# Patient Record
Sex: Female | Born: 1990 | Race: Black or African American | Hispanic: No | Marital: Single | Smoking: Current every day smoker
Health system: Southern US, Community
[De-identification: ages and names within clinical notes are randomized; demographics above are authoritative.]

## PROBLEM LIST (undated history)

## (undated) DIAGNOSIS — B379 Candidiasis, unspecified: Secondary | ICD-10-CM

## (undated) DIAGNOSIS — A599 Trichomoniasis, unspecified: Secondary | ICD-10-CM

## (undated) DIAGNOSIS — K859 Acute pancreatitis without necrosis or infection, unspecified: Secondary | ICD-10-CM

## (undated) DIAGNOSIS — O139 Gestational [pregnancy-induced] hypertension without significant proteinuria, unspecified trimester: Secondary | ICD-10-CM

## (undated) DIAGNOSIS — A6000 Herpesviral infection of urogenital system, unspecified: Secondary | ICD-10-CM

## (undated) DIAGNOSIS — E669 Obesity, unspecified: Secondary | ICD-10-CM

## (undated) DIAGNOSIS — O1410 Severe pre-eclampsia, unspecified trimester: Secondary | ICD-10-CM

## (undated) DIAGNOSIS — Z8619 Personal history of other infectious and parasitic diseases: Secondary | ICD-10-CM

## (undated) DIAGNOSIS — I10 Essential (primary) hypertension: Secondary | ICD-10-CM

## (undated) HISTORY — DX: Herpesviral infection of urogenital system, unspecified: A60.00

## (undated) HISTORY — DX: Personal history of other infectious and parasitic diseases: Z86.19

## (undated) HISTORY — DX: Candidiasis, unspecified: B37.9

## (undated) HISTORY — DX: Trichomoniasis, unspecified: A59.9

---
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
## 2008-01-28 ENCOUNTER — Encounter: Payer: Self-pay | Admitting: Family Medicine

## 2008-01-28 ENCOUNTER — Ambulatory Visit: Payer: Self-pay | Admitting: Family Medicine

## 2008-01-28 DIAGNOSIS — I10 Essential (primary) hypertension: Secondary | ICD-10-CM

## 2008-01-28 LAB — CONVERTED CEMR LAB: Hgb A1c MFr Bld: 5.8 %

## 2008-01-29 ENCOUNTER — Ambulatory Visit: Payer: Self-pay | Admitting: Family Medicine

## 2008-01-29 LAB — CONVERTED CEMR LAB
AST: 11 units/L (ref 0–37)
Albumin: 4.6 g/dL (ref 3.5–5.2)
Alkaline Phosphatase: 88 units/L (ref 39–117)
Calcium: 9.6 mg/dL (ref 8.4–10.5)
Chloride: 105 meq/L (ref 96–112)
Glucose, Bld: 90 mg/dL (ref 70–99)
Potassium: 4.2 meq/L (ref 3.5–5.3)
Sodium: 142 meq/L (ref 135–145)
Total Protein: 7.8 g/dL (ref 6.0–8.3)

## 2008-02-02 ENCOUNTER — Telehealth: Payer: Self-pay | Admitting: Family Medicine

## 2008-11-22 ENCOUNTER — Emergency Department (HOSPITAL_COMMUNITY): Admission: EM | Admit: 2008-11-22 | Discharge: 2008-11-22 | Payer: Self-pay | Admitting: Family Medicine

## 2009-02-19 ENCOUNTER — Encounter (INDEPENDENT_AMBULATORY_CARE_PROVIDER_SITE_OTHER): Payer: Self-pay | Admitting: *Deleted

## 2009-02-19 DIAGNOSIS — F17200 Nicotine dependence, unspecified, uncomplicated: Secondary | ICD-10-CM | POA: Insufficient documentation

## 2010-01-03 ENCOUNTER — Emergency Department (HOSPITAL_COMMUNITY): Admission: EM | Admit: 2010-01-03 | Discharge: 2010-01-03 | Payer: Self-pay | Admitting: Family Medicine

## 2010-01-18 ENCOUNTER — Ambulatory Visit: Payer: Self-pay | Admitting: Family Medicine

## 2010-02-01 ENCOUNTER — Telehealth: Payer: Self-pay | Admitting: Family Medicine

## 2010-03-29 ENCOUNTER — Telehealth: Payer: Self-pay | Admitting: Family Medicine

## 2010-04-12 ENCOUNTER — Encounter: Payer: Self-pay | Admitting: Family Medicine

## 2010-04-17 ENCOUNTER — Telehealth: Payer: Self-pay | Admitting: Family Medicine

## 2010-05-05 ENCOUNTER — Ambulatory Visit: Payer: Self-pay | Admitting: Family Medicine

## 2010-05-05 ENCOUNTER — Encounter: Payer: Self-pay | Admitting: Family Medicine

## 2010-05-05 LAB — CONVERTED CEMR LAB
AST: 11 units/L (ref 0–37)
Alkaline Phosphatase: 82 units/L (ref 39–117)
BUN: 9 mg/dL (ref 6–23)
Glucose, Bld: 97 mg/dL (ref 70–99)
Hemoglobin: 13.5 g/dL (ref 12.0–15.0)
MCHC: 32.8 g/dL (ref 30.0–36.0)
MCV: 95.8 fL (ref 78.0–100.0)
RBC: 4.29 M/uL (ref 3.87–5.11)
Sodium: 139 meq/L (ref 135–145)
Total Bilirubin: 0.4 mg/dL (ref 0.3–1.2)

## 2010-06-04 DIAGNOSIS — O1410 Severe pre-eclampsia, unspecified trimester: Secondary | ICD-10-CM

## 2010-06-04 HISTORY — DX: Severe pre-eclampsia, unspecified trimester: O14.10

## 2010-06-19 ENCOUNTER — Telehealth: Payer: Self-pay | Admitting: Family Medicine

## 2010-06-19 DIAGNOSIS — A6000 Herpesviral infection of urogenital system, unspecified: Secondary | ICD-10-CM | POA: Insufficient documentation

## 2010-06-28 ENCOUNTER — Ambulatory Visit: Admit: 2010-06-28 | Payer: Self-pay

## 2010-07-04 NOTE — Progress Notes (Signed)
Summary: refill Acyclovir   Phone Note Refill Request Call back at [phone]   Refills Requested: Medication #1:  ACYCLOVIR 400 MG TABS 1 tab by mouth three times a day  5 daysfor recurrence. Need refill on meds.  [facility]  Initial call taken by: Abundio Miu,  April 17, 2010 9:20 AM  Follow-up for Phone Call        Spoke with pt.  Discussed that I will send Rx in for this time but I would like to see her in the clinic.  We should discuss 1)suppresive therapy 2)why she is getting so many outbreaks, work up for immunocomp.  Pt agreed to make appt to see me, even if she does not have outbreak at time of appt.  Follow-up by: Cat Ta MD,  April 17, 2010 10:07 AM    Prescriptions: ACYCLOVIR 400 MG TABS (ACYCLOVIR) 1 tab by mouth three times a day  5 daysfor recurrence  #15 x 0   Entered and Authorized by:   Angeline Slim MD   Signed by:   Angeline Slim MD on 04/17/2010   Method used:   Faxed to ...       [facility] Department (retail)       [address] [place], [place]  [address]       Ph: [phone]       Fax: [phone]   RxID:   [id number]

## 2010-07-04 NOTE — Assessment & Plan Note (Signed)
Summary: f/u,df   Vital Signs:  Patient profile:   20 year old female Height:      69.25 inches Weight:      340 pounds BMI:     50.03 Temp:     98.0 degrees F oral BP sitting:   122 / 80  (left arm) Cuff size:   large  Vitals Entered By: Tessie Fass CMA (January 18, 2010 9:06 AM) CC: F/U Is Patient Diabetic? No Pain Assessment Patient in pain? no        Primary Care Provider:  Cat Ta MD  CC:  F/U.  History of Present Illness: 58 y/io F here for herpes  Was seen at Urgent Care 2 wks ago for a boil in vaginal area.  She was told that she had herpes and was given a Rx for valtrex.  She tested her for gonrrhea, chlamydia and they were negative. She has discussed with her partner and she is no longer having sex with this man.    Looking in Echart  Chlam -, GC -, RPR -, HIV -, Trich+, yeast+ She was given flagyl 1000mg  daily for two days but she did not take this. She was given Diflucan for yeast infection and she took this.  Denies anymore outbreaks. NO more symptoms of pain, irritation, discharge, fever, chills, n/v, abd pain.    Habits & Providers  Alcohol-Tobacco-Diet     Tobacco Status: current     Tobacco Counseling: to quit use of tobacco products     Cigarette Packs/Day: 0.5  Current Medications (verified): 1)  Flagyl 500 Mg Tabs (Metronidazole) .... Take Total of 4 Tablets By Mouth One Time. (You Already 2 Pills At Home) 2)  Valtrex 1 Gm Tabs (Valacyclovir Hcl) .Marland Kitchen.. 1 Tab By Mouth Three Times A Day X 7 Days At Next Outbreak  Allergies (verified): No Known Drug Allergies  Past History:  Family History: Last updated: 08/01/2006 Mother - DM, Sister - Asthma  Social History: Last updated: 08/28/2007 Graduated from [employer] in 2008.  Plans to go to [employer] school.  Lives with mother (Sarmi Pleasant), sister France Ravens Pleasant), and brother Gabriellia Rempel).1/2 pck per day /ETOH/drugs.  Sexually active since age 26  Risk Factors: Smoking Status:  current (01/18/2010) Packs/Day: 0.5 (01/18/2010)  Past Medical History: chlamydia 6/08 trichomoniasis 6/08 , 01/2010  Herpes 01/2010  Social History: Packs/Day:  0.5  Review of Systems       per hpi  Physical Exam  General:  Well-developed,well-nourished,in no acute distress; alert,appropriate and cooperative throughout examination. vitals reviewed.    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  I've sent rx to her pharmacy to have at hand for next outbreak. We discussed STDs and ways to protect herself.   Orders: [facility]- Est Level  3 ([address]644)  Problem # 2:  TRICHOMONIASIS (ICD-131.9) Assessment: New  Did not take all of flagyl.  Has flagyl 1000mg  left.  I've sent Rx for flagyl 1000mg  and instructed pt to take 2000mg  at one time.  Orders: [facility]- Est Level  3 (03474)  Problem # 3:  HIGH-RISK SEXUAL BEHAVIOR (ICD-V69.2) Assessment: Comment Only She will need exam for pap and contraceptive management.   Complete Medication List: 1)  Flagyl 500 Mg Tabs (Metronidazole) .... Take total of 4 tablets by mouth one time. (you already 2 pills at home) 2)  Valtrex 1 Gm Tabs (Valacyclovir hcl) .Marland Kitchen.. 1 tab by mouth three times a  day x 7 days at next outbreak  Patient Instructions: 1)  Please schedule an appointment with your primary doctor in 2-3 weeks for pap. 2)  Metronidazole (Flagyle): take 4 tablets today at one time 3)  Valtrex: take with next outbreak.  Call me if you have another outbreak.   Prescriptions: VALTREX 1 GM TABS (VALACYCLOVIR HCL) 1 tab by mouth three times a day x 7 days at next outbreak  #21 x 0   Entered and Authorized by:   Angeline Slim MD   Signed by:   Angeline Slim MD on 01/18/2010   Method used:   Faxed to ...       [facility] Department (retail)       [address] [place], [place]  [address]       Ph: [phone]       Fax: [phone]   RxID:    [id number] FLAGYL 500 MG TABS (METRONIDAZOLE) take total of 4 tablets by mouth one time. (you already 2 pills at home)  #2 x 0   Entered and Authorized by:   Angeline Slim MD   Signed by:   Angeline Slim MD on 01/18/2010   Method used:   Faxed to ...       [facility] Department (retail)       [address] [place], [place]  [address]       Ph: [phone]       Fax: [phone]   RxID:   [id number]

## 2010-07-04 NOTE — Progress Notes (Signed)
Summary: refill   Phone Note Refill Request Call back at Home Phone [phone] Message from:  Patient  Refills Requested: Medication #1:  ACYCLOVIR 400 MG TABS 1 tab by mouth three times a day  5 daysfor recurrence. [facility]  Initial call taken by: De Nurse,  March 29, 2010 9:21 AM    Prescriptions: ACYCLOVIR 400 MG TABS (ACYCLOVIR) 1 tab by mouth three times a day  5 daysfor recurrence  #15 x 0   Entered and Authorized by:   Angeline Slim MD   Signed by:   Angeline Slim MD on 03/29/2010   Method used:   Faxed to ...       [facility] Department (retail)       [address] [place], [place]  [address]       Ph: [phone]       Fax: [phone]   RxID:   [id number]

## 2010-07-04 NOTE — Assessment & Plan Note (Signed)
Summary: BP, obesity    Vital Signs:  Patient profile:   20 year old female Height:      69.25 inches Weight:      337.50 pounds BMI:     49.66 BSA:     2.59 Temp:     98.8 degrees F Pulse rate:   75 / minute BP sitting:   140 / 81  Vitals Entered By: Jone Baseman CMA (May 05, 2010 9:05 AM) CC: f/u elevated BP Is Patient Diabetic? No Pain Assessment Patient in pain? no        Primary Care Provider:  Cat Ta MD  CC:  f/u elevated BP.  History of Present Illness: 20 y/o F here for elevated BP, obesity, ?diabetes.  Elevated BP:  she has had elevated BP in past to 140s-150s, then she would have BP in the normal range 120s.  She is not taking any meds for HTN at this time.   No headache, chest pain, palpitations, no dyspnea, no claudication, no lightheadedness  DIABETES:  Pt is very concerned about diabetes.  She is morbidly obese and has a strong family history of DM.  Mother, mat grandma, mat aunts, sister all with diabetes.  No polyuria, polydipsia, no dysuria, abd pain, lightheadedness, no history of blood work showing elevated random glucose.    OBESITY BMI:  49.6 Weight difference since last OV: - 2 lbs Exercise:none        How often:       Diet: eats one meal a day, dinner.  She goes to school and would come home to do homework and sleep.  She has an AbLounger, but is not using it.   Starting gaining grade in 8-9th grade because she started Depo.  She has been in the 300lb range for so long, that she cannot recall how much weight she is gaining on average.    Tobacco use: 1/2 ppd.  Advised cessation.   Habits & Providers  Alcohol-Tobacco-Diet     Tobacco Status: current     Tobacco Counseling: to quit use of tobacco products     Cigarette Packs/Day: 0.5  Current Medications (verified): 1)  Acyclovir 400 Mg Tabs (Acyclovir) .Marland Kitchen.. 1 Tab By Mouth Three Times A Day  5 Daysfor Recurrence  Allergies (verified): No Known Drug Allergies  Past  History:  Family History: Last updated: 05/05/2010 Mother - DM, obesity Sister - Asthma, obesity Mat aunt: DM Mat grandmother: DM  Social History: Last updated: 05/05/2010 Graduated from [employer] in 2008.   Lives with mother (Sarmi Pleasant), sister France Ravens Pleasant), and brother Yarethzy Croak).1/2 pck per day /ETOH/drugs.  Sexually active since age 45  School: [employer], studying criminal justice.   Risk Factors: Smoking Status: current (05/05/2010) Packs/Day: 0.5 (05/05/2010)  Past Medical History: -chlamydia 6/08 -trichomoniasis 6/08 , 01/2010  -Herpes 01/2010 -Risky sexual behavior -Obesity -Elevated BP  Family History: Mother - DM, obesity Sister - Asthma, obesity Mat aunt: DM Mat grandmother: DM  Social History: Graduated from [employer] in 2008.   Lives with mother (Sarmi Pleasant), sister France Ravens Pleasant), and brother Crystalmarie Yasin).1/2 pck per day /ETOH/drugs.  Sexually active since age 40  School: [employer], studying criminal justice.   Review of Systems       per hpi   Physical Exam  General:  Well-developed,well-nourished,in no acute distress; alert,appropriate and cooperative throughout examination. vitals reviewed.  Lungs:  Normal respiratory effort, chest expands symmetrically. Lungs are clear to auscultation, no crackles or wheezes. Heart:  Normal rate and regular rhythm. S1 and S2 normal without gallop, murmur, click, rub or other extra sounds. Abdomen:  Bowel sounds positive,abdomen soft and non-tender without masses, organomegaly or hernias noted. obese  Pulses:  + 2 bilaterally  Extremities:  No clubbing, cyanosis, edema, or deformity noted with normal full range of motion of all joints.   Neurologic:  alert & oriented X3 and cranial nerves II-XII intact.   Psych:  Oriented X3, memory intact for recent and remote, normally interactive, good eye contact, not anxious appearing, not depressed appearing, and not agitated.     Impression &  Recommendations:  Problem # 1:  ELEVATED BLOOD PRESSURE WITHOUT DIAGNOSIS OF HYPERTENSION (ICD-796.2) Assessment Deteriorated BPs have ranged from 120s - 150s, but pt has been here very infrequently.  She is also young and morbidly obese.  I've asked her to return for nurse visits for BP checks.  Ideally pt would lose weight, which would help her BP, but given that BMI is 49.66 I don't know how much weight she will need to lose to lower BP into normal range.  Orders: Comp Met-[facility] ([id number]) CBC-[facility] ([address]) [facility]- Est  Level 4 (914[address])  Problem # 2:  OBESITY, NOS (ICD-278.00) Assessment: Unchanged Pt has weighed > 300 lbs since 2008.  Her mother and sister are also obese.  We spent a lot of time today discussing eating breakfast to jump start metabolism in the morning.  Discussed that eating 3 small meals per day is better than 1 large meal at night.  I've given pt handout on counting calories and also handout on a typical menu for 1800 calories per day.  We also discussed exercise.  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  Discussed playing physical/sports games on the Wii, like boxing or tennis.  I am trying to encourage her to get more active.  She was actually very excited about the boxing game, where she will expand a lot of energy and will move around a lot. I gave an example of a personal friend of mine who lost 50 lbs playing the boxing Wii game.  Pt was motivated by this.  She also said that she has the AbLounger at home and can work on this before going to bed.   Orders: Comp Met-[facility] [phone]) CBC-[facility] ([address]) TSH-[facility] [id number]) [facility]- Est  Level 4 (24401)  Problem # 3:  DIABETES MELLITUS, FAMILY HX (ICD-V18.0) Pt has strong family history of DM and obesity.  She is morbidly obese.   She has not eaten today.  We will check glucose.  If random glucose is elevated, will get A1C.  Her last A1C was 8/09,  5.8%  Orders: [facility]- Est  Level 4 ([address]214)  Complete Medication List: 1)  Acyclovir 400 Mg Tabs (Acyclovir) .Marland Kitchen.. 1 tab by mouth three times a day  5 daysfor recurrence  Other Orders: Flu Vaccine 73yrs + (02725) Admin 1st Vaccine (36644)  Patient Instructions: 1)  Please schedule a follow-up appointment in 4-5 for ?diabetes and weight. 2)  Make appointment to see nurse for blood pressure check.  3)  Do the Ablounger 10 minutes per day. 4)  Paly Wii sports 10 minutes per day.   5)  Try to eat breakfast.   Orders Added: 1)  Comp Met-[facility] [[id number]] 2)  CBC-[facility] [85027] 3)  TSH-[facility] [[id number]] 4)  [facility]- Est  Level 4 [38756] 5)  Flu Vaccine 27yrs + [90658] 6)  Admin 1st Vaccine [90471]   Immunizations Administered:  Influenza Vaccine # 1:    Vaccine Type: Fluvax 3+    Site: right deltoid    Mfr: GlaxoSmithKline    Dose: 0.5 ml    Route: IM    Given by: Jone Baseman CMA    Exp. Date: 12/02/2010    Lot #: [id number]    VIS given: 12/27/09 version given May 05, 2010.  Flu Vaccine Consent Questions:    Do you have a history of severe allergic reactions to this vaccine? no    Any prior history of allergic reactions to egg and/or gelatin? no    Do you have a sensitivity to the preservative Thimersol? no    Do you have a past history of Guillan-Barre Syndrome? no    Do you currently have an acute febrile illness? no    Have you ever had a severe reaction to latex? no    Vaccine information given and explained to patient? yes    Are you currently pregnant? no   Immunizations Administered:  Influenza Vaccine # 1:    Vaccine Type: Fluvax 3+    Site: right deltoid    Mfr: GlaxoSmithKline    Dose: 0.5 ml    Route: IM    Given by: Jone Baseman CMA    Exp. Date: 12/02/2010    Lot #: [id number]    VIS given: 12/27/09 version given May 05, 2010.

## 2010-07-04 NOTE — Progress Notes (Signed)
  Medications Added ACYCLOVIR 400 MG TABS (ACYCLOVIR) 1 tab by mouth three times a day  5 daysfor recurrence       Phone Note Call from Patient   Caller: Patient Summary of Call: pt need refill for valtrex called in to [facility] pharmacy.   Initial call taken by: Britta Mccreedy mcgregor  Follow-up for Phone Call         spoke with patient and she is taking the RX  that was sent in on 01/18/2010. she wants to have another on hand just in case she needs it again so she doesn't have to wait.    will forward  message to MD.  Follow-up by: Theresia Lo RN,  February 01, 2010 11:58 AM    New/Updated Medications: ACYCLOVIR 400 MG TABS (ACYCLOVIR) 1 tab by mouth three times a day  5 daysfor recurrence Prescriptions: ACYCLOVIR 400 MG TABS (ACYCLOVIR) 1 tab by mouth three times a day  5 daysfor recurrence  #15 x 0   Entered and Authorized by:   Angeline Slim MD   Signed by:   Angeline Slim MD on 02/01/2010   Method used:   Faxed to ...       [facility] Department (retail)       [address] [place], [place]  [address]       Ph: [phone]       Fax: [phone]   RxID:   [id number] VALTREX 1 GM TABS (VALACYCLOVIR HCL) 1 tab by mouth three times a day x 7 days at next outbreak  #21 x 0   Entered and Authorized by:   Angeline Slim MD   Signed by:   Angeline Slim MD on 02/01/2010   Method used:   Electronically to        [facility]. [address] [phone]* (retail)       [address]       [place], [place]  [address]       Ph: [phone] or [phone]       Fax: [phone]   RxID:   [id number]  If pt has another outbreak soon, then she needs to be seen.  Cat Ta MD  February 01, 2010 1:29 PM   above rx cancelled at [facility] because patient wants rx sent to [facility] dept pharmacy.  tried to call RX in but pharmacist states they do not have valtrex. they only have Acyclovir . will send to MD to please change and I will call [facility] pharmacy back. last time she had it filled they happened  to have samples that they gave her. Theresia Lo RN  February 01, 2010 2:22 PM   called pharmacy and they did receive Rx for Acyclovir. Theresia Lo RN  February 02, 2010 9:31 AM

## 2010-07-04 NOTE — Miscellaneous (Signed)
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) Removed problem of CHLAMYDIAL INFECTION (ICD-099.41) Removed problem of SCREENING FOR MALIGNANT NEOPLASM OF THE CERVIX (ICD-V76.2) Removed problem of PHYSICAL EXAMINATION, NORMAL (ICD-V70.0) Removed problem of History of  VAGINAL TRICHOMONIASIS (ICD-131.01) Observations: Added new observation of PAST MED HX: -chlamydia 6/08 -trichomoniasis 6/08 , 01/2010  -Herpes 01/2010 -Risky sexual behavior -Obesity (04/12/2010 22:04) Added new observation of PRIMARY MD: Donne Baley MD (04/12/2010 22:04)       Past History:  Past Medical History: -chlamydia 6/08 -trichomoniasis 6/08 , 01/2010  -Herpes 01/2010 -Risky sexual behavior -Obesity

## 2010-07-06 NOTE — Progress Notes (Signed)
Summary: refill   Phone Note Refill Request Call back at [phone] Message from:  Patient  Refills Requested: Medication #1:  ACYCLOVIR 400 MG TABS 1 tab by mouth three times a day  5 daysfor recurrence. Initial call taken by: De Nurse,  June 19, 2010 10:16 AM  Follow-up for Phone Call        Called pt back.  This is the third time she has asked for Acyclovir refill, but I have not treated pt for this.  I really need to discuss suppressive therapy if pt continues to get outbreaks.   She cannot come in to see me this week, so will see me next Wed at 9:30.  Will refill med.  Follow-up by: Colbi Schiltz MD,  June 19, 2010 10:50 AM  New Problems: GENITAL HERPES (ICD-054.10)   New Problems: GENITAL HERPES (ICD-054.10) Prescriptions: ACYCLOVIR 400 MG TABS (ACYCLOVIR) 1 tab by mouth three times a day  5 daysfor recurrence  #15 x 0   Entered and Authorized by:   Angeline Slim MD   Signed by:   Angeline Slim MD on 06/19/2010   Method used:   Faxed to ...       [facility] Department (retail)       [address] [place], [place]  [address]       Ph: [phone]       Fax: [phone]   RxID:   [id number] ACYCLOVIR 400 MG TABS (ACYCLOVIR) 1 tab by mouth three times a day  5 daysfor recurrence  #15 x 0   Entered and Authorized by:   Angeline Slim MD   Signed by:   Angeline Slim MD on 06/19/2010   Method used:   Print then Give to Patient   RxID:   [id number]

## 2010-07-20 ENCOUNTER — Ambulatory Visit (INDEPENDENT_AMBULATORY_CARE_PROVIDER_SITE_OTHER): Payer: Self-pay | Admitting: Family Medicine

## 2010-07-20 ENCOUNTER — Encounter: Payer: Self-pay | Admitting: Family Medicine

## 2010-07-20 VITALS — BP 160/68 | HR 71 | Temp 99.0°F | Wt 342.0 lb

## 2010-07-20 DIAGNOSIS — A6000 Herpesviral infection of urogenital system, unspecified: Secondary | ICD-10-CM

## 2010-07-20 DIAGNOSIS — R030 Elevated blood-pressure reading, without diagnosis of hypertension: Secondary | ICD-10-CM

## 2010-07-20 NOTE — Patient Instructions (Signed)
Please come back in 1 month for blood pressure check.   We are watching your blood pressure because the last two blood pressures when you were in the clinic were high. When you come back next month and your blood pressure is still high, we may need to start a blood pressure medicine.  Try to add exercise to your daily routine.  I know that with school and work your time is limited.  You can do things like parking far away and walking to job or class.  We've also discussed using the ab lounger while you are watching TV.

## 2010-07-20 NOTE — Assessment & Plan Note (Signed)
Pt with hx of genital herpes since 2010.  She is currently asymptomatic.  She had 2 outbreaks during 2011, treated with Acyclovir.  She states today that the last last she called our clinic for a refill of Acyclovir, the outbreak went away on its own.  She thinks that she gets razor burns after shaving and has been mistaking it for herpes outbreak.  We discussed that genital herpes would look and fee like (painful).  Pt has been online to look at pictures.  Plan: will not start suppressive therapy now.  Pt has Acyclovir for prn use.  If she has more frequent outbreaks will start suppressive therapy. She is agreeable to this plan.

## 2010-07-20 NOTE — Progress Notes (Signed)
  Subjective:    Patient ID: Andrea Morton, female    DOB: 12/30/1990, 20 y.o.   MRN: [id number]  HPI GENITAL HERPES Charles is here to discuss genital herpes outbreak. She has been having outbreaks since 2010.  In 2011 she had a total of 2 outbreaks.  Pt states that she has been shaving in genital area and about 2 days later she would have an outbreak.  She describes each outbreak as pruritic rash.  She states that the outbreak would go away on its own in a couple of days.  She talked to her mother about this and her mother thinks that she is having razor burns vs herpes outbreak.  She has stopped shaving and there has been no outbreaks since.    ELEVATED BP BP elevated today.  Was not able to recheck with manual cuff as her arm was too large.    She denies fever, chills, rash, chest pain, dyspnea on exertion, syncope, palpitations, LE edema, cough, abd pain, N/V, constipation, diarrhea.   Review of Systems    As per hpi  Objective:   Physical Exam GEN: NAD, alert, cooperative HEENT: [place]/AT, MMM, no neck masses RESP: CTA b/l no wheezing CVS: RRR, soft S1S2, no murmurs/gallops ABD: Soft, +BS, no distention, no pain EXT: no c/c/e Neuro: nonfocal       Assessment & Plan:

## 2010-07-20 NOTE — Assessment & Plan Note (Signed)
BP today is 160/68 (attempted manual recheck but cuff was too small) and 140/81 in Dec.  Pt is only 20 y/o but she is morbidly obese with BMI 46.  We have discussed exercise in the past, but pt has not implemented a routine.  She has family history of obesity and her sister has hypertension.  She has a strong fm hx of DM.  She will come back in one month for BP check and if BP still elevated, will need to start HCTZ 12.5mg  and also get EKG.  Pt amendable to this plan.

## 2010-08-18 ENCOUNTER — Ambulatory Visit: Payer: Self-pay | Admitting: Family Medicine

## 2010-08-18 LAB — GC/CHLAMYDIA PROBE AMP, GENITAL: Chlamydia, DNA Probe: NEGATIVE

## 2010-08-18 LAB — RPR: RPR Ser Ql: NONREACTIVE

## 2010-08-18 LAB — HERPES SIMPLEX VIRUS CULTURE

## 2010-08-18 LAB — WET PREP, GENITAL

## 2010-09-06 ENCOUNTER — Ambulatory Visit: Payer: Self-pay | Admitting: Family Medicine

## 2010-10-11 ENCOUNTER — Ambulatory Visit (INDEPENDENT_AMBULATORY_CARE_PROVIDER_SITE_OTHER): Payer: Self-pay | Admitting: Family Medicine

## 2010-10-11 ENCOUNTER — Encounter: Payer: Self-pay | Admitting: Family Medicine

## 2010-10-11 VITALS — BP 133/74 | HR 72 | Temp 98.1°F | Ht 69.25 in | Wt 327.0 lb

## 2010-10-11 DIAGNOSIS — R21 Rash and other nonspecific skin eruption: Secondary | ICD-10-CM | POA: Insufficient documentation

## 2010-10-11 MED ORDER — HYDROCORTISONE 1 % EX OINT: TOPICAL_OINTMENT | Freq: Two times a day (BID) | CUTANEOUS | Status: DC

## 2010-10-11 MED ORDER — HYDROXYZINE HCL 10 MG PO TABS: 10.0000 mg | ORAL_TABLET | Freq: Three times a day (TID) | ORAL | Status: AC | PRN

## 2010-10-11 NOTE — Progress Notes (Signed)
  Subjective:    Patient ID: Andrea Morton, female    DOB: Jul 20, 1990, 20 y.o.   MRN: [id number]  HPI  Rash: Patient complains of rash involving the bilateral arm, groin and torso. Rash started 1 and half weeks ago. Appearance of rash at onset: Other appearance: "little bumps" that are flesh colored.  Pt has "pop" some and saw pus. Rash has not changed over time Initial distribution: abdomen, bilateral arm and bilateral buttock.  Discomfort associated with rash: is pruritic.  Associated symptoms: none. Denies: abdominal pain, cough, fever, headache, nausea, sore throat and vomiting. Patient has not had previous evaluation of rash. Patient has had previous treatment (alcohol rubs, neosporin cream).  Response to treatment: none. Patient has not had contacts with similar rash. Patient has not identified precipitant. Patient has had new exposure to Caress body wash (used a month ago).  Rash started 2 wks ago.  She stopped using the body wash as soon as she saw the rash. Rash is worse at night.  No rash on belt line.  Mother lives in the same house and not complaining of rash.   Review of Systems Per hpi     Objective:   Physical Exam  Constitutional: She appears well-developed and well-nourished. No distress.  Skin: Skin is warm and dry. Rash noted. No ecchymosis and no purpura noted. Rash is macular, papular, nodular and pustular. Rash is not vesicular.          Left hand with 1-7mm lesions on wrist and interdigital areas.  No lesions on feet/ankles.  These lesions are flesh colored.  NO redness, no pustules, no ecchymosis.           Assessment & Plan:

## 2010-10-11 NOTE — Patient Instructions (Signed)
Scabies   Scabies are small bugs (mites) that burrow under the skin and cause red bumps and severe itching. These bugs can only be seen with a microscope.   Scabies are highly contagious. They can spread easily from person to person by direct contact. They are also spread through sharing clothing or linens that have the scabies mites living in them. It is not unusual for an entire family to become infected through shared towels, clothing, or bedding.   HOME CARE INSTRUCTIONS   Your caregiver may prescribe a cream or lotion to kill the mites. If this cream is prescribed; massage the cream into the entire area of the body from the neck to the bottom of both feet. Also massage the cream into the scalp and face if your child is less than 1 year old. Avoid the eyes and mouth.   Leave the cream on for 8 to12 hours. DO NOT wash your hands after application. Your child should bathe or shower after the 8 to 12 hour application period. Sometimes it is helpful to apply the cream to your child at right before bedtime.   One treatment is usually effective and will eliminate approximately 95% of infestations. For severe cases, your caregiver may decide to repeat the treatment in 1 week. Everyone in your household should be treated with one application of the cream.   New rashes or burrows should not appear after successful treatment within 24 to 48 hours; however the itching and rash may last for 2 to 4 weeks after successful treatment. If your symptoms persist longer than this, see your caregiver.   Your caregiver also may prescribe a medication to help with the itching or to help the rash go away more quickly.   Scabies can live on clothing or linens for up to 3 days. Your entire child’s recently used clothing, towels, stuffed toys, and bed linens should be washed in hot water and then dried in a dryer for at least 20 minutes on high heat. Items that cannot be washed should be enclosed in a plastic bag for at least 3 days.   To  help relieve itching, bathe your child in a COOL bath or apply cool washcloths to the affected areas.   Your child may return to school after treatment with the prescribed cream.   SEEK MEDICAL CARE IF:   The itching persists longer than 4 weeks after treatment.   The rash spreads or becomes infected (the area has red blisters or yellow-tan crust).   Document Released: 05/21/2005 Document Re-Released: 10/07/2008   ExitCare® Patient Information ©2011 [employer].

## 2010-10-11 NOTE — Assessment & Plan Note (Signed)
Rash may be from discrete insect bites vs scabies.  Since pt has lesions on wrist and interdigital space of hands (none on waist line or feet) it may be from scabies, but pt lives with her mother, who is not complaining of rash.  Will try hydroxyzine and hydrocortisone cream.  If no improvement or if mom also complains of rash, pt can call me back and  I will Rx Elimite for her.  Gave handout on scabies for pt to read.

## 2010-10-13 ENCOUNTER — Telehealth: Payer: Self-pay | Admitting: Family Medicine

## 2010-10-13 MED ORDER — PERMETHRIN 5 % EX CREA: TOPICAL_CREAM | Freq: Once | CUTANEOUS | Status: AC

## 2010-10-13 NOTE — Telephone Encounter (Signed)
Rx Elimite. Tried calling all 3 phone numbers for pt but none are working.  Rx sent to [facility] on [address]

## 2010-10-13 NOTE — Telephone Encounter (Signed)
Called and left message for patient to return call, which medication does she want switched? She can call pharmacy and have meds switched.Breck Maryland, Rodena Medin

## 2010-10-13 NOTE — Telephone Encounter (Signed)
Patient is still itching, even worse than yesterday.  She's pretty certain that it is scabies.  She would like something called in to the [facility] on [address].

## 2010-10-13 NOTE — Telephone Encounter (Signed)
Tried calling to let pt know that I've sent a Rx for Elimite for scabies.  Phone number is not in service.

## 2010-10-13 NOTE — Telephone Encounter (Signed)
Pt want rx to be sent to [facility] dept pharm cause [facility] too high.  Pt will have them fax request

## 2010-10-13 NOTE — Telephone Encounter (Signed)
Pt was given pills & cream for scabies, pt says she is itching worse & thinks the med she was given is for itching & wants something that is going to kill the scabies.

## 2010-10-16 NOTE — Telephone Encounter (Signed)
Pt calling back, it was taken care of but pt needs a refill on the elimite, says she used the whole tube this weekend. Pt wants to get it at the [facility] de[facility].

## 2010-10-27 ENCOUNTER — Telehealth: Payer: Self-pay | Admitting: Family Medicine

## 2010-10-27 ENCOUNTER — Ambulatory Visit (INDEPENDENT_AMBULATORY_CARE_PROVIDER_SITE_OTHER): Payer: Self-pay | Admitting: Family Medicine

## 2010-10-27 ENCOUNTER — Other Ambulatory Visit: Payer: Self-pay | Admitting: Family Medicine

## 2010-10-27 ENCOUNTER — Encounter: Payer: Self-pay | Admitting: Family Medicine

## 2010-10-27 ENCOUNTER — Other Ambulatory Visit (HOSPITAL_COMMUNITY)
Admission: RE | Admit: 2010-10-27 | Discharge: 2010-10-27 | Disposition: A | Discharge status: Home / Self Care | Payer: Self-pay | Source: Ambulatory Visit | Attending: Family Medicine | Admitting: Family Medicine

## 2010-10-27 VITALS — BP 126/76 | HR 80 | Temp 98.8°F | Ht 69.25 in | Wt 329.0 lb

## 2010-10-27 DIAGNOSIS — Z331 Pregnant state, incidental: Secondary | ICD-10-CM

## 2010-10-27 DIAGNOSIS — R109 Unspecified abdominal pain: Secondary | ICD-10-CM | POA: Insufficient documentation

## 2010-10-27 DIAGNOSIS — R87810 Cervical high risk human papillomavirus (HPV) DNA test positive: Secondary | ICD-10-CM | POA: Insufficient documentation

## 2010-10-27 DIAGNOSIS — N912 Amenorrhea, unspecified: Secondary | ICD-10-CM

## 2010-10-27 DIAGNOSIS — O26899 Other specified pregnancy related conditions, unspecified trimester: Secondary | ICD-10-CM

## 2010-10-27 DIAGNOSIS — Z124 Encounter for screening for malignant neoplasm of cervix: Secondary | ICD-10-CM | POA: Insufficient documentation

## 2010-10-27 DIAGNOSIS — R87610 Atypical squamous cells of undetermined significance on cytologic smear of cervix (ASC-US): Secondary | ICD-10-CM

## 2010-10-27 DIAGNOSIS — K3189 Other diseases of stomach and duodenum: Secondary | ICD-10-CM

## 2010-10-27 LAB — POCT WET PREP (WET MOUNT): Yeast Wet Prep HPF POC: NEGATIVE

## 2010-10-27 LAB — POCT URINALYSIS DIPSTICK
Protein, UA: 30
Spec Grav, UA: 1.025
Urobilinogen, UA: 2

## 2010-10-27 MED ORDER — PRENATAL RX 60-1 MG PO TABS: 1.0000 | ORAL_TABLET | Freq: Every day | ORAL | Status: DC

## 2010-10-27 MED ORDER — METRONIDAZOLE 500 MG PO TABS: 500.0000 mg | ORAL_TABLET | Freq: Two times a day (BID) | ORAL | Status: AC

## 2010-10-27 NOTE — Telephone Encounter (Signed)
Tried calling pt at home. No answer.  Voicemail does not indicate pt's name.  Tried calling mobile #, but that was disconnected.  Tried work number. Will send Rx to her pharmacy.  Will send letter to home.

## 2010-10-27 NOTE — Progress Notes (Signed)
Addended by: Frimy Uffelman P on: 10/27/2010 02:31 PM   Modules accepted: Orders

## 2010-10-27 NOTE — Progress Notes (Signed)
  Subjective:    Patient ID: Andrea Morton, female    DOB: [birth year], 20 y.o.   MRN: [id number]  HPI  Abd pain x 1 week Location: "all over".   Description: cramping and at worse is 7 out of 10. It does not radiate. It has improved.   Nausea x 1 wk.  Vomiting 2x yesterday, 1x 6 days ago.   No diarrhea, constipation, fever, chill.  +frequency. -dysuria.  -hematuria. +vaginal discharge.   She has not tried any medications for pain. No travel history.   Last sexual intercourse end of April to May.  Partner has been together for 1-2 yrs. Does not use condoms consistently.  LMP: 09/15/10   Review of Systems Per hpi     Objective:   Physical Exam Gen: nad, alert, appropriate. Pt sitting calmly on table. Abd: morbidly obese. +BS, mild tenderness to deep palpation, mild generalized tenderness to deep palpation. No guarding. No rebound.  GU: No external lesions. No vaginal bleeding. Cervical os closed.  +whitish brown vaginal discharge. No adnexal mass or tenderness. No CMT.   Back: no tenderness   EXT: no edema.         Assessment & Plan:

## 2010-10-27 NOTE — Assessment & Plan Note (Signed)
Pt with abd pain x 1 week with +nausea and vomiting yesterday and 6 days ago.  Exam showed mild abd tenderness to palpation. Pt not in distress.  Pt has +UPT with LMP 09/15/10. She is not sure if she will keep the pregnancy.  On pelvic exam she did not have CMT and cervical os was closed. We took cultures for GC/Chlm and wet prep. We are also checking UA and UCx. Will send pt to [facility] for TVUS.  Will start PNV.  Discussed applying for Medicaid for Pregnancy if pt chooses to keep baby.  Pt to schedule appt for prenatal labs if she would like to keep baby.  We also did PAP today since pt is 19 and she is unsure about keeping baby.  We will f/u labs and treat accordingly.  Pt advised to take tylenol for pain but to come to MAU if there is bleeding or worsening pain.

## 2010-10-27 NOTE — Patient Instructions (Signed)
Please keep appt with ultrasound. You can take Tylenol for pain. Geologist, engineering for [employer]. Make appt at [facility] for prenatal care/labs. I have sent a Rx for prenatal vitamin for you to take daily.  Please go to [facility] for bleeding or worsening pain.

## 2010-10-30 LAB — URINE CULTURE

## 2010-11-02 ENCOUNTER — Telehealth: Payer: Self-pay | Admitting: Family Medicine

## 2010-11-02 ENCOUNTER — Encounter: Payer: Self-pay | Admitting: *Deleted

## 2010-11-02 ENCOUNTER — Other Ambulatory Visit (HOSPITAL_COMMUNITY): Payer: Self-pay

## 2010-11-02 ENCOUNTER — Ambulatory Visit (HOSPITAL_COMMUNITY)
Admission: RE | Admit: 2010-11-02 | Discharge: 2010-11-02 | Disposition: A | Discharge status: Home / Self Care | Payer: Self-pay | Source: Ambulatory Visit | Attending: Family Medicine | Admitting: Family Medicine

## 2010-11-02 DIAGNOSIS — O98519 Other viral diseases complicating pregnancy, unspecified trimester: Secondary | ICD-10-CM | POA: Insufficient documentation

## 2010-11-02 DIAGNOSIS — O99330 Smoking (tobacco) complicating pregnancy, unspecified trimester: Secondary | ICD-10-CM | POA: Insufficient documentation

## 2010-11-02 DIAGNOSIS — A6000 Herpesviral infection of urogenital system, unspecified: Secondary | ICD-10-CM | POA: Insufficient documentation

## 2010-11-02 NOTE — Telephone Encounter (Signed)
Pt returning MDs call, wants to know what the rx is for?

## 2010-11-02 NOTE — Telephone Encounter (Signed)
Called pt and informed of neg STD screen and also read letter to pt from Dr.Ta .Arlyss Repress

## 2010-11-03 ENCOUNTER — Encounter: Payer: Self-pay | Admitting: Family Medicine

## 2010-11-07 ENCOUNTER — Encounter: Payer: Self-pay | Admitting: Family Medicine

## 2010-11-07 ENCOUNTER — Telehealth: Payer: Self-pay | Admitting: Family Medicine

## 2010-11-07 NOTE — Telephone Encounter (Signed)
Called pt and lmvm to pick up letter. Lorenda Hatchet, Renato Battles

## 2010-11-07 NOTE — Telephone Encounter (Signed)
Pt requesting proof of pregnancy letter to take to wic & to apply for medicaid

## 2010-11-09 ENCOUNTER — Other Ambulatory Visit: Payer: Self-pay

## 2010-11-10 ENCOUNTER — Encounter: Payer: Self-pay | Admitting: Family Medicine

## 2010-11-21 ENCOUNTER — Other Ambulatory Visit: Payer: Self-pay

## 2010-11-21 DIAGNOSIS — Z331 Pregnant state, incidental: Secondary | ICD-10-CM

## 2010-11-21 NOTE — Progress Notes (Signed)
Prenatal labs done today Andrea Morton 

## 2010-11-22 LAB — OBSTETRIC PANEL
Eosinophils Relative: 2 % (ref 0–5)
HCT: 37.3 % (ref 36.0–46.0)
Lymphocytes Relative: 27 % (ref 12–46)
Lymphs Abs: 2.6 10*3/uL (ref 0.7–4.0)
MCV: 94 fL (ref 78.0–100.0)
Neutro Abs: 6.5 10*3/uL (ref 1.7–7.7)
Platelets: 301 10*3/uL (ref 150–400)
RBC: 3.97 MIL/uL (ref 3.87–5.11)
Rubella: 12.3 IU/mL — ABNORMAL HIGH
WBC: 9.6 10*3/uL (ref 4.0–10.5)

## 2010-11-22 LAB — HIV ANTIBODY (ROUTINE TESTING W REFLEX): HIV: NONREACTIVE

## 2010-11-24 ENCOUNTER — Encounter: Payer: Self-pay | Admitting: Family Medicine

## 2010-11-27 ENCOUNTER — Telehealth: Payer: Self-pay | Admitting: Family Medicine

## 2010-11-27 ENCOUNTER — Other Ambulatory Visit: Payer: Self-pay | Admitting: Family Medicine

## 2010-11-27 ENCOUNTER — Encounter: Payer: Self-pay | Admitting: Family Medicine

## 2010-11-27 ENCOUNTER — Ambulatory Visit (INDEPENDENT_AMBULATORY_CARE_PROVIDER_SITE_OTHER): Payer: Medicaid Other | Admitting: Family Medicine

## 2010-11-27 DIAGNOSIS — R109 Unspecified abdominal pain: Secondary | ICD-10-CM

## 2010-11-27 DIAGNOSIS — B3731 Acute candidiasis of vulva and vagina: Secondary | ICD-10-CM

## 2010-11-27 DIAGNOSIS — N760 Acute vaginitis: Secondary | ICD-10-CM

## 2010-11-27 DIAGNOSIS — B373 Candidiasis of vulva and vagina: Secondary | ICD-10-CM

## 2010-11-27 LAB — POCT WET PREP (WET MOUNT)
Clue Cells Wet Prep HPF POC: NEGATIVE
WBC, Wet Prep HPF POC: >20

## 2010-11-27 MED ORDER — ACYCLOVIR 400 MG PO TABS: 400.0000 mg | ORAL_TABLET | Freq: Three times a day (TID) | ORAL | Status: DC

## 2010-11-27 MED ORDER — CLOTRIMAZOLE 100 MG VA TABS: 1.0000 | ORAL_TABLET | Freq: Every day | VAGINAL | Status: DC

## 2010-11-27 MED ORDER — ONDANSETRON 4 MG PO TBDP: 4.0000 mg | ORAL_TABLET | Freq: Three times a day (TID) | ORAL | Status: AC | PRN

## 2010-11-27 NOTE — Assessment & Plan Note (Addendum)
Abdominal pain with nausea and vomiting in a 77 week old pregnancy.  Will get BHCG today and repeat in 48 hours.  Will have low threshhold for getting early [place].  Will treat with Acyclovir as pt feels like she is having a breakout x 2 days. No ulcerations seen.  Will have pt make appointment for new OB visit on the way out.  Will send in Rx fof Zofran so the patient can keep down her medications and food.

## 2010-11-27 NOTE — Patient Instructions (Signed)
You are getting a blood test to see if the baby is growing today.  You need to come back in 48 hours to get the blood test repeated.  Please make a new OB appointment today and a lab appointment for 48 hours from now to get your blood tested.

## 2010-11-27 NOTE — Telephone Encounter (Signed)
lvm for pt.Andrea Morton  

## 2010-11-27 NOTE — Telephone Encounter (Signed)
I left a message stating that she needs to call back and speak to my nurse.  She has a bad yeast infection and needs to be treated. I have already send in a vaginal suppository to the pharmacy (the suppositories are safer in early pregnancy).  Please let the patient know if she calls back.

## 2010-11-27 NOTE — Assessment & Plan Note (Signed)
Pt has a lot of yeast seen on wet prep. Plan to treat with vaginal clotrimazole.

## 2010-11-27 NOTE — Progress Notes (Signed)
Pt is having some lower abdominal pain in the middle and left of her lower abdomen. She is having vomiting at all times of the day. She is still keeping down some fluids and having normal stools. She says that she has had the vomiting since just after she found out she was pregnant. She thought the Flagyl was causing it so she stopped taking her medications. She is not taking the prenatal vitamins either because she says that they make her vomit. She has no blood in her stool or urine. Her pain is not severe.  ROS: neg except as noted above and she feels like she is getting a herpes outbreak.  PE:  Gen: sitting comfrotably on the table.  Abd: some tenderness to palpation suprapubic and LLQ and also LUQ, no guarding, no rigidity GU: copious amount of cheesy vaginal discharge, small left labia minora bumps that do not have ulceration but patient reports as being tender.

## 2010-11-28 ENCOUNTER — Telehealth: Payer: Self-pay | Admitting: Family Medicine

## 2010-11-28 ENCOUNTER — Inpatient Hospital Stay (HOSPITAL_COMMUNITY)
Admission: EM | Admit: 2010-11-28 | Discharge: 2010-11-29 | Disposition: A | Discharge status: Home / Self Care | Payer: Medicaid Other | Source: Ambulatory Visit | Attending: Obstetrics and Gynecology | Admitting: Obstetrics and Gynecology

## 2010-11-28 DIAGNOSIS — O210 Mild hyperemesis gravidarum: Secondary | ICD-10-CM | POA: Insufficient documentation

## 2010-11-28 LAB — HCG, QUANTITATIVE, PREGNANCY: hCG, Beta Chain, Quant, S: 46714.9 m[IU]/mL

## 2010-11-28 NOTE — Telephone Encounter (Signed)
Spoke with patient and informed.

## 2010-11-28 NOTE — Telephone Encounter (Signed)
Andrea Morton returned your call but was unable to speak with nurse team.  Please call her back at new number left for info.

## 2010-11-28 NOTE — Telephone Encounter (Signed)
LVM for patient to call back. ?

## 2010-11-29 ENCOUNTER — Other Ambulatory Visit: Payer: Medicaid Other

## 2010-11-29 DIAGNOSIS — Z331 Pregnant state, incidental: Secondary | ICD-10-CM

## 2010-11-29 LAB — URINALYSIS, ROUTINE W REFLEX MICROSCOPIC
Bilirubin Urine: NEGATIVE
Glucose, UA: NEGATIVE mg/dL
Ketones, ur: 15 mg/dL — AB
Protein, ur: NEGATIVE mg/dL
pH: 5.5 (ref 5.0–8.0)

## 2010-11-29 LAB — URINE MICROSCOPIC-ADD ON

## 2010-11-29 NOTE — Progress Notes (Signed)
b-hcg done today Andrea Morton 

## 2010-11-30 LAB — HCG, QUANTITATIVE, PREGNANCY: hCG, Beta Chain, Quant, S: 50647.4 m[IU]/mL

## 2010-12-12 ENCOUNTER — Telehealth: Payer: Self-pay | Admitting: Emergency Medicine

## 2010-12-12 NOTE — Telephone Encounter (Signed)
Fwd to Dr.Booth to address .Andrea Morton

## 2010-12-12 NOTE — Telephone Encounter (Signed)
Has never gotten her lab results.  Would like someone to call her with those results please.

## 2010-12-14 ENCOUNTER — Telehealth: Payer: Self-pay | Admitting: Family Medicine

## 2010-12-14 NOTE — Telephone Encounter (Signed)
Please call number left in msg for lab results.  Pt do not want her mom called at the [phone] number.

## 2010-12-15 NOTE — Telephone Encounter (Signed)
Called and let patient know that HCG quants were OK.  She is not having any more abdominal pain or vomiting.  Pt scheduled for initial OB visit 7/17, with me.  Reminded her, and she says she will keep the appointment.

## 2010-12-19 ENCOUNTER — Ambulatory Visit (INDEPENDENT_AMBULATORY_CARE_PROVIDER_SITE_OTHER): Payer: Medicaid Other | Admitting: Family Medicine

## 2010-12-19 ENCOUNTER — Encounter: Payer: Self-pay | Admitting: Family Medicine

## 2010-12-19 ENCOUNTER — Inpatient Hospital Stay (HOSPITAL_COMMUNITY)
Admission: EM | Admit: 2010-12-19 | Discharge: 2010-12-19 | Disposition: A | Discharge status: Home / Self Care | Payer: Medicaid Other | Source: Ambulatory Visit | Attending: Obstetrics & Gynecology | Admitting: Obstetrics & Gynecology

## 2010-12-19 ENCOUNTER — Encounter (HOSPITAL_COMMUNITY): Payer: Self-pay

## 2010-12-19 DIAGNOSIS — O209 Hemorrhage in early pregnancy, unspecified: Secondary | ICD-10-CM

## 2010-12-19 DIAGNOSIS — Z3480 Encounter for supervision of other normal pregnancy, unspecified trimester: Secondary | ICD-10-CM

## 2010-12-19 DIAGNOSIS — R030 Elevated blood-pressure reading, without diagnosis of hypertension: Secondary | ICD-10-CM | POA: Insufficient documentation

## 2010-12-19 DIAGNOSIS — O9989 Other specified diseases and conditions complicating pregnancy, childbirth and the puerperium: Secondary | ICD-10-CM

## 2010-12-19 DIAGNOSIS — O99891 Other specified diseases and conditions complicating pregnancy: Secondary | ICD-10-CM | POA: Insufficient documentation

## 2010-12-19 LAB — WET PREP, GENITAL
Clue Cells Wet Prep HPF POC: NONE SEEN
Yeast Wet Prep HPF POC: NONE SEEN

## 2010-12-19 LAB — URINALYSIS, ROUTINE W REFLEX MICROSCOPIC
Ketones, ur: NEGATIVE mg/dL
Leukocytes, UA: NEGATIVE
Protein, ur: NEGATIVE mg/dL
Urobilinogen, UA: 0.2 mg/dL (ref 0.0–1.0)

## 2010-12-19 NOTE — Progress Notes (Signed)
Patient presents for appointment but says she wants to be referred to [facility].  Will defer [facility] and OB histroy.  She says her abdominal pain has resolved. No vaginal bleeding or discharge.  She quit smoking after she found out she was pregnant.  She is not taking a prenatatal vitamin because they make her nauseated.  She says when she does not take them she feels ok.    Exam:  Obese female in no distress Fundal height below umbilicus Fetal heart tones 145  A/P 20 yo G1 @ [redacted]w[redacted]d by sure LMP: Patient with cramping and abdominal pain early in her pregnancy- fetal heart tones heard on exam today and BHCG normal for stage of pregnancy drawn ASCUS pap in 10/2010- plan to do colpo after delivery Obesity- needs early Glucola Hx of Genital herpes- continue Acyclovir PRN, plan to take last 4 weeks of pregnancy Tobacco use- patient has quit smoking Advised prenatal vitamin or children's vitamins if nausea too much Will order referral to [facility].

## 2010-12-19 NOTE — Patient Instructions (Signed)
It was nice to meet you.  We will contact [facility] to make sure they have an order for your referral.  You are 13 weeks and 4 days along in your pregnancy.  I recommend taking a prenatal vitamin every day, but if they make you feel sick, you can take two chewable children's vitamins instead.  Please be sure to keep your appointment at [facility].

## 2010-12-19 NOTE — ED Provider Notes (Signed)
Chief Complaint  Patient presents with  . Vaginal Bleeding   Andrea Morton is a G1P0 at [redacted]w[redacted]d who had a NOB at [facility] this am without pelvic exam, then at 1400 today had 1st episode of pink vaginal spotting and mild cramping, both of which she denies now. She had a normal placenta/no SCH  on 7.4 wk [place]. Denies irritative vaginitis or dysuria, hematuria. Known ASCUS on last Pap and has been treated for trich early in pregnancy. Denies sexual activity since hshe had a neg GC/CT on 10/27/10.  No known hx CHTN.  Has appt at [facility] 12/25/10 (transferring care).  A pos. Filed Vitals:   12/19/10 2008  BP: 144/69  Pulse: 85  Temp: 98.4 F (36.9 C)  Resp: 16   Gen: Obese, NAD ABD: NT Pelvic: NEFG, BUS neg             Spec: scant white physiologic discharge present. Cx clean. No blood seen.             Cx: L/C/H A: Early preg bleeding hx P: Bleeding precautions reviewed. Will abstain from intercourse and follow up for prenatal care with [facility].   Maitland Muhlbauer

## 2010-12-19 NOTE — Progress Notes (Signed)
patient is here with c/o sudden onset of abdominal cramping and spotting (pinkish) she states that the spotting has stopped but is concerned. She denies recent intercourse. Next OB appt with [facility] on July 23rd.

## 2010-12-19 NOTE — Progress Notes (Signed)
Pt G1 at 13.6wks, having spotting since today at 1330.  Pt also having cramping.  Pt was seen today at [facility].

## 2011-01-16 ENCOUNTER — Ambulatory Visit (INDEPENDENT_AMBULATORY_CARE_PROVIDER_SITE_OTHER): Payer: Medicaid Other | Admitting: Family Medicine

## 2011-01-16 DIAGNOSIS — R21 Rash and other nonspecific skin eruption: Secondary | ICD-10-CM

## 2011-01-16 DIAGNOSIS — B86 Scabies: Secondary | ICD-10-CM

## 2011-01-16 MED ORDER — PERMETHRIN 5 % EX CREA: TOPICAL_CREAM | CUTANEOUS | Status: DC

## 2011-01-16 MED ORDER — HYDROCORTISONE 1 % EX OINT: TOPICAL_OINTMENT | Freq: Two times a day (BID) | CUTANEOUS | Status: DC

## 2011-01-16 NOTE — Progress Notes (Signed)
Addended by: Edd Arbour on: 01/16/2011 10:11 AM   Modules accepted: Orders

## 2011-01-16 NOTE — Progress Notes (Signed)
Addended by: Edd Arbour on: 01/16/2011 10:12 AM   Modules accepted: Orders

## 2011-01-16 NOTE — Progress Notes (Signed)
S: patient here with itchy rash. She has scabies contact with her mother. She has red papules with excoriations on her arms and her right popliteal fossa. No other rash. No tick exposure. No systemic symptoms. Patient requested doppler of her fetus as well, HR was 145. O: AVSS PE: Papular rash on arms and right popliteal fossa. No tracking.  A/P: 1. Scabies permethrin 5% cream - preg class B Cortisone 1% cream - for itching 2. Pregnancy - 145 FHR - will follow up for routine OB visit as scheduled.

## 2011-01-22 ENCOUNTER — Other Ambulatory Visit (HOSPITAL_COMMUNITY): Payer: Self-pay | Admitting: Obstetrics and Gynecology

## 2011-01-22 DIAGNOSIS — Z3689 Encounter for other specified antenatal screening: Secondary | ICD-10-CM

## 2011-01-29 ENCOUNTER — Ambulatory Visit (HOSPITAL_COMMUNITY)
Admission: RE | Admit: 2011-01-29 | Discharge: 2011-01-29 | Disposition: A | Discharge status: Home / Self Care | Payer: Medicaid Other | Source: Ambulatory Visit | Attending: Obstetrics and Gynecology | Admitting: Obstetrics and Gynecology

## 2011-01-29 DIAGNOSIS — A6000 Herpesviral infection of urogenital system, unspecified: Secondary | ICD-10-CM | POA: Insufficient documentation

## 2011-01-29 DIAGNOSIS — O358XX0 Maternal care for other (suspected) fetal abnormality and damage, not applicable or unspecified: Secondary | ICD-10-CM | POA: Insufficient documentation

## 2011-01-29 DIAGNOSIS — Z3689 Encounter for other specified antenatal screening: Secondary | ICD-10-CM

## 2011-01-29 DIAGNOSIS — O98519 Other viral diseases complicating pregnancy, unspecified trimester: Secondary | ICD-10-CM | POA: Insufficient documentation

## 2011-01-29 DIAGNOSIS — E669 Obesity, unspecified: Secondary | ICD-10-CM | POA: Insufficient documentation

## 2011-02-13 ENCOUNTER — Encounter (HOSPITAL_COMMUNITY): Payer: Self-pay | Admitting: *Deleted

## 2011-02-13 ENCOUNTER — Inpatient Hospital Stay (HOSPITAL_COMMUNITY)
Admission: AD | Admit: 2011-02-13 | Discharge: 2011-02-13 | Disposition: A | Discharge status: Home / Self Care | Payer: Medicaid Other | Source: Ambulatory Visit | Attending: Obstetrics and Gynecology | Admitting: Obstetrics and Gynecology

## 2011-02-13 DIAGNOSIS — M549 Dorsalgia, unspecified: Secondary | ICD-10-CM | POA: Insufficient documentation

## 2011-02-13 DIAGNOSIS — B373 Candidiasis of vulva and vagina: Secondary | ICD-10-CM

## 2011-02-13 DIAGNOSIS — O99891 Other specified diseases and conditions complicating pregnancy: Secondary | ICD-10-CM | POA: Insufficient documentation

## 2011-02-13 DIAGNOSIS — R141 Gas pain: Secondary | ICD-10-CM

## 2011-02-13 DIAGNOSIS — R109 Unspecified abdominal pain: Secondary | ICD-10-CM | POA: Insufficient documentation

## 2011-02-13 LAB — URINALYSIS, ROUTINE W REFLEX MICROSCOPIC
Bilirubin Urine: NEGATIVE
Glucose, UA: NEGATIVE mg/dL
Ketones, ur: NEGATIVE mg/dL
Leukocytes, UA: NEGATIVE
Nitrite: NEGATIVE
Protein, ur: NEGATIVE mg/dL
pH: 6 (ref 5.0–8.0)

## 2011-02-13 MED ORDER — FAMOTIDINE 20 MG PO TABS
40.0000 mg | ORAL_TABLET | Freq: Once | ORAL | Status: AC
  Administered 2011-02-13: 40 mg via ORAL
  Filled 2011-02-13: qty 1

## 2011-02-13 MED ORDER — SIMETHICONE 80 MG PO CHEW
160.0000 mg | CHEWABLE_TABLET | Freq: Once | ORAL | Status: AC
  Administered 2011-02-13: 160 mg via ORAL
  Filled 2011-02-13: qty 2

## 2011-02-13 MED ORDER — FAMOTIDINE 20 MG PO TABS
40.0000 mg | ORAL_TABLET | Freq: Once | ORAL | Status: DC
  Filled 2011-02-13: qty 2

## 2011-02-13 MED ORDER — IBUPROFEN 800 MG PO TABS
800.0000 mg | ORAL_TABLET | Freq: Once | ORAL | Status: AC
  Administered 2011-02-13: 800 mg via ORAL
  Filled 2011-02-13: qty 1

## 2011-02-13 NOTE — ED Provider Notes (Signed)
History   Andrea Morton is a 20 y.o. SB morbidly obese female, G1P0 at 21.4 wks per EDC of 06-22-11, who presents unannounced with CC of mid back pain and upper abdominal pain since around [place].  Pregnancy r/f 1.  Morbidly obese w/ elevated HgA1C  2.  HSV hx  3.  Scabies in early preg  4.  GBS pos in urine  5.  1st trimester Trich  6.  2nd trimester spotting  7.  Smoking cessation w/ pos UPT. Pt unemployed and not in school; very sedentary lifestyle & poor dietary choices.    Chief Complaint  Patient presents with  . Back Pain   HPI  OB History    Grav Para Term Preterm Abortions TAB SAB Ect Mult Living   1               Past Medical History  Diagnosis Date  . Herpes, genital   . Herpes infection     last outbreak jan. 2012  . Herpes     Past Surgical History  Procedure Date  . No past surgeries     Family History  Problem Relation Age of Onset  . Diabetes Mother   . Obesity Mother   . Asthma Sister   . Obesity Sister   . Diabetes Maternal Aunt   . Obesity Maternal Aunt   . Diabetes Maternal Grandmother     History  Substance Use Topics  . Smoking status: Former Smoker -- 2 years    Types: Cigarettes    Quit date: 12/13/2010  . Smokeless tobacco: Not on file  . Alcohol Use: No    Allergies: No Known Allergies  Prescriptions prior to admission  Medication Sig Dispense Refill  . acetaminophen (TYLENOL) 500 MG tablet Take 500 mg by mouth every 6 (six) hours as needed. For pain       . Prenatal Vit-Fe Fumarate-FA (PRENATAL MULTIVITAMIN) 60-1 MG tablet Take 1 tablet by mouth daily.  30 tablet  11  . prenatal vitamin w/FE, FA (PRENATAL 1 + 1) 27-1 MG TABS Take 1 tablet by mouth daily.        Marland Kitchen acyclovir (ZOVIRAX) 400 MG tablet Take 1 tablet (400 mg total) by mouth 3 (three) times daily. One tab by mouth 3 times a day x 5 days for recurrence  15 tablet  1  . hydrocortisone 1 % ointment Apply topically 2 (two) times daily.  30 g  0  . permethrin (ELIMITE) 5 % cream  Apply to affected areas. Leave on for 8 hours before rinsing off with soap and water. Reapply to skin in 1 week.  60 g  1    Review of Systems  Constitutional: Negative.   HENT: Negative for neck pain.   Respiratory: Negative.   Cardiovascular: Negative.   Gastrointestinal: Positive for abdominal pain. Negative for nausea, vomiting, diarrhea, constipation and blood in stool.       Upper abdominal pains since around MN--took 2  "Tylenol" around [place]; unsure if indigestion or gas.  Last nml BM yesterday.  Last ate around 2130; reports hungry often and eats about every 3-4 hrs.  Does not drink water; mainly drinks "juice."  No lower abdominal pains.  Genitourinary: Negative.   Musculoskeletal: Positive for back pain. Negative for joint pain and falls.       Back pain awoke her out of sleep around [place] as well; accompanied by upper abd. Pains.  Tried "muscle cream" on mid back, but no relief.  Tylenol as well didn't help.  Pain radiates from mid back to front and in reverse at times.    Skin: Negative.    Physical Exam   Blood pressure 136/80, pulse 94, temperature 98.3 F (36.8 C), resp. rate 20, height 5\' 10"  (1.778 m), weight 156.945 kg (346 lb), last menstrual period 09/15/2010.  Physical Exam  Constitutional: She is oriented to person, place, and time. She appears well-developed and well-nourished. No distress.  Cardiovascular: Normal rate and regular rhythm.   Respiratory: Effort normal and breath sounds normal.  GI: Soft. Bowel sounds are normal. She exhibits no distension and no mass. There is no tenderness. There is no rebound and no guarding.  Musculoskeletal: She exhibits no edema.       Slight c/o of tenderness when percussion of CVA;  No edema, masses, lac's, bruising.  Pt denies any change in recent activities, which could have caused pulled muscle, injury.  Neurological: She is alert and oriented to person, place, and time.       Resting on Rt side on stretcher in MAU; NAD; drowsy;   Skin: She is not diaphoretic.  Pelvic deferred  MAU Course  Procedures 1.  U/a--wnl (sg=1.025 2.  Pos FHT's 3.  Physical exam 4.  Motrin 800mg  po x1 in MAU 5.  Mylicon & Pepcid in MAU x1 as well  Assessment and Plan  1.  IUP at 21.4 2.  Upper abdominal pain, along with mid-back pain 3.  Suspect GI related vs uterine (gas pains/indigestion) 4.  Pain improved after Motrin (pt asked to go home at time of pepcid & mylicon) 5.  Morbidly obese 6.  Poor dietary choices/no water intake  1.  D/c home--instruction on Motrin, Zantac, gas-x prn; comfort measures for back discussed as well 2.  F/u 02/19/11 at [facility] as scheduled or prn worsening s/s 3.  Diet discussed at length again 4.  Enc'd daily exercise/stretching  Jesiah Yerby H 02/13/2011, 3:11 AM

## 2011-02-13 NOTE — Progress Notes (Signed)
Pt reports sharp pains across the middle of her back and in lower abd x 30 mins. Denies nausea,vomiting, diarrhea, fever.

## 2011-02-19 ENCOUNTER — Other Ambulatory Visit (HOSPITAL_COMMUNITY): Payer: Self-pay | Admitting: Obstetrics and Gynecology

## 2011-02-19 DIAGNOSIS — Z3689 Encounter for other specified antenatal screening: Secondary | ICD-10-CM

## 2011-03-05 ENCOUNTER — Ambulatory Visit (HOSPITAL_COMMUNITY)
Admission: RE | Admit: 2011-03-05 | Discharge: 2011-03-05 | Disposition: A | Discharge status: Home / Self Care | Payer: Medicaid Other | Source: Ambulatory Visit | Attending: Obstetrics and Gynecology | Admitting: Obstetrics and Gynecology

## 2011-03-05 DIAGNOSIS — O98519 Other viral diseases complicating pregnancy, unspecified trimester: Secondary | ICD-10-CM | POA: Insufficient documentation

## 2011-03-05 DIAGNOSIS — Z3689 Encounter for other specified antenatal screening: Secondary | ICD-10-CM

## 2011-03-05 DIAGNOSIS — A6000 Herpesviral infection of urogenital system, unspecified: Secondary | ICD-10-CM | POA: Insufficient documentation

## 2011-03-05 DIAGNOSIS — O358XX0 Maternal care for other (suspected) fetal abnormality and damage, not applicable or unspecified: Secondary | ICD-10-CM | POA: Insufficient documentation

## 2011-03-05 DIAGNOSIS — E669 Obesity, unspecified: Secondary | ICD-10-CM | POA: Insufficient documentation

## 2011-03-06 ENCOUNTER — Inpatient Hospital Stay (HOSPITAL_COMMUNITY)
Admission: EM | Admit: 2011-03-06 | Discharge: 2011-03-08 | DRG: 782 | Disposition: A | Discharge status: Home / Self Care | Payer: Medicaid Other | Source: Ambulatory Visit | Attending: Obstetrics and Gynecology | Admitting: Obstetrics and Gynecology

## 2011-03-06 ENCOUNTER — Other Ambulatory Visit: Payer: Self-pay | Admitting: Obstetrics and Gynecology

## 2011-03-06 ENCOUNTER — Encounter (HOSPITAL_COMMUNITY): Payer: Self-pay | Admitting: *Deleted

## 2011-03-06 DIAGNOSIS — R21 Rash and other nonspecific skin eruption: Secondary | ICD-10-CM

## 2011-03-06 DIAGNOSIS — O139 Gestational [pregnancy-induced] hypertension without significant proteinuria, unspecified trimester: Principal | ICD-10-CM | POA: Diagnosis present

## 2011-03-06 DIAGNOSIS — F17200 Nicotine dependence, unspecified, uncomplicated: Secondary | ICD-10-CM

## 2011-03-06 DIAGNOSIS — B373 Candidiasis of vulva and vagina: Secondary | ICD-10-CM

## 2011-03-06 DIAGNOSIS — A6000 Herpesviral infection of urogenital system, unspecified: Secondary | ICD-10-CM

## 2011-03-06 DIAGNOSIS — E669 Obesity, unspecified: Secondary | ICD-10-CM | POA: Diagnosis present

## 2011-03-06 DIAGNOSIS — R030 Elevated blood-pressure reading, without diagnosis of hypertension: Secondary | ICD-10-CM

## 2011-03-06 LAB — COMPREHENSIVE METABOLIC PANEL
Alkaline Phosphatase: 81 U/L (ref 39–117)
BUN: 6 mg/dL (ref 6–23)
CO2: 24 mEq/L (ref 19–32)
Chloride: 101 mEq/L (ref 96–112)
GFR calc Af Amer: >90 mL/min (ref >90)
GFR calc non Af Amer: >90 mL/min (ref >90)
Glucose, Bld: 107 mg/dL — ABNORMAL HIGH (ref 70–99)
Potassium: 3.5 mEq/L (ref 3.5–5.1)
Total Bilirubin: 0.2 mg/dL — ABNORMAL LOW (ref 0.3–1.2)

## 2011-03-06 LAB — CBC
Hemoglobin: 10.7 g/dL — ABNORMAL LOW (ref 12.0–15.0)
MCH: 31.7 pg (ref 26.0–34.0)
MCV: 94.4 fL (ref 78.0–100.0)
RBC: 3.38 MIL/uL — ABNORMAL LOW (ref 3.87–5.11)

## 2011-03-06 LAB — URINALYSIS, DIPSTICK ONLY
Bilirubin Urine: NEGATIVE
Hgb urine dipstick: NEGATIVE
Ketones, ur: NEGATIVE mg/dL
Protein, ur: NEGATIVE mg/dL
Specific Gravity, Urine: 1.02 (ref 1.005–1.030)
Urobilinogen, UA: 1 mg/dL (ref 0.0–1.0)

## 2011-03-06 MED ORDER — PRENATAL PLUS 27-1 MG PO TABS
1.0000 | ORAL_TABLET | Freq: Every day | ORAL | Status: DC
  Administered 2011-03-07 – 2011-03-08 (×2): 1 via ORAL
  Filled 2011-03-06 (×2): qty 1

## 2011-03-06 MED ORDER — DOCUSATE SODIUM 100 MG PO CAPS
100.0000 mg | ORAL_CAPSULE | Freq: Every day | ORAL | Status: DC
  Filled 2011-03-06: qty 1

## 2011-03-06 MED ORDER — CALCIUM CARBONATE ANTACID 500 MG PO CHEW: 2.0000 | CHEWABLE_TABLET | ORAL | Status: DC | PRN

## 2011-03-06 MED ORDER — ACETAMINOPHEN 325 MG PO TABS
650.0000 mg | ORAL_TABLET | ORAL | Status: DC | PRN
  Administered 2011-03-07: 650 mg via ORAL
  Filled 2011-03-06: qty 2

## 2011-03-06 MED ORDER — ZOLPIDEM TARTRATE 10 MG PO TABS
10.0000 mg | ORAL_TABLET | Freq: Every evening | ORAL | Status: DC | PRN
  Administered 2011-03-07: 10 mg via ORAL
  Filled 2011-03-06: qty 1

## 2011-03-06 NOTE — Progress Notes (Signed)
 NST completed

## 2011-03-06 NOTE — H&P (Signed)
Andrea Morton is a 20 y.o.morbidly obese single black female presenting at 24.4 weeks for Blood pressure observation, PIH w/u with 24 hr urine collection.  Had routine visit yesterday with normal growth u/s per pt (EFW=1+14 lbs).  Called office today w/ persistent HA, and had work-in appt, and BP=140/66, 128/90.  Denies other PIH s/s except the HA.  Her mother and sister are at bs.  Pt is unemployed and not in school.  Denies abdominal pain, cramping, tightening, VB, abnl d/c, LOF.   Maternal Medical History:  Fetal activity: Perceived fetal activity is normal and recently changed.   Last perceived fetal movement was within the past hour.   Reported decreased FM when taking motrin & tylenol for HA over last few days--last time taken was earlier this morning, and this PM, FM has been normal  Prenatal complications: 1.  Morbidly obese w/ elevated HgA1c 2.  HSV hx 3.  Scabies in early pregnancy 4.  GBS positive in urine 5.  1st trimester Trich 6.  2nd trimester spotting 7.  Smoking cessation w/ positive UPT 8.  Poor dietary choices w/ sedentary lifestyle     OB History    Grav Para Term Preterm Abortions TAB SAB Ect Mult Living   1 0 0 0 0 0 0 0 0 0      Past Medical History  Diagnosis Date  . Herpes, genital   . Herpes infection     last outbreak jan. 2012  . Herpes    Past Surgical History  Procedure Date  . No past surgeries    Family History: family history includes Asthma in her sister; Diabetes in her maternal aunt, maternal grandmother, and mother; and Obesity in her maternal aunt, mother, and sister. Social History:  reports that she quit smoking about 2 months ago. Her smoking use included Cigarettes. She quit after 2 years of use. She has quit using smokeless tobacco. She reports that she does not drink alcohol or use illicit drugs.  Review of Systems  Constitutional: Negative.   HENT:       Frontal HA for last 3 days; current pain 6/10; declines pain medicine at  present; HA doesn't radiate.    Eyes: Negative.   Respiratory: Negative.        Reports just getting over a "cold"  Cardiovascular: Negative.   Gastrointestinal: Negative.   Genitourinary: Negative.   Neurological: Positive for headaches.   VS:  143/72, 21, 106, afebrile   Height 5\' 11"  (1.803 m), weight 154.223 kg (340 lb), last menstrual period 09/15/2010. Maternal Exam:  Abdomen: Patient reports no abdominal tenderness. Introitus: not evaluated.   Cervix: not evaluated.   Fetal Exam Fetal Monitor Review: Mode: ultrasound.   Baseline rate: 145.  Variability: moderate (6-25 bpm).   Pattern: accelerations present and no decelerations.    Fetal State Assessment: Category I - tracings are normal.    TOCO:  NO UC OR UI  Physical Exam  Constitutional: She is oriented to person, place, and time. She appears well-developed and well-nourished. No distress.  HENT:       Several facial piercings  Cardiovascular: Normal rate and regular rhythm.   Respiratory: Effort normal and breath sounds normal.  GI: Soft. Bowel sounds are normal.  Musculoskeletal: She exhibits no edema.  Neurological: She is alert and oriented to person, place, and time. She has normal reflexes.  Skin: Skin is warm and dry. She is not diaphoretic.    Prenatal labs: ABO, Rh: A/POS/-- (06/19 1425)  Antibody: NEG (06/19 1425) Rubella: 12.3 (06/19 1425) RPR: NON REAC (06/19 1425)  HBsAg: NEGATIVE (06/19 1425)  HIV: NON REACTIVE (06/19 1425)  GBS:   positive  Assessment/Plan: 1.  IUP 24.4 2.  Persistent Headache last 3 days 3.  Elevated BP at office today 4.  Morbidly obese 5.  Cat I FHT  1.  Admit to [facility] with Dr. Su Hilt as attending 2.  PIH labs and start 24 hr urine 3.  BP monitoring 4.  MD to follow  Antonietta Breach 03/06/2011, 9:19 PM

## 2011-03-07 MED ORDER — ZOLPIDEM TARTRATE 10 MG PO TABS: 10.0000 mg | ORAL_TABLET | Freq: Every evening | ORAL | Status: DC | PRN

## 2011-03-07 NOTE — Progress Notes (Signed)
Hospital day # 1 pregnancy at [redacted]w[redacted]d  S: well, reports good fetal activity. Still c/o mild headache 3/10. Denies visual symptoms or epigastric pain.      Contractions:none      Vaginal bleeding:none now       Vaginal discharge: no significant change  O: BP 146/73  Pulse 85  Temp(Src) 98.4 F (36.9 C) (Oral)  Resp 20  Ht 5\' 11"  (1.803 m)  Wt 154.223 kg (340 lb)  BMI 47.42 kg/m2  LMP 09/15/2010      Fetal tracings:reviewed and reassuring for 24+ weeks      Uterus gravid and non-tender      Extremities: edema 1+ and no signs of DVT  A: [redacted]w[redacted]d with headache and variable BP     Stable     Here to r/o pre-eclampsia: 24 hour urine collection up at 21:35 tonight  P: continue current plan of care      Monitor BP to determine need to add antihypertensive  Tamalyn Wadsworth A  MD 03/07/2011 10:47 AM

## 2011-03-08 LAB — PROTEIN, URINE, 24 HOUR: Urine Total Volume-UPROT: 2100 mL

## 2011-03-08 LAB — CREATININE CLEARANCE, URINE, 24 HOUR
Collection Interval-CRCL: 24 hours
Creatinine Clearance: 290 mL/min — ABNORMAL HIGH (ref 75–115)
Creatinine, 24H Ur: 2424 mg/d — ABNORMAL HIGH (ref 700–1800)

## 2011-03-08 MED ORDER — LABETALOL HCL 100 MG PO TABS: 100.0000 mg | ORAL_TABLET | Freq: Two times a day (BID) | ORAL | Status: DC

## 2011-03-08 MED ORDER — DSS 100 MG PO CAPS: 100.0000 mg | ORAL_CAPSULE | Freq: Every day | ORAL | Status: AC

## 2011-03-08 NOTE — Progress Notes (Addendum)
Obstetric Discharge Summary Reason for Admission: observation/evaluation Management of Hypertension; Rule out preeclampsia Prenatal Procedures: none Intrapartum Procedures: Observation. Management of hypertension. Postpartum Procedures: NA Complications-Operative and Postpartum: none Hemoglobin  Date Value Range Status  03/06/2011 10.7* 12.0-15.0 (g/dL) Final     HCT  Date Value Range Status  03/06/2011 31.9* 36.0-46.0 (%) Final   The patient was admitted on 03/06/11. Her 24 hour urine protein was 126 mg/24 hours.  Her blood pressure varied. The FHT remained stable.  Her labs were normal. The patient was discharged undelivered on March 08, 2011.  Discharge Diagnoses: 24 5/[redacted] weeks gestation.                                         Hypertension        Obesity  Discharge Information: Date: 03/08/2011 Activity: unrestricted Diet: routine Medications: PNV, Colace and Labetalol 100 mg Bid Condition: stable and improved Instructions: refer to practice specific booklet and Htn during Pregnancy Discharge to: home Follow-up Information    Follow up with [facility] in 4 days.         Newborn Data: This patient has no babies on file. Home with NA.  Norinne Jeane V 03/08/2011, 2:23 PM

## 2011-03-08 NOTE — Plan of Care (Signed)
Problem: Consults Goal: Birthing Suites Patient Information Press F2 to bring up selections list  Outcome: Not Applicable Date Met:  03/08/11  Pt was an antenatal patient

## 2011-03-08 NOTE — Progress Notes (Addendum)
20 y.o. year old female,at [redacted]w[redacted]d gestation.  SUBJECTIVE:  No headaches, blurred vision, or RUQ tenderness.  OBJECTIVE:  BP 146/64  Pulse 103  Temp(Src) 98.6 F (37 C) (Oral)  Resp 20  Ht 5\' 11"  (1.803 m)  Wt 154.223 kg (340 lb)  BMI 47.42 kg/m2  LMP 09/15/2010  Fetal Heart Tones:  stable  Contractions:          none  Chest: Clear  Heart: RRR  Abd: gravid; Nontender  Ext: WNL  Relexes: WNL  24 Hour Urine Protein:  126 mg/24 hours  ASSESSMENT:  [redacted]w[redacted]d Weeks Pregnancy  Hypertension  Obesity  PLAN:  Will discharge the patient to home with Labetalol 100 BID.  RTO in four days. Call for problems.  Leonard Schwartz, M.D.

## 2011-03-13 ENCOUNTER — Encounter (HOSPITAL_COMMUNITY): Payer: Self-pay | Admitting: *Deleted

## 2011-03-13 ENCOUNTER — Inpatient Hospital Stay (HOSPITAL_COMMUNITY)
Admission: AD | Admit: 2011-03-13 | Discharge: 2011-03-13 | Disposition: A | Discharge status: Home / Self Care | Payer: Medicaid Other | Source: Ambulatory Visit | Attending: Obstetrics and Gynecology | Admitting: Obstetrics and Gynecology

## 2011-03-13 DIAGNOSIS — B373 Candidiasis of vulva and vagina: Secondary | ICD-10-CM

## 2011-03-13 DIAGNOSIS — R1031 Right lower quadrant pain: Secondary | ICD-10-CM | POA: Insufficient documentation

## 2011-03-13 DIAGNOSIS — O10019 Pre-existing essential hypertension complicating pregnancy, unspecified trimester: Secondary | ICD-10-CM | POA: Insufficient documentation

## 2011-03-13 LAB — URINALYSIS, ROUTINE W REFLEX MICROSCOPIC
Bilirubin Urine: NEGATIVE
Specific Gravity, Urine: >1.03 — ABNORMAL HIGH (ref 1.005–1.030)
pH: 6 (ref 5.0–8.0)

## 2011-03-13 LAB — COMPREHENSIVE METABOLIC PANEL
AST: 16 U/L (ref 0–37)
Albumin: 2.7 g/dL — ABNORMAL LOW (ref 3.5–5.2)
Alkaline Phosphatase: 90 U/L (ref 39–117)
BUN: 8 mg/dL (ref 6–23)
CO2: 23 mEq/L (ref 19–32)
Chloride: 103 mEq/L (ref 96–112)
GFR calc non Af Amer: >90 mL/min (ref >90)
Potassium: 3.6 mEq/L (ref 3.5–5.1)
Total Bilirubin: 0.1 mg/dL — ABNORMAL LOW (ref 0.3–1.2)

## 2011-03-13 LAB — URINE MICROSCOPIC-ADD ON

## 2011-03-13 LAB — CBC
HCT: 33.8 % — ABNORMAL LOW (ref 36.0–46.0)
RBC: 3.62 MIL/uL — ABNORMAL LOW (ref 3.87–5.11)
RDW: 13.5 % (ref 11.5–15.5)
WBC: 11.2 10*3/uL — ABNORMAL HIGH (ref 4.0–10.5)

## 2011-03-13 LAB — URIC ACID: Uric Acid, Serum: 4.3 mg/dL (ref 2.4–7.0)

## 2011-03-13 MED ORDER — IBUPROFEN 600 MG PO TABS
600.0000 mg | ORAL_TABLET | Freq: Once | ORAL | Status: AC
  Administered 2011-03-13: 600 mg via ORAL
  Filled 2011-03-13: qty 1

## 2011-03-13 MED ORDER — PROMETHAZINE HCL 50 MG PO TABS: 25.0000 mg | ORAL_TABLET | Freq: Four times a day (QID) | ORAL | Status: DC | PRN

## 2011-03-13 MED ORDER — PROMETHAZINE HCL 25 MG PO TABS
25.0000 mg | ORAL_TABLET | Freq: Once | ORAL | Status: AC
  Administered 2011-03-13: 25 mg via ORAL
  Filled 2011-03-13: qty 1

## 2011-03-13 NOTE — ED Provider Notes (Signed)
History     Chief Complaint  Patient presents with  . Abdominal Pain   HPI Comments: Pt is a 20yo G1P000 at [redacted]w[redacted]d, arrived announced c/o r sided pain, denies ctx or cramping, denies LOF or VB, reports +FM. Has tried tylenol without relief. Pt was admitted for obs on 10-3 to r/o pre-eclampsia, 24hr total protein 124. Pt also c/o N/V x1 epissode this pm. States pain started around 2300.   Abdominal Pain This is a new problem. The current episode started today. The onset quality is gradual. The problem occurs intermittently. The problem has been unchanged. The pain is located in the RLQ. The abdominal pain does not radiate. Associated symptoms include nausea and vomiting. Pertinent negatives include no dysuria or headaches. She has tried acetaminophen for the symptoms.   Pregnancy is s/f: 1. Morbidly obese, sedentary 2. HSV HX 3. Hx scabies during pregnancy 4. +GBS 5. 1st trimester trich 6. 2nd trimester spotting 7. Hx smoking   Past Medical History  Diagnosis Date  . Herpes, genital   . Herpes infection     last outbreak jan. 2012  . Herpes     Past Surgical History  Procedure Date  . No past surgeries     Family History  Problem Relation Age of Onset  . Diabetes Mother   . Obesity Mother   . Asthma Sister   . Obesity Sister   . Diabetes Maternal Aunt   . Obesity Maternal Aunt   . Diabetes Maternal Grandmother     History  Substance Use Topics  . Smoking status: Former Smoker -- 2 years    Types: Cigarettes    Quit date: 12/13/2010  . Smokeless tobacco: Former Neurosurgeon  . Alcohol Use: No    Allergies: No Known Allergies  Prescriptions prior to admission  Medication Sig Dispense Refill  . acetaminophen (TYLENOL) 500 MG tablet Take 500 mg by mouth every 6 (six) hours as needed. For pain       . labetalol (NORMODYNE) 100 MG tablet Take 1 tablet (100 mg total) by mouth 2 (two) times daily.  60 tablet  3  . prenatal vitamin w/FE, FA (PRENATAL 1 + 1) 27-1 MG TABS Take  1 tablet by mouth daily.        Marland Kitchen docusate sodium 100 MG CAPS Take 100 mg by mouth daily.  60 capsule  4    Review of Systems  Cardiovascular: Negative for chest pain.  Gastrointestinal: Positive for nausea, vomiting and abdominal pain.  Genitourinary: Negative for dysuria.  Neurological: Negative for headaches.  All other systems reviewed and are negative.   Physical Exam   Blood pressure 142/77, pulse 89, temperature 98.6 F (37 C), resp. rate 20, height 5\' 10"  (1.778 m), weight 162.841 kg (359 lb), last menstrual period 09/15/2010.  Filed Vitals:   03/13/11 0205 03/13/11 0221 03/13/11 0303  BP: 157/101 142/77 129/73  Pulse: 94 89 76  Temp: 98.6 F (37 C)    Resp: 20    Height: 5\' 10"  (1.778 m)    Weight: 162.841 kg (359 lb)           Physical Exam  Constitutional: She is oriented to person, place, and time. She appears well-developed and well-nourished. No distress.  Cardiovascular: Normal rate.   Respiratory: Effort normal.  GI: Soft. Bowel sounds are normal. She exhibits no distension. There is no tenderness. There is no rebound and no guarding.  Musculoskeletal: Normal range of motion.  Neurological: She is alert and oriented  to person, place, and time. She has normal reflexes.  Skin: Skin is warm and dry.  Psychiatric:       Pt has flat affect and appears angry and distracted   FHR 150 reactive reassuring for 26wks, audibile fetal movement toco quiet Pelvic deferred    Results for orders placed during the hospital encounter of 03/13/11 (from the past 24 hour(s))  URINALYSIS, ROUTINE W REFLEX MICROSCOPIC     Status: Abnormal   Collection Time   03/13/11  2:00 AM      Component Value Range   Color, Urine YELLOW  YELLOW    Appearance CLEAR  CLEAR    Specific Gravity, Urine >1.030 (*) 1.005 - 1.030    pH 6.0  5.0 - 8.0    Glucose, UA NEGATIVE  NEGATIVE (mg/dL)   Hgb urine dipstick TRACE (*) NEGATIVE    Bilirubin Urine NEGATIVE  NEGATIVE    Ketones, ur  NEGATIVE  NEGATIVE (mg/dL)   Protein, ur NEGATIVE  NEGATIVE (mg/dL)   Urobilinogen, UA 0.2  0.0 - 1.0 (mg/dL)   Nitrite NEGATIVE  NEGATIVE    Leukocytes, UA NEGATIVE  NEGATIVE   URINE MICROSCOPIC-ADD ON     Status: Abnormal   Collection Time   03/13/11  2:00 AM      Component Value Range   Squamous Epithelial / LPF FEW (*) RARE    WBC, UA 3-6  <3 (WBC/hpf)   RBC / HPF 0-2  <3 (RBC/hpf)   Bacteria, UA FEW (*) RARE    Urine-Other MUCOUS PRESENT    CBC     Status: Abnormal   Collection Time   03/13/11  2:25 AM      Component Value Range   WBC 11.2 (*) 4.0 - 10.5 (K/uL)   RBC 3.62 (*) 3.87 - 5.11 (MIL/uL)   Hemoglobin 11.4 (*) 12.0 - 15.0 (g/dL)   HCT 16.1 (*) 09.6 - 46.0 (%)   MCV 93.4  78.0 - 100.0 (fL)   MCH 31.5  26.0 - 34.0 (pg)   MCHC 33.7  30.0 - 36.0 (g/dL)   RDW 04.5  40.9 - 81.1 (%)   Platelets 273  150 - 400 (K/uL)  COMPREHENSIVE METABOLIC PANEL     Status: Abnormal   Collection Time   03/13/11  2:25 AM      Component Value Range   Sodium 135  135 - 145 (mEq/L)   Potassium 3.6  3.5 - 5.1 (mEq/L)   Chloride 103  96 - 112 (mEq/L)   CO2 23  19 - 32 (mEq/L)   Glucose, Bld 106 (*) 70 - 99 (mg/dL)   BUN 8  6 - 23 (mg/dL)   Creatinine, Ser 9.14  0.50 - 1.10 (mg/dL)   Calcium 9.7  8.4 - 78.2 (mg/dL)   Total Protein 6.6  6.0 - 8.3 (g/dL)   Albumin 2.7 (*) 3.5 - 5.2 (g/dL)   AST 16  0 - 37 (U/L)   ALT 12  0 - 35 (U/L)   Alkaline Phosphatase 90  39 - 117 (U/L)   Total Bilirubin 0.1 (*) 0.3 - 1.2 (mg/dL)   GFR calc non Af Amer >90  >90 (mL/min)   GFR calc Af Amer >90  >90 (mL/min)  URIC ACID     Status: Normal   Collection Time   03/13/11  2:25 AM      Component Value Range   Uric Acid, Serum 4.3  2.4 - 7.0 (mg/dL)  LACTATE DEHYDROGENASE  Status: Normal   Collection Time   03/13/11  2:25 AM      Component Value Range   LD 135  94 - 250 (U/L)    MAU Course  Procedures    Assessment and Plan  G1P0 at 25.4 wks CHTN on labetalol No S/S pre-eclampsia today FHR  reassuring  Will try motrin and phenergan Pt has f/u appt tomorrow   D/W Dr. Normand Sloop Will check cervix  Cervix closed, pt feels less nauseous, some relief of pain  Tailynn Armetta M 03/13/2011, 3:06 AM   Will D/C home with rx for phenergan,

## 2011-03-13 NOTE — Progress Notes (Signed)
Rt side of stomach hurting, took ibuprofen without relief, vomited x 2 . Denies bleeding or ROM

## 2011-04-09 ENCOUNTER — Encounter (HOSPITAL_COMMUNITY): Payer: Self-pay

## 2011-04-09 ENCOUNTER — Inpatient Hospital Stay (HOSPITAL_COMMUNITY)
Admission: AD | Admit: 2011-04-09 | Discharge: 2011-04-09 | Disposition: A | Discharge status: Home / Self Care | Payer: Medicaid Other | Source: Ambulatory Visit | Attending: Obstetrics and Gynecology | Admitting: Obstetrics and Gynecology

## 2011-04-09 ENCOUNTER — Other Ambulatory Visit (HOSPITAL_COMMUNITY): Payer: Self-pay | Admitting: Obstetrics and Gynecology

## 2011-04-09 ENCOUNTER — Other Ambulatory Visit: Payer: Self-pay | Admitting: Obstetrics and Gynecology

## 2011-04-09 DIAGNOSIS — I10 Essential (primary) hypertension: Secondary | ICD-10-CM

## 2011-04-09 DIAGNOSIS — B373 Candidiasis of vulva and vagina: Secondary | ICD-10-CM

## 2011-04-09 DIAGNOSIS — O10019 Pre-existing essential hypertension complicating pregnancy, unspecified trimester: Secondary | ICD-10-CM | POA: Insufficient documentation

## 2011-04-09 DIAGNOSIS — O99210 Obesity complicating pregnancy, unspecified trimester: Secondary | ICD-10-CM | POA: Insufficient documentation

## 2011-04-09 DIAGNOSIS — E669 Obesity, unspecified: Secondary | ICD-10-CM | POA: Insufficient documentation

## 2011-04-09 DIAGNOSIS — Z3689 Encounter for other specified antenatal screening: Secondary | ICD-10-CM

## 2011-04-09 DIAGNOSIS — O2690 Pregnancy related conditions, unspecified, unspecified trimester: Secondary | ICD-10-CM

## 2011-04-09 HISTORY — DX: Gestational (pregnancy-induced) hypertension without significant proteinuria, unspecified trimester: O13.9

## 2011-04-09 HISTORY — DX: Essential (primary) hypertension: I10

## 2011-04-09 LAB — COMPREHENSIVE METABOLIC PANEL
AST: 14 U/L (ref 0–37)
Albumin: 2.9 g/dL — ABNORMAL LOW (ref 3.5–5.2)
Alkaline Phosphatase: 98 U/L (ref 39–117)
Chloride: 103 mEq/L (ref 96–112)
Creatinine, Ser: 0.61 mg/dL (ref 0.50–1.10)
Potassium: 3.8 mEq/L (ref 3.5–5.1)
Sodium: 135 mEq/L (ref 135–145)
Total Bilirubin: 0.2 mg/dL — ABNORMAL LOW (ref 0.3–1.2)

## 2011-04-09 LAB — CBC
Platelets: 303 10*3/uL (ref 150–400)
RDW: 13.8 % (ref 11.5–15.5)
WBC: 10.2 10*3/uL (ref 4.0–10.5)

## 2011-04-09 LAB — URIC ACID: Uric Acid, Serum: 4.2 mg/dL (ref 2.4–7.0)

## 2011-04-09 MED ORDER — LABETALOL HCL 100 MG PO TABS: 200.0000 mg | ORAL_TABLET | Freq: Two times a day (BID) | ORAL | Status: DC

## 2011-04-09 NOTE — ED Provider Notes (Signed)
History   Andrea Morton is a 20y.o. Morbidly obese single black female at 29.3 weeks who present from office for [facility] workup.  Seen by FNP at office today and BP elevated despite being on Labetalol 100mg  po bid.  Pt also c/o frontal HA, 5/10, but hasn't tried any relief measures; does report when she does get headaches, her vision do become more blurry and difficulty focusing.  Denies any other PIH s/s.  Reports GFM.  Denies VB, LOF, cramping or ctxs, GI or resp c/o's. Pt completed a 24 hr urine 1st of October during a 23 hr observation and total protein =126.  Followed by MD service at [facility].  Pregnancy r/f:  1.  Morbidly obese w/ elevated HgA1c  2.  HSV hx--on acyclovir prophylaxis 3.  Scabies in early pregnancy  4.  GBS positive in urine  5.  1st trimester Trich  6.  2nd trimester spotting  7.  Smoking cessation w/ positive UPT  8.  Poor dietary choices w/ sedentary lifestyle  9.  CHTN suspected vs. GHTN U/a at office negative for protein, so not repeated in MAU tonight.   Chief Complaint  Patient presents with  . Hypertension   HPI  OB History    Grav Para Term Preterm Abortions TAB SAB Ect Mult Living   1 0 0 0 0 0 0 0 0 0       Past Medical History  Diagnosis Date  . Herpes, genital   . Hypertension   . Pregnancy induced hypertension     Past Surgical History  Procedure Date  . No past surgeries     Family History  Problem Relation Age of Onset  . Diabetes Mother   . Obesity Mother   . Asthma Sister   . Obesity Sister   . Diabetes Maternal Aunt   . Obesity Maternal Aunt   . Diabetes Maternal Grandmother     History  Substance Use Topics  . Smoking status: Former Smoker -- 2 years    Types: Cigarettes    Quit date: 12/13/2010  . Smokeless tobacco: Never Used  . Alcohol Use: No    Allergies: No Known Allergies  Prescriptions prior to admission  Medication Sig Dispense Refill  . prenatal vitamin w/FE, FA (PRENATAL 1 + 1) 27-1 MG TABS Take 1 tablet by mouth  daily.        . valACYclovir (VALTREX) 500 MG tablet Take 500 mg by mouth 2 (two) times daily.        Marland Kitchen DISCONTD: acetaminophen (TYLENOL) 500 MG tablet Take 500 mg by mouth every 6 (six) hours as needed. For pain       . DISCONTD: labetalol (NORMODYNE) 100 MG tablet Take 1 tablet (100 mg total) by mouth 2 (two) times daily.  60 tablet  3    ROS--see HPI Physical Exam   EFM: 150, moderate variability, no decels, reactive TOCO:  Intermittent UI  Filed Vitals:   04/09/11 1940 04/09/11 1955 04/09/11 2010 04/09/11 2037  BP: 137/70 144/88 138/66   Pulse: 96 95 92   Temp:      TempSrc:      Resp:    20  Height:      Weight:       Blood pressure 138/66, pulse 92, temperature 98.6 F (37 C), temperature source Oral, resp. rate 16, height 5\' 9"  (1.753 m), weight 163.295 kg (360 lb), last menstrual period 09/15/2010. LABS: .Marland Kitchen Results for orders placed during the hospital encounter of 04/09/11 (from  the past 72 hour(s))  CBC     Status: Abnormal   Collection Time   04/09/11  7:20 PM      Component Value Range Comment   WBC 10.2  4.0 - 10.5 (K/uL)    RBC 3.84 (*) 3.87 - 5.11 (MIL/uL)    Hemoglobin 11.9 (*) 12.0 - 15.0 (g/dL)    HCT 16.1 (*) 09.6 - 46.0 (%)    MCV 92.4  78.0 - 100.0 (fL)    MCH 31.0  26.0 - 34.0 (pg)    MCHC 33.5  30.0 - 36.0 (g/dL)    RDW 04.5  40.9 - 81.1 (%)    Platelets 303  150 - 400 (K/uL)   COMPREHENSIVE METABOLIC PANEL     Status: Abnormal   Collection Time   04/09/11  7:20 PM      Component Value Range Comment   Sodium 135  135 - 145 (mEq/L)    Potassium 3.8  3.5 - 5.1 (mEq/L)    Chloride 103  96 - 112 (mEq/L)    CO2 20  19 - 32 (mEq/L)    Glucose, Bld 112 (*) 70 - 99 (mg/dL)    BUN 7  6 - 23 (mg/dL)    Creatinine, Ser 9.14  0.50 - 1.10 (mg/dL)    Calcium 78.2  8.4 - 10.5 (mg/dL)    Total Protein 7.2  6.0 - 8.3 (g/dL)    Albumin 2.9 (*) 3.5 - 5.2 (g/dL)    AST 14  0 - 37 (U/L)    ALT 15  0 - 35 (U/L)    Alkaline Phosphatase 98  39 - 117 (U/L)     Total Bilirubin 0.2 (*) 0.3 - 1.2 (mg/dL)    GFR calc non Af Amer >90  >90 (mL/min)    GFR calc Af Amer >90  >90 (mL/min)   LACTATE DEHYDROGENASE     Status: Normal   Collection Time   04/09/11  7:20 PM      Component Value Range Comment   LD 128  94 - 250 (U/L)   URIC ACID     Status: Normal   Collection Time   04/09/11  7:20 PM      Component Value Range Comment   Uric Acid, Serum 4.2  2.4 - 7.0 (mg/dL)    .Physical Exam Gen:  NAD, A&Ox3, obese Abd:  Soft, NT, gravid Pelvic: deferred Ext:  Trace generalized BLE edema; no clonus, DTR's 1+  Pt's weight today=360lbs  MAU Course  Procedures 1.  PIH labs 2.  NST 3.  Serial BP's  Assessment and Plan  1.  IUP at 29.3 weeks 2.  CHTN--no elevated diastolics in MAU 3.  Nml PIH labs 4.  Reactive NST 5.  Morbidly obese  1.  Per c/w Dr. Estanislado Pandy, will d/c home to increase her Labetalol to 200mg  po bid, starting this new dose tonight; enc'd pt to try to take medicine q 12 hrs regularly 2.  Voicemail left at office to call pt to schedule f/u appt at [facility] in 48-72 hrs per Dr. Estanislado Pandy 3.  PIH and PTL precautions 4.  Pt will complete a 24 hr urine the day prior to her appt, and turn it in at [facility] at the time of her appt later this week. 5.  Tylenol #3 called into [facility] off [address], 3 RF for HA/pain 6.  F/u prn.  Breniyah Romm H 04/09/2011, 8:34 PM

## 2011-04-09 NOTE — Progress Notes (Signed)
Pt states has very dull h/a, denies dizziness or blurred vision, is harder at times to focus on things. Denies bleeding or lof. +FM.

## 2011-04-16 ENCOUNTER — Ambulatory Visit (HOSPITAL_COMMUNITY)
Admission: RE | Admit: 2011-04-16 | Discharge: 2011-04-16 | Disposition: A | Discharge status: Home / Self Care | Payer: Medicaid Other | Source: Ambulatory Visit | Attending: Obstetrics and Gynecology | Admitting: Obstetrics and Gynecology

## 2011-04-16 DIAGNOSIS — A6000 Herpesviral infection of urogenital system, unspecified: Secondary | ICD-10-CM | POA: Insufficient documentation

## 2011-04-16 DIAGNOSIS — E669 Obesity, unspecified: Secondary | ICD-10-CM

## 2011-04-16 DIAGNOSIS — O2690 Pregnancy related conditions, unspecified, unspecified trimester: Secondary | ICD-10-CM

## 2011-04-16 DIAGNOSIS — O139 Gestational [pregnancy-induced] hypertension without significant proteinuria, unspecified trimester: Secondary | ICD-10-CM | POA: Insufficient documentation

## 2011-04-16 DIAGNOSIS — O99330 Smoking (tobacco) complicating pregnancy, unspecified trimester: Secondary | ICD-10-CM | POA: Insufficient documentation

## 2011-04-16 DIAGNOSIS — R030 Elevated blood-pressure reading, without diagnosis of hypertension: Secondary | ICD-10-CM

## 2011-04-16 DIAGNOSIS — O98519 Other viral diseases complicating pregnancy, unspecified trimester: Secondary | ICD-10-CM | POA: Insufficient documentation

## 2011-04-23 ENCOUNTER — Inpatient Hospital Stay (HOSPITAL_COMMUNITY)
Admission: AD | Admit: 2011-04-23 | Discharge: 2011-04-23 | Disposition: A | Discharge status: Home / Self Care | Payer: Medicaid Other | Source: Ambulatory Visit | Attending: Obstetrics and Gynecology | Admitting: Obstetrics and Gynecology

## 2011-04-23 ENCOUNTER — Inpatient Hospital Stay (HOSPITAL_COMMUNITY)
Admission: AD | Admit: 2011-04-23 | Discharge: 2011-04-25 | DRG: 781 | Disposition: A | Discharge status: Home / Self Care | Payer: Medicaid Other | Source: Ambulatory Visit | Attending: Obstetrics and Gynecology | Admitting: Obstetrics and Gynecology

## 2011-04-23 ENCOUNTER — Inpatient Hospital Stay (HOSPITAL_COMMUNITY): Payer: Medicaid Other

## 2011-04-23 ENCOUNTER — Encounter (HOSPITAL_COMMUNITY): Payer: Self-pay | Admitting: *Deleted

## 2011-04-23 DIAGNOSIS — O368190 Decreased fetal movements, unspecified trimester, not applicable or unspecified: Secondary | ICD-10-CM | POA: Insufficient documentation

## 2011-04-23 DIAGNOSIS — E669 Obesity, unspecified: Secondary | ICD-10-CM | POA: Diagnosis present

## 2011-04-23 DIAGNOSIS — O10019 Pre-existing essential hypertension complicating pregnancy, unspecified trimester: Secondary | ICD-10-CM | POA: Insufficient documentation

## 2011-04-23 DIAGNOSIS — B373 Candidiasis of vulva and vagina: Secondary | ICD-10-CM

## 2011-04-23 HISTORY — DX: Obesity, unspecified: E66.9

## 2011-04-23 LAB — URINALYSIS, ROUTINE W REFLEX MICROSCOPIC
Bilirubin Urine: NEGATIVE
Bilirubin Urine: NEGATIVE
Hgb urine dipstick: NEGATIVE
Ketones, ur: NEGATIVE mg/dL
Nitrite: NEGATIVE
Nitrite: NEGATIVE
Protein, ur: NEGATIVE mg/dL
Specific Gravity, Urine: 1.02 (ref 1.005–1.030)
Urobilinogen, UA: 0.2 mg/dL (ref 0.0–1.0)
pH: 6.5 (ref 5.0–8.0)

## 2011-04-23 LAB — URINE MICROSCOPIC-ADD ON

## 2011-04-23 MED ORDER — LABETALOL HCL 200 MG PO TABS
200.0000 mg | ORAL_TABLET | Freq: Two times a day (BID) | ORAL | Status: DC
  Administered 2011-04-23 – 2011-04-25 (×4): 200 mg via ORAL
  Filled 2011-04-23 (×6): qty 1

## 2011-04-23 MED ORDER — ZOLPIDEM TARTRATE 10 MG PO TABS
10.0000 mg | ORAL_TABLET | Freq: Every evening | ORAL | Status: DC | PRN
  Administered 2011-04-23 – 2011-04-24 (×2): 10 mg via ORAL
  Filled 2011-04-23 (×2): qty 1

## 2011-04-23 MED ORDER — PRENATAL PLUS 27-1 MG PO TABS: 1.0000 | ORAL_TABLET | Freq: Every day | ORAL | Status: DC

## 2011-04-23 MED ORDER — ACETAMINOPHEN 325 MG PO TABS: 650.0000 mg | ORAL_TABLET | ORAL | Status: DC | PRN

## 2011-04-23 MED ORDER — DOCUSATE SODIUM 100 MG PO CAPS
100.0000 mg | ORAL_CAPSULE | Freq: Every day | ORAL | Status: DC
  Filled 2011-04-23 (×3): qty 1

## 2011-04-23 MED ORDER — LACTATED RINGERS IV SOLN
INTRAVENOUS | Status: DC
  Administered 2011-04-23 – 2011-04-24 (×2): via INTRAVENOUS

## 2011-04-23 MED ORDER — CALCIUM CARBONATE ANTACID 500 MG PO CHEW: 2.0000 | CHEWABLE_TABLET | ORAL | Status: DC | PRN

## 2011-04-23 MED ORDER — ACETAMINOPHEN 325 MG PO TABS
650.0000 mg | ORAL_TABLET | Freq: Four times a day (QID) | ORAL | Status: DC | PRN
  Administered 2011-04-24: 650 mg via ORAL
  Filled 2011-04-23: qty 2

## 2011-04-23 MED ORDER — VALACYCLOVIR HCL 500 MG PO TABS
500.0000 mg | ORAL_TABLET | Freq: Two times a day (BID) | ORAL | Status: DC
  Administered 2011-04-24 – 2011-04-25 (×3): 500 mg via ORAL
  Filled 2011-04-23 (×6): qty 1

## 2011-04-23 MED ORDER — PRENATAL PLUS 27-1 MG PO TABS
1.0000 | ORAL_TABLET | Freq: Every day | ORAL | Status: DC
  Administered 2011-04-24 – 2011-04-25 (×2): 1 via ORAL
  Filled 2011-04-23 (×3): qty 1

## 2011-04-23 NOTE — Progress Notes (Signed)
Patient state she has not felt any fetal movement since 0500. Had one episode of vomiting and a little pain at 0530, none now.

## 2011-04-23 NOTE — ED Provider Notes (Signed)
History   Rosemaria Moskowitz is a 20y.o. Morbidly obese SBF primagravida at 31.3 weeks who presents this AM w/ CC of decreased FM.  Has noted FM since arrival to MAU.  Reports episode of emesis after eating a pancake this AM; none since.  Only other c/o is yellow d/c when asked specifically r/e any VB, LOF, or abnl d/c, and reports episodes of "tightening" of abdomen yesterday, more than usual.  Had growth u/s at [facility]FM 1 week ago and Growth 80%, AFI 54%, & cx length=3.9cm.  Pt denies Intercourse in last 24 hrs.  No resp c/o's or UTI or PIH s/s.  No fever or chills.  Followed by MD service at [facility].  Hx r/f:  1.  Morbidly obese w/ elevated HgA1c at NOB  2.  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  Scabies earlier this pregnancy  4.  GBS pos in urine  5.  1st trimester Trich  6.  2nd trimester spotting  7.  Smoking cessation w/ pos UPT  8.  Poor dietary choices w/ sedentary lifestyle  9.  CHTN vs GHTN and on Labetalol 200mg  po bid  Chief Complaint  Patient presents with  . Decreased Fetal Movement   HPI  OB History    Grav Para Term Preterm Abortions TAB SAB Ect Mult Living   1 0 0 0 0 0 0 0 0 0       Past Medical History  Diagnosis Date  . Herpes, genital   . Hypertension   . Pregnancy induced hypertension   . Obesity     Past Surgical History  Procedure Date  . No past surgeries     Family History  Problem Relation Age of Onset  . Diabetes Mother   . Obesity Mother   . Asthma Sister   . Obesity Sister   . Diabetes Maternal Aunt   . Obesity Maternal Aunt   . Diabetes Maternal Grandmother     History  Substance Use Topics  . Smoking status: Former Smoker -- 2 years    Types: Cigarettes    Quit date: 12/13/2010  . Smokeless tobacco: Never Used  . Alcohol Use: No    Allergies: No Known Allergies  Prescriptions prior to admission  Medication Sig Dispense Refill  . labetalol (NORMODYNE) 100 MG tablet Take 200 mg by mouth 2 (two) times daily.        . prenatal vitamin w/FE, FA  (PRENATAL 1 + 1) 27-1 MG TABS Take 1 tablet by mouth daily.        Marland Kitchen DISCONTD: labetalol (NORMODYNE) 100 MG tablet Take 2 tablets (200 mg total) by mouth 2 (two) times daily.  60 tablet  3  . valACYclovir (VALTREX) 500 MG tablet Take 500 mg by mouth 2 (two) times daily.          ROS--see HPI Physical Exam  EFM:  150, moderate variability, 1 late, initially NR, but after returning from u/s, reactive TOCO;  Intermittent UI Blood pressure 155/87, pulse 83, temperature 98.8 F (37.1 C), temperature source Oral, resp. rate 16, height 5\' 11"  (1.803 m), weight 172.185 kg (379 lb 9.6 oz), last menstrual period 09/15/2010, SpO2 99.00%.  Physical Exam  Constitutional: She is oriented to person, place, and time. She appears well-developed and well-nourished. No distress.  Cardiovascular: Normal rate.   Respiratory: Effort normal.  GI: Soft. Bowel sounds are normal.  Genitourinary:       Cx:  Long/closed; vestibule inflamed w/ some erythema; no lesions noted; no  blood.  Mod amt white d/c in vault  Neurological: She is alert and oriented to person, place, and time.  Skin: Skin is warm and dry.  Psychiatric: She has a normal mood and affect.  LABS:   .Marland Kitchen Results for orders placed during the hospital encounter of 04/23/11 (from the past 24 hour(s))  URINALYSIS, ROUTINE W REFLEX MICROSCOPIC     Status: Normal   Collection Time   04/23/11  9:40 AM      Component Value Range   Color, Urine YELLOW  YELLOW    Appearance CLEAR  CLEAR    Specific Gravity, Urine 1.020  1.005 - 1.030    pH 6.5  5.0 - 8.0    Glucose, UA NEGATIVE  NEGATIVE (mg/dL)   Hgb urine dipstick NEGATIVE  NEGATIVE    Bilirubin Urine NEGATIVE  NEGATIVE    Ketones, ur NEGATIVE  NEGATIVE (mg/dL)   Protein, ur NEGATIVE  NEGATIVE (mg/dL)   Urobilinogen, UA 0.2  0.0 - 1.0 (mg/dL)   Nitrite NEGATIVE  NEGATIVE    Leukocytes, UA NEGATIVE  NEGATIVE   FETAL FIBRONECTIN     Status: Normal   Collection Time   04/23/11 10:25 AM       Component Value Range   Fetal Fibronectin NEGATIVE  NEGATIVE   WET PREP, GENITAL     Status: Abnormal   Collection Time   04/23/11 10:25 AM      Component Value Range   Yeast, Wet Prep FEW (*) NONE SEEN    Trich, Wet Prep NONE SEEN  NONE SEEN    Clue Cells, Wet Prep NONE SEEN  NONE SEEN    WBC, Wet Prep HPF POC MODERATE (*) NONE SEEN   U/s:  BPP 6/8 (-2 for FBM); vtx, AFI subjectively WNL; placenta posterior above os.  MAU Course  Procedures 1.  Extended external fetal monitoring 2.  U/a 3.  FFN, GC/CT, wet prep 4.  U/s limited for BPP  Assessment and Plan  1.  IUP at 31.3 weeks 2.  8/10 overall fetal testing; reactive NST after extended monitoring 3.  Decreased FM this AM, but movements felt while here 4.  Intermittent UI, but Neg FFN and cx Long/closed 5.  CHTN--stable on Labetalol 200mg  po bid 6.  Few yeast on wet prep--will give Rx for probiotic tomorrow at [facility]  1.  Per c/w dr. Su Hilt, will d/c home and add NST to [facility] already scheduled w/ tomorrow's appt 2.  FKC and PTL precautions rev'd 3.  Continue meds as Rx'd and f/u prn  Vonn Sliger H 04/23/2011, 12:31 PM

## 2011-04-23 NOTE — H&P (Signed)
Andrea Morton is a 20 y.o. female presenting for c/o decreased FM. Pt was seen earlier today in MAU for same complaint. Testing was 8/10, -2 for breathing movements. Pt denies any ctx, VB or LOF.  Pregnancy significant for:  1. Morbidly obese w/ elevated HgA1c at NOB 2. 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 Scabies earlier this pregnancy 4. GBS pos in urine 5. 1st trimester Trich 6. 2nd trimester spotting 7. Smoking cessation w/ pos UPT 8. Poor dietary choices w/ sedentary lifestyle 9. CHTN vs GHTN and on Labetalol 200mg  po bid  Maternal Medical History:  Reason for admission: Extended fetal monitoring, secondary to decreased FM, and antenatal testing 8/10.    Contractions: Denies any ctx now  Fetal activity: Perceived fetal activity is decreased.   Last perceived fetal movement was within the past 12 hours.    Prenatal complications: Hypertension.     OB History    Grav Para Term Preterm Abortions TAB SAB Ect Mult Living   1 0 0 0 0 0 0 0 0 0      Past Medical History  Diagnosis Date  . Herpes, genital   . Hypertension   . Pregnancy induced hypertension   . Obesity    Past Surgical History  Procedure Date  . No past surgeries    Family History: family history includes Asthma in her sister; Diabetes in her maternal aunt, maternal grandmother, and mother; and Obesity in her maternal aunt, mother, and sister. Social History:  reports that she quit smoking about 4 months ago. Her smoking use included Cigarettes. She quit after 2 years of use. She has never used smokeless tobacco. She reports that she does not drink alcohol or use illicit drugs.  Pt is single AA, with HS education, and is unemployed  Review of Systems  All other systems reviewed and are negative.      Blood pressure 146/94, pulse 86, temperature 98.2 F (36.8 C), temperature source Oral, resp. rate 20, height 5\' 11"  (1.803 m), weight 168.738 kg (372 lb), last menstrual period 09/15/2010. Maternal Exam:    Uterine Assessment: Contraction strength is mild.  Contraction frequency is rare.   Abdomen: Patient reports no abdominal tenderness. Introitus: not evaluated.   Cervix: not evaluated.   Fetal Exam Fetal Monitor Review: Mode: ultrasound.   Baseline rate: 150.  Variability: moderate (6-25 bpm).   Pattern: accelerations present and no decelerations.   10x10 accels, rare 15x15  Fetal State Assessment: Category I - tracings are normal.     Physical Exam  Nursing note and vitals reviewed. Constitutional: She is oriented to person, place, and time. She appears well-developed and well-nourished.  Cardiovascular: Normal rate, regular rhythm and normal heart sounds.   Respiratory: Effort normal and breath sounds normal.  GI: Soft.  Musculoskeletal: Normal range of motion.  Neurological: She is alert and oriented to person, place, and time.  Skin: Skin is warm and dry.  Psychiatric: She has a normal mood and affect. Her behavior is normal.    Prenatal labs: ABO, Rh: A/POS/-- (06/19 1425) Antibody: NEG (06/19 1425) Rubella: 12.3 (06/19 1425) RPR: NON REAC (06/19 1425)  HBsAg: NEGATIVE (06/19 1425)  HIV: NON REACTIVE (06/19 1425)  GBS:     Assessment/Plan: Decreased FM  [redacted]w[redacted]d Testing 8/10  Admit to antenatal for observation overnight, Dr Su Hilt as attending [facility] Plan to repeat BPP in the am    Tyrone Balash M 04/23/2011, 9:40 PM

## 2011-04-24 ENCOUNTER — Inpatient Hospital Stay (HOSPITAL_COMMUNITY): Payer: Medicaid Other

## 2011-04-24 LAB — DIFFERENTIAL
Eosinophils Absolute: 0.1 10*3/uL (ref 0.0–0.7)
Lymphs Abs: 1.8 10*3/uL (ref 0.7–4.0)
Monocytes Absolute: 0.5 10*3/uL (ref 0.1–1.0)
Monocytes Relative: 6 % (ref 3–12)
Neutro Abs: 6.6 10*3/uL (ref 1.7–7.7)
Neutrophils Relative %: 73 % (ref 43–77)

## 2011-04-24 LAB — COMPREHENSIVE METABOLIC PANEL
ALT: 20 U/L (ref 0–35)
AST: 26 U/L (ref 0–37)
Albumin: 2.4 g/dL — ABNORMAL LOW (ref 3.5–5.2)
CO2: 22 mEq/L (ref 19–32)
Chloride: 102 mEq/L (ref 96–112)
Creatinine, Ser: 0.65 mg/dL (ref 0.50–1.10)
Potassium: 4 mEq/L (ref 3.5–5.1)
Sodium: 133 mEq/L — ABNORMAL LOW (ref 135–145)
Total Bilirubin: 0.2 mg/dL — ABNORMAL LOW (ref 0.3–1.2)

## 2011-04-24 LAB — GC/CHLAMYDIA PROBE AMP, GENITAL
Chlamydia, DNA Probe: NEGATIVE
GC Probe Amp, Genital: NEGATIVE

## 2011-04-24 LAB — CBC
MCV: 91.5 fL (ref 78.0–100.0)
Platelets: 261 10*3/uL (ref 150–400)
RBC: 3.75 MIL/uL — ABNORMAL LOW (ref 3.87–5.11)
RDW: 14 % (ref 11.5–15.5)
WBC: 9.3 10*3/uL (ref 4.0–10.5)

## 2011-04-24 LAB — URIC ACID: Uric Acid, Serum: 5.1 mg/dL (ref 2.4–7.0)

## 2011-04-24 NOTE — Progress Notes (Signed)
Pt without complaints.  No leakage of fluid or VB.  Good FM Pt denies HA,RUQ pain or visual changes  BP 146/70  Pulse 84  Temp(Src) 98.2 F (36.8 C) (Oral)  Resp 18  Ht 5\' 11"  (1.803 m)  Wt 168.738 kg (372 lb)  BMI 51.88 kg/m2  LMP 09/15/2010  FHTS Baseline: 130-135 bpm reactive with accels.  No decels  Toco none  Pt in NAD CV RRR Lungs CTAB abd  Gravid soft and NT GU no vb EXt no calf tenderness Results for orders placed during the hospital encounter of 04/23/11 (from the past 72 hour(s))  URINALYSIS, ROUTINE W REFLEX MICROSCOPIC     Status: Abnormal   Collection Time   04/23/11  9:10 PM      Component Value Range Comment   Color, Urine YELLOW  YELLOW     Appearance HAZY (*) CLEAR     Specific Gravity, Urine >1.030 (*) 1.005 - 1.030     pH 6.0  5.0 - 8.0     Glucose, UA NEGATIVE  NEGATIVE (mg/dL)    Hgb urine dipstick NEGATIVE  NEGATIVE     Bilirubin Urine NEGATIVE  NEGATIVE     Ketones, ur NEGATIVE  NEGATIVE (mg/dL)    Protein, ur NEGATIVE  NEGATIVE (mg/dL)    Urobilinogen, UA 0.2  0.0 - 1.0 (mg/dL)    Nitrite NEGATIVE  NEGATIVE     Leukocytes, UA SMALL (*) NEGATIVE    URINE MICROSCOPIC-ADD ON     Status: Abnormal   Collection Time   04/23/11  9:10 PM      Component Value Range Comment   Squamous Epithelial / LPF FEW (*) RARE     WBC, UA 3-6  <3 (WBC/hpf)    Bacteria, UA FEW (*) RARE    CBC     Status: Abnormal   Collection Time   04/24/11  1:09 PM      Component Value Range Comment   WBC 9.3  4.0 - 10.5 (K/uL)    RBC 3.75 (*) 3.87 - 5.11 (MIL/uL)    Hemoglobin 11.5 (*) 12.0 - 15.0 (g/dL)    HCT 16.1 (*) 09.6 - 46.0 (%)    MCV 91.5  78.0 - 100.0 (fL)    MCH 30.7  26.0 - 34.0 (pg)    MCHC 33.5  30.0 - 36.0 (g/dL)    RDW 04.5  40.9 - 81.1 (%)    Platelets 261  150 - 400 (K/uL)     Assessment and Plan [redacted]w[redacted]d  CHTN ONE BP was 170/74 will check PIH labs and do 24 hour urine Continue labetalol and to monitor pt

## 2011-04-24 NOTE — Progress Notes (Signed)
Pt vomited large amount of brown emesis

## 2011-04-24 NOTE — Plan of Care (Signed)
Problem: Consults Goal: Birthing Suites Patient Information Press F2 to bring up selections list   Pt < [redacted] weeks EGA     

## 2011-04-24 NOTE — Progress Notes (Signed)
Andrea Morton is a 20 y.o. G1P0000 at [redacted]w[redacted]d admitted for decreased fetal movement.  Subjective: Pt feeling more fetal movement today than yesterday.  No further n/v.  Denies PIH s/s.  Requesting IV to be d/c'd.  No VB, LOF, or abnl d/c.  No regular ctxs.  Pt received LR at 120ml/hr over night secondary to decreased po intake yesterday.    Objective: BP 146/70  Pulse 84  Temp(Src) 98.2 F (36.8 C) (Oral)  Resp 18  Ht 5\' 11"  (1.803 m)  Wt 168.738 kg (372 lb)  BMI 51.88 kg/m2  LMP 09/15/2010  .Marland Kitchen Filed Vitals:   04/23/11 1841 04/23/11 1934 04/24/11 0419 04/24/11 0813  BP: 121/55 146/94 135/56 146/70  Pulse: 86 86 83 84  Temp: 98.2 F (36.8 C) 98.2 F (36.8 C) 98.1 F (36.7 C) 98.2 F (36.8 C)  TempSrc: Oral  Oral Oral  Resp: 20 20 18 18   Height: 5\' 11"  (1.803 m)     Weight: 168.738 kg (372 lb)       FHT:  FHR: 150 bpm, variability: moderate,  accelerations:  Present,  decelerations:  Absent UC:   Occasional UI/ctx SVE:    deferred today. PE: Gen:  NAD, A&Ox3; flat, but baseline Abd:  Soft, nt gravid Ext:  WNL; no clonus, DTR's 1+ BLE  U/S:  8/8 BPP; vtx, AFI=17.5 (64%)  Assessment / Plan: 1.  IUP 31.4 2.  elevated BP reading today  3.  Cat 1 FHT  4.  8/8 BPP today  5.  CHTN on Labetalol 200mg  bid currently  Preeclampsia:  no signs or symptoms of toxicity and PIH labs today and starting 24 hr urine collection Fetal Wellbeing:  Category I Pain Control:  n/a I/D:  n/a MD to follow.  Hadyn Azer H 04/24/2011, 1:01 PM

## 2011-04-25 LAB — CREATININE CLEARANCE, URINE, 24 HOUR
Creatinine, 24H Ur: 2066 mg/d — ABNORMAL HIGH (ref 700–1800)
Creatinine, Urine: 114.77 mg/dL
Creatinine: 0.65 mg/dL (ref 0.50–1.10)

## 2011-04-25 NOTE — Progress Notes (Signed)
Subjective: Pt wants to go home.  RN reports pt has taken her monitors off w/o talking to the nurses and laid them to the side.  Also took tape off saline lock and it dislodged.  Denies PIH s/s.  Reports she has not felt fetal movements this morning.  Emesis episode after supper last night.  No VB, LOF, or regular ctxs.  24 hr urine in collection process until 1330.    Objective: BP 116/60  Pulse 87  Temp(Src) 97.6 F (36.4 C) (Oral)  Resp 20  Ht 5\' 11"  (1.803 m)  Wt 172.276 kg (379 lb 12.8 oz)  BMI 52.97 kg/m2  LMP 09/15/2010      FHT:  FHR: 150 bpm, variability: minimal at times, but overall moderate,  accelerations:  Present,  decelerations:  Absent UC:   Rare ctx, occ'l UI SVE:    deferred PE:  Gen:  A&Ox3, NAD, flat but baseline          Abd:  Soft, NT, gravid (on palpation about 30 min ago, unable to palpate fetal movements for 2-3 min period)          Pelvic deferred          Ext:  WNL, DTR's 1+ BLE, no clonus  Assessment / Plan: 1.  IUP at 31.5  2.  Decreased FM last 2 days  3.  CHTN--on Labetalol 200mg  bid  4. Morbidly obese  5.  Cat I FHT w/ 8/8 BPP yesterday  6.  24 hr urine in process  7.  Nml PIH labs yesterday  1.  Continue current POC 2.  MD to follow   Halford Goetzke H 04/25/2011, 11:33 AM

## 2011-04-25 NOTE — Progress Notes (Signed)
S:  Pt is now reporting fetal movements noted since this AM.  She continues to deny any PIH s/s, ctxs, LOF, VB.  She continues to desire d/c home.    O:  .Marland Kitchen Filed Vitals:   04/25/11 1304 04/25/11 1328 04/25/11 1501 04/25/11 1619  BP: 147/75 160/67  135/74  Pulse: 88 83  86  Temp:    98.4 F (36.9 C)  TempSrc:    Oral  Resp: 20 20 20 20   Height:      Weight:       LABS:  .Marland Kitchen Results for orders placed during the hospital encounter of 04/23/11 (from the past 24 hour(s))  CREATININE CLEARANCE, URINE, 24 HOUR     Status: Abnormal   Collection Time   04/25/11  1:25 PM      Component Value Range   Urine Total Volume-CRCL 1800     Collection Interval-CRCL 24     Creatinine, Urine 114.77     Creatinine 0.65  0.50 - 1.10 (mg/dL)   Creatinine, 16X Ur 0960 (*) 700 - 1800 (mg/day)   Creatinine Clearance 221 (*) 75 - 115 (mL/min)   PE:  Unchanged from morning rounds EFM:  150, moderate variability, rare mild variable, reactive TOCO:  Rare ctx  A:  1.  IUP at 31.5  2.  Decreased FM, but Cat I FHT& 8/8 BPP yesterday  3. Morbidly obese  4. CHTN--on Labetalol 200mg  po bid  5.  24 hour urine completed today, partially resulted & awaiting 24 hr protein result  6.  Noncompliant while hospitalized with monitoring and diet recommendations  P:  Per Dr. Stefano Gaul, following most recent reactive NST this afternoon, (following pt again independently d/c'ng her fetal monitors while she ate her lunch), may d/c pt home. PIH precautions, low sodium & low-carbohydrate diet, adequate H2O. Fetal Kick counts again rev'd and to assess daily. Pt to come to MAU this Friday, 04/27/11 for NST & BP check or f/u prn any questions or concerns. She has an appointment at [facility] 04/30/11 for u/s and ROB at 1030.

## 2011-04-25 NOTE — Discharge Summary (Signed)
Physician Discharge Summary  Patient ID: Andrea Morton MRN: [id number] DOB/AGE: 1990-08-15 20 y.o.  Admit date: 04/23/2011 Discharge date: 04/25/2011  Admission Diagnoses: 1.  IUP at 31.3 weeks  2.  Decreased fetal movement 3. CHTN  4.  Morbidly obese    Discharge Diagnoses: 1.  IUP at 31.5 weeks 2. Decreased fetal movement, but Cat I FHT and BPP 8/8 04/24/11 3. CHTN-stable on Labetalol 200mg  po bid overall--few outlier elevations  4.  Nml PIH labs 04/24/11 5. Morbidly obese  6.  Noncompliant with recommended fetal monitoring and diet while hospitalized  7. Pending 24hr urine protein  Active Problems:  Decreased fetal movement   Discharged Condition: stable  Hospital Course: Pt was admitted night of 04/23/11 for fetal monitoring secondary to decreased fetal movement with onset that day.  Pt had been evaluated morning of 04/23/11 in MAU for this complaint, and subsequently d/c'd home after 6/8 BPP, but overall 8/10 fetal testing.  Called office a few hours after leaving MAU, continuing to report decreased fetal movement, and concern, and was instructed to come for overnight observation.  Of note, pt had insufficient intake on 04/23/11 secondary to N/v x1 that morning, but during stay, only 1 other incident, after supper 04/24/11.  BPP repeated morning of 04/24/11, and 8/8, and fetal monitoring Cat I when pt would leave external monitors on, but had elevated BP that morning =146/94, and thus fully admitted for further eval.  Pt started a 24-hr urine at 1330 yesterday and sent off today--24 hour protein is still pending at pt's time of discharge today, and plan to call pt with the result and to follow-up.  PIH labs were drawn yesterday and WNL.  As hospital stay extended, pt recurrently and independently removed her external fetal monitors.  Despite repeated education on diet and H20 intake, also continues to make poor intake choices.  After lunch, she agreed to NST, and was reactive and WNL, and  Dr. Stefano Gaul deemed pt stable for d/c home.  PIH precautions and FKC extensively reviewed.  She is to continue previous home meds as prescribed, without modifications at present.    Consults: none  Significant Diagnostic Studies: labs: PIH labs & 24 hr urine and radiology: Ultrasound: BPP  Treatments: IV hydration night of 04/23/11  Discharge Exam: Blood pressure 135/74, pulse 86, temperature 98.4 F (36.9 C), temperature source Oral, resp. rate 20, height 5\' 11"  (1.803 m), weight 172.276 kg (379 lb 12.8 oz), last menstrual period 09/15/2010. see progress notes from today  Disposition: Home or Self Care  Discharge Orders    Future Appointments: Provider: Department: Dept Phone: Center:   05/14/2011 8:30 AM [facility] [place] [facility] [phone] [facility]     Current Discharge Medication List    CONTINUE these medications which have NOT CHANGED   Details  acetaminophen (TYLENOL) 325 MG tablet Take 650 mg by mouth every 6 (six) hours as needed. For pain     labetalol (NORMODYNE) 100 MG tablet Take 200 mg by mouth 2 (two) times daily.      prenatal vitamin w/FE, FA (PRENATAL 1 + 1) 27-1 MG TABS Take 1 tablet by mouth daily.      valACYclovir (VALTREX) 500 MG tablet Take 500 mg by mouth 2 (two) times daily.         Follow-up Information    Follow up with [facility] on 04/27/2011. (for Non-stress test and BP check)    Contact information:   [address] [place]  [address] [phone]      Follow up with [facility] on 04/30/2011. (appointment and u/s for BPP at 1030, or call  as needed if symptoms worsen)          Signed: Antonietta Breach 04/25/2011, 4:37 PM

## 2011-04-26 ENCOUNTER — Other Ambulatory Visit: Payer: Self-pay

## 2011-04-26 LAB — CULTURE, BETA STREP (GROUP B ONLY)

## 2011-04-27 ENCOUNTER — Inpatient Hospital Stay (HOSPITAL_COMMUNITY)
Admission: AD | Admit: 2011-04-27 | Discharge: 2011-04-27 | Disposition: A | Discharge status: Home / Self Care | Payer: Medicaid Other | Source: Ambulatory Visit | Attending: Obstetrics and Gynecology | Admitting: Obstetrics and Gynecology

## 2011-04-27 DIAGNOSIS — O368190 Decreased fetal movements, unspecified trimester, not applicable or unspecified: Secondary | ICD-10-CM | POA: Insufficient documentation

## 2011-04-27 DIAGNOSIS — E669 Obesity, unspecified: Secondary | ICD-10-CM | POA: Insufficient documentation

## 2011-04-27 LAB — PROTEIN, URINE, 24 HOUR: Urine Total Volume-UPROT: 1800 mL

## 2011-04-27 MED ORDER — LABETALOL HCL 100 MG PO TABS
100.0000 mg | ORAL_TABLET | Freq: Once | ORAL | Status: AC
  Administered 2011-04-27: 200 mg via ORAL
  Filled 2011-04-27: qty 1

## 2011-04-27 MED ORDER — LABETALOL HCL 100 MG PO TABS
100.0000 mg | ORAL_TABLET | Freq: Once | ORAL | Status: DC
  Filled 2011-04-27: qty 1

## 2011-04-27 NOTE — ED Provider Notes (Signed)
20 yo G1P0 at 32 weeks presented today for NST following hospitalization 11/19 through 11/21 for decreased FM and PIH work-up.  Pregnancy significant for:  1. Morbidly obese w/ elevated HgA1c at NOB 2. 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 Scabies earlier this pregnancy 4. GBS pos in urine 5. 1st trimester Trich 6. 2nd trimester spotting 7. Smoking cessation w/ pos UPT 8. Poor dietary choices w/ sedentary lifestyle 9. CHTN vs GHTN and on Labetalol 200mg  po bid (hasn't taken tonight's dose)  Filed Vitals:   04/27/11 1851  BP: 172/82  Pulse: 88  Temp:   Resp: 22   Patient also angry over family issue.  Repeat BP 150/70 after Labetolol dose given before d/c.  NST reactive, baseline 150. (consistent with tracings during recent hospitalization) Gave Labetolol 200 mg po BID before discharge (patient needs to get Labetolol refilled tonight).  D/C home, with plan for follow-up at office on Monday.  Nigel Bridgeman, CNM, [place] 04/27/11 1710

## 2011-04-30 ENCOUNTER — Inpatient Hospital Stay (HOSPITAL_COMMUNITY)
Admission: AD | Admit: 2011-04-30 | Discharge: 2011-04-30 | Disposition: A | Discharge status: Home / Self Care | Payer: Medicaid Other | Source: Ambulatory Visit | Attending: Obstetrics and Gynecology | Admitting: Obstetrics and Gynecology

## 2011-04-30 ENCOUNTER — Encounter (HOSPITAL_COMMUNITY): Payer: Self-pay | Admitting: *Deleted

## 2011-04-30 DIAGNOSIS — O10919 Unspecified pre-existing hypertension complicating pregnancy, unspecified trimester: Secondary | ICD-10-CM

## 2011-04-30 DIAGNOSIS — O10019 Pre-existing essential hypertension complicating pregnancy, unspecified trimester: Secondary | ICD-10-CM | POA: Insufficient documentation

## 2011-04-30 LAB — CBC
Hemoglobin: 11.6 g/dL — ABNORMAL LOW (ref 12.0–15.0)
MCH: 30.8 pg (ref 26.0–34.0)
MCV: 91.5 fL (ref 78.0–100.0)
RBC: 3.77 MIL/uL — ABNORMAL LOW (ref 3.87–5.11)

## 2011-04-30 LAB — COMPREHENSIVE METABOLIC PANEL
BUN: 8 mg/dL (ref 6–23)
CO2: 19 mEq/L (ref 19–32)
Calcium: 10 mg/dL (ref 8.4–10.5)
Creatinine, Ser: 0.79 mg/dL (ref 0.50–1.10)
GFR calc Af Amer: >90 mL/min (ref >90)
GFR calc non Af Amer: >90 mL/min (ref >90)
Glucose, Bld: 127 mg/dL — ABNORMAL HIGH (ref 70–99)

## 2011-04-30 LAB — URINALYSIS, ROUTINE W REFLEX MICROSCOPIC
Glucose, UA: NEGATIVE mg/dL
Ketones, ur: 15 mg/dL — AB
Nitrite: NEGATIVE
Protein, ur: 30 mg/dL — AB

## 2011-04-30 LAB — LACTATE DEHYDROGENASE: LDH: 184 U/L (ref 94–250)

## 2011-04-30 LAB — URIC ACID: Uric Acid, Serum: 6.8 mg/dL (ref 2.4–7.0)

## 2011-04-30 MED ORDER — CYCLOBENZAPRINE HCL 10 MG PO TABS: 10.0000 mg | ORAL_TABLET | Freq: Once | ORAL | Status: DC

## 2011-04-30 MED ORDER — CYCLOBENZAPRINE HCL 10 MG PO TABS
10.0000 mg | ORAL_TABLET | Freq: Once | ORAL | Status: AC
  Administered 2011-04-30: 10 mg via ORAL
  Filled 2011-04-30: qty 1

## 2011-04-30 MED ORDER — CYCLOBENZAPRINE HCL 10 MG PO TABS: 10.0000 mg | ORAL_TABLET | Freq: Three times a day (TID) | ORAL | Status: DC | PRN

## 2011-04-30 NOTE — Progress Notes (Signed)
Pt sent from MD office for PIH eval, bp in office 160/100's, denies vag bleeding or vag d/c changes, +FM, has h/a rates 7/10, no meds taken thus far. Denies other types of pain, no dizziness or blurred vision.

## 2011-04-30 NOTE — ED Provider Notes (Signed)
History   20 yo G1P0 at 109 3/7 weeks presented from office for [facility] evaluation due to 2+ proteinuria at office visit today.  Had reactive NST at office with BPP today.  Seen last week for antenatal admission due to decreased FM, with normal PIH labs and 24 hour urine of 124 mg.  On Labetolol 200 mg po BID--"prescription ran out over the weekend, didn't get filled till last night".  Does report headache--has had in past, has Tylenol with codeine, but "makes baby not move", so has not taken.  Pregnancy remarkable for 1. Morbidly obese w/ elevated HgA1c at NOB  2. 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 Scabies earlier this pregnancy  4. GBS pos in urine  5. 1st trimester Trich  6. 2nd trimester spotting  7. Smoking cessation w/ pos UPT  8. Poor dietary choices w/ sedentary lifestyle  9. CHTN vs GHTN and on Labetalol 200mg  po bid     Chief Complaint  Patient presents with  . Hypertension     OB History    Grav Para Term Preterm Abortions TAB SAB Ect Mult Living   1 0 0 0 0 0 0 0 0 0       Past Medical History  Diagnosis Date  . Herpes, genital   . Hypertension   . Pregnancy induced hypertension   . Obesity     Past Surgical History  Procedure Date  . No past surgeries     Family History  Problem Relation Age of Onset  . Diabetes Mother   . Obesity Mother   . Asthma Sister   . Obesity Sister   . Diabetes Maternal Aunt   . Obesity Maternal Aunt   . Diabetes Maternal Grandmother     History  Substance Use Topics  . Smoking status: Former Smoker -- 2 years    Types: Cigarettes    Quit date: 12/13/2010  . Smokeless tobacco: Never Used  . Alcohol Use: No    Allergies: No Known Allergies  Prescriptions prior to admission  Medication Sig Dispense Refill  . acetaminophen (TYLENOL) 325 MG tablet Take 650 mg by mouth every 6 (six) hours as needed. For pain      . acetaminophen-codeine (TYLENOL #3) 300-30 MG per tablet Take 1 tablet by mouth daily as needed. Severe  pain        . labetalol (NORMODYNE) 200 MG tablet Take 200 mg by mouth 2 (two) times daily.        . prenatal vitamin w/FE, FA (PRENATAL 1 + 1) 27-1 MG TABS Take 1 tablet by mouth daily.       . valACYclovir (VALTREX) 500 MG tablet Take 500 mg by mouth 2 (two) times daily. As needed for outbreaks         Physical Exam   Blood pressure 137/81, pulse 88, temperature 98.3 F (36.8 C), temperature source Oral, resp. rate 16, height 5\' 11"  (1.803 m), weight 176.676 kg (389 lb 8 oz), last menstrual period 09/15/2010.  Filed Vitals:   04/30/11 1450 04/30/11 1500 04/30/11 1510 04/30/11 1550  BP: 139/75 150/87 137/81 156/88  Pulse: 106 87 88 85  Temp:      TempSrc:      Resp:    18  Height:      Weight:        Results for orders placed during the hospital encounter of 04/30/11 (from the past 24 hour(s))  URINALYSIS, ROUTINE W REFLEX MICROSCOPIC     Status: Abnormal  Collection Time   04/30/11  1:59 PM      Component Value Range   Color, Urine YELLOW  YELLOW    Appearance HAZY (*) CLEAR    Specific Gravity, Urine 1.025  1.005 - 1.030    pH 6.0  5.0 - 8.0    Glucose, UA NEGATIVE  NEGATIVE (mg/dL)   Hgb urine dipstick NEGATIVE  NEGATIVE    Bilirubin Urine NEGATIVE  NEGATIVE    Ketones, ur 15 (*) NEGATIVE (mg/dL)   Protein, ur 30 (*) NEGATIVE (mg/dL)   Urobilinogen, UA 0.2  0.0 - 1.0 (mg/dL)   Nitrite NEGATIVE  NEGATIVE    Leukocytes, UA LARGE (*) NEGATIVE   URINE MICROSCOPIC-ADD ON     Status: Abnormal   Collection Time   04/30/11  1:59 PM      Component Value Range   Squamous Epithelial / LPF MANY (*) RARE    WBC, UA 7-10  <3 (WBC/hpf)   Bacteria, UA MANY (*) RARE   CBC     Status: Abnormal   Collection Time   04/30/11  2:04 PM      Component Value Range   WBC 9.2  4.0 - 10.5 (K/uL)   RBC 3.77 (*) 3.87 - 5.11 (MIL/uL)   Hemoglobin 11.6 (*) 12.0 - 15.0 (g/dL)   HCT 16.1 (*) 09.6 - 46.0 (%)   MCV 91.5  78.0 - 100.0 (fL)   MCH 30.8  26.0 - 34.0 (pg)   MCHC 33.6  30.0 -  36.0 (g/dL)   RDW 04.5  40.9 - 81.1 (%)   Platelets 252  150 - 400 (K/uL)  COMPREHENSIVE METABOLIC PANEL     Status: Abnormal   Collection Time   04/30/11  2:04 PM      Component Value Range   Sodium 131 (*) 135 - 145 (mEq/L)   Potassium 3.5  3.5 - 5.1 (mEq/L)   Chloride 101  96 - 112 (mEq/L)   CO2 19  19 - 32 (mEq/L)   Glucose, Bld 127 (*) 70 - 99 (mg/dL)   BUN 8  6 - 23 (mg/dL)   Creatinine, Ser 9.14  0.50 - 1.10 (mg/dL)   Calcium 78.2  8.4 - 10.5 (mg/dL)   Total Protein 6.2  6.0 - 8.3 (g/dL)   Albumin 2.4 (*) 3.5 - 5.2 (g/dL)   AST 29  0 - 37 (U/L)   ALT 26  0 - 35 (U/L)   Alkaline Phosphatase 99  39 - 117 (U/L)   Total Bilirubin 0.2 (*) 0.3 - 1.2 (mg/dL)   GFR calc non Af Amer >90  >90 (mL/min)   GFR calc Af Amer >90  >90 (mL/min)  LACTATE DEHYDROGENASE     Status: Normal   Collection Time   04/30/11  2:04 PM      Component Value Range   LD 184  94 - 250 (U/L)  URIC ACID     Status: Normal   Collection Time   04/30/11  2:04 PM      Component Value Range   Uric Acid, Serum 6.8  2.4 - 7.0 (mg/dL)   Chest clear Heart RRR without murmur Abd gravid, NT Ext DTR 2+ without clonus, 1+ edema FHR reactive, no decels No UCs  ED Course  IUP at 32 3/7 weeks Chronic hypertension--on Labetolol 200 mg po BID Normal PIH labs Proteinuria on a voided, dirty specimen  Plan: Reviewed need for patient to maintain bedrest and medication regimen--if unable to do that, patient will  need admission for 24 hour urine.  Patient advises she will be able to do these recommendations, and desires d/c home to collect 24 hour urine. D/C home in consult with Dr. Estanislado Pandy Continue bedrest, Labetolol 200 mg po BID Start 24 hour urine at 2pm today, continue to 2pm tomorrow, then come to [facility] to return and get BP check. Patient agreeable with plan. PIH precautions reviewed.  Nigel Bridgeman, CNM, [place] 04/30/11 4pm

## 2011-05-01 ENCOUNTER — Encounter (HOSPITAL_COMMUNITY): Payer: Self-pay | Admitting: *Deleted

## 2011-05-01 ENCOUNTER — Inpatient Hospital Stay (HOSPITAL_COMMUNITY)
Admission: AD | Admit: 2011-05-01 | Discharge: 2011-05-01 | Disposition: A | Discharge status: Home / Self Care | Payer: Medicaid Other | Source: Ambulatory Visit | Attending: Obstetrics and Gynecology | Admitting: Obstetrics and Gynecology

## 2011-05-01 DIAGNOSIS — O10019 Pre-existing essential hypertension complicating pregnancy, unspecified trimester: Secondary | ICD-10-CM | POA: Insufficient documentation

## 2011-05-01 DIAGNOSIS — Z22330 Carrier of Group B streptococcus: Secondary | ICD-10-CM

## 2011-05-01 DIAGNOSIS — Z3490 Encounter for supervision of normal pregnancy, unspecified, unspecified trimester: Secondary | ICD-10-CM

## 2011-05-01 MED ORDER — LABETALOL HCL 200 MG PO TABS: 200.0000 mg | ORAL_TABLET | Freq: Three times a day (TID) | ORAL | Status: DC

## 2011-05-01 MED ORDER — LABETALOL HCL 100 MG PO TABS
200.0000 mg | ORAL_TABLET | Freq: Once | ORAL | Status: AC
  Administered 2011-05-01: 200 mg via ORAL
  Filled 2011-05-01: qty 2

## 2011-05-01 NOTE — ED Provider Notes (Signed)
History     Chief Complaint  Patient presents with  . Hypertension   HPI Comments: Pt is a G1P0 at [redacted]w[redacted]d that was sent from office today after turning in 24hr urine and a BP check, at office was 180's/100's, had a reactive NST. Was seen in MAU on 11/26 for PIH eval with normal lab results and stable BP. 24 hour urine from 11/21 was 180's. She denies any c/o today other than headache that she has had before. She denies ctx, VB or lof and has +FM.  She states she had taken labetalol this AM but has not had a second dose.  Pregnancy remarkable for 1. Morbidly obese w/ elevated HgA1c at NOB  2. 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 Scabies earlier this pregnancy  4. GBS pos in urine   5. 1st trimester Trich   6. 2nd trimester spotting   7. Smoking cessation w/ pos UPT   8. Poor dietary choices w/ sedentary lifestyle   9. CHTN vs GHTN and on Labetalol 200mg  po bid      Hypertension Associated symptoms include headaches.      Past Medical History  Diagnosis Date  . Herpes, genital   . Hypertension   . Pregnancy induced hypertension   . Obesity     Past Surgical History  Procedure Date  . No past surgeries     Family History  Problem Relation Age of Onset  . Diabetes Mother   . Obesity Mother   . Asthma Sister   . Obesity Sister   . Diabetes Maternal Aunt   . Obesity Maternal Aunt   . Diabetes Maternal Grandmother   . Anesthesia problems Neg Hx     History  Substance Use Topics  . Smoking status: Former Smoker -- 2 years    Types: Cigarettes    Quit date: 12/13/2010  . Smokeless tobacco: Never Used  . Alcohol Use: No    Allergies: No Known Allergies  Prescriptions prior to admission  Medication Sig Dispense Refill  . acetaminophen (TYLENOL) 325 MG tablet Take 650 mg by mouth every 6 (six) hours as needed. For pain      . acetaminophen-codeine (TYLENOL #3) 300-30 MG per tablet Take 1 tablet by mouth daily as needed. Severe pain        . labetalol  (NORMODYNE) 200 MG tablet Take 200 mg by mouth 2 (two) times daily.        . prenatal vitamin w/FE, FA (PRENATAL 1 + 1) 27-1 MG TABS Take 1 tablet by mouth daily.       . valACYclovir (VALTREX) 500 MG tablet Take 500 mg by mouth 2 (two) times daily. As needed for outbreaks        Review of Systems  Neurological: Positive for headaches.  All other systems reviewed and are negative.   Physical Exam   Blood pressure 179/84, pulse 78, temperature 99.7 F (37.6 C), temperature source Oral, resp. rate 20, last menstrual period 09/15/2010.  Physical Exam  Nursing note and vitals reviewed. Constitutional: She is oriented to person, place, and time. She appears well-developed and well-nourished.  Neck: Normal range of motion.  Cardiovascular: Normal rate.   Respiratory: Effort normal.  GI: Soft.  Musculoskeletal: Normal range of motion.  Neurological: She is alert and oriented to person, place, and time. She has normal reflexes.  Skin: Skin is warm and dry.  Psychiatric: She has a normal mood and affect. Her behavior is normal.    MAU  Course  Procedures    Assessment and Plan  IUP at [redacted]w[redacted]d CHTN vs. GHTN on lebetalol - with poor compliance Reactive NST at office today BPP at office yesterday  D/W Dr. Pennie Rushing, will do serial BP Give evening dose of lebetalol 200mg  PO Await 24hr urine results   Andrea Morton 05/01/2011, 8:34 PM   11/27 at 2130  24hr urine protein today =238 Filed Vitals:   05/01/11 2040 05/01/11 2055 05/01/11 2110 05/01/11 2111  BP: 166/90 159/83 162/101 161/98  Pulse: 77 74 72 68  Temp:      TempSrc:      Resp:         Discussed results with Dr. Pennie Rushing  Will increase labetalol to 200mg  TID, to take additional dose tonight. F/U in office on Thursday 11/29  Pt given PIH precautions

## 2011-05-01 NOTE — Progress Notes (Addendum)
Pt sent from office, here yesterday for eval.  Turned in 24hr urine.  Had reactive tracing.  Sent over for BP checks while waiting on lab results.  Denies visual changes, epigastric pain or headache

## 2011-05-03 ENCOUNTER — Encounter (HOSPITAL_COMMUNITY)
Admission: AD | Disposition: A | Discharge status: Home / Self Care | Payer: Self-pay | Source: Ambulatory Visit | Attending: Obstetrics and Gynecology

## 2011-05-03 ENCOUNTER — Encounter (HOSPITAL_COMMUNITY): Payer: Self-pay | Admitting: *Deleted

## 2011-05-03 ENCOUNTER — Encounter (HOSPITAL_COMMUNITY): Payer: Self-pay | Admitting: Anesthesiology

## 2011-05-03 ENCOUNTER — Inpatient Hospital Stay (HOSPITAL_COMMUNITY): Payer: Medicaid Other

## 2011-05-03 ENCOUNTER — Inpatient Hospital Stay (HOSPITAL_COMMUNITY): Payer: Medicaid Other | Admitting: Anesthesiology

## 2011-05-03 ENCOUNTER — Other Ambulatory Visit: Payer: Self-pay | Admitting: Obstetrics and Gynecology

## 2011-05-03 ENCOUNTER — Inpatient Hospital Stay (HOSPITAL_COMMUNITY)
Admission: AD | Admit: 2011-05-03 | Discharge: 2011-05-06 | DRG: 765 | Disposition: A | Discharge status: Home / Self Care | Payer: Medicaid Other | Source: Ambulatory Visit | Attending: Obstetrics and Gynecology | Admitting: Obstetrics and Gynecology

## 2011-05-03 DIAGNOSIS — E669 Obesity, unspecified: Secondary | ICD-10-CM | POA: Diagnosis present

## 2011-05-03 DIAGNOSIS — O1410 Severe pre-eclampsia, unspecified trimester: Secondary | ICD-10-CM | POA: Diagnosis present

## 2011-05-03 DIAGNOSIS — O1414 Severe pre-eclampsia complicating childbirth: Principal | ICD-10-CM | POA: Diagnosis present

## 2011-05-03 DIAGNOSIS — O99892 Other specified diseases and conditions complicating childbirth: Secondary | ICD-10-CM | POA: Diagnosis present

## 2011-05-03 DIAGNOSIS — Z22330 Carrier of Group B streptococcus: Secondary | ICD-10-CM

## 2011-05-03 LAB — COMPREHENSIVE METABOLIC PANEL
ALT: 31 U/L (ref 0–35)
ALT: 45 U/L — ABNORMAL HIGH (ref 0–35)
AST: 52 U/L — ABNORMAL HIGH (ref 0–37)
Albumin: 2.3 g/dL — ABNORMAL LOW (ref 3.5–5.2)
Albumin: 2.3 g/dL — ABNORMAL LOW (ref 3.5–5.2)
Alkaline Phosphatase: 121 U/L — ABNORMAL HIGH (ref 39–117)
Alkaline Phosphatase: 138 U/L — ABNORMAL HIGH (ref 39–117)
Alkaline Phosphatase: 144 U/L — ABNORMAL HIGH (ref 39–117)
BUN: 7 mg/dL (ref 6–23)
BUN: 7 mg/dL (ref 6–23)
CO2: 23 mEq/L (ref 19–32)
Calcium: 9.4 mg/dL (ref 8.4–10.5)
Chloride: 104 mEq/L (ref 96–112)
Creatinine, Ser: 0.8 mg/dL (ref 0.50–1.10)
GFR calc Af Amer: >90 mL/min (ref >90)
GFR calc Af Amer: >90 mL/min (ref >90)
GFR calc non Af Amer: >90 mL/min (ref >90)
GFR calc non Af Amer: >90 mL/min (ref >90)
Glucose, Bld: 89 mg/dL (ref 70–99)
Potassium: 4.5 mEq/L (ref 3.5–5.1)
Potassium: 3.7 mEq/L (ref 3.5–5.1)
Sodium: 135 mEq/L (ref 135–145)
Sodium: 136 mEq/L (ref 135–145)
Total Bilirubin: 0.2 mg/dL — ABNORMAL LOW (ref 0.3–1.2)
Total Protein: 5.6 g/dL — ABNORMAL LOW (ref 6.0–8.3)
Total Protein: 5.8 g/dL — ABNORMAL LOW (ref 6.0–8.3)

## 2011-05-03 LAB — CBC
HCT: 35.1 % — ABNORMAL LOW (ref 36.0–46.0)
HCT: 34.1 % — ABNORMAL LOW (ref 36.0–46.0)
Hemoglobin: 11.8 g/dL — ABNORMAL LOW (ref 12.0–15.0)
MCH: 30.6 pg (ref 26.0–34.0)
MCH: 30.7 pg (ref 26.0–34.0)
MCHC: 33.3 g/dL (ref 30.0–36.0)
MCHC: 33.4 g/dL (ref 30.0–36.0)
MCV: 91.9 fL (ref 78.0–100.0)
RBC: 3.84 MIL/uL — ABNORMAL LOW (ref 3.87–5.11)
RDW: 14 % (ref 11.5–15.5)
RDW: 14.1 % (ref 11.5–15.5)
WBC: 10 10*3/uL (ref 4.0–10.5)

## 2011-05-03 LAB — URINE CULTURE
Colony Count: 1000
Culture  Setup Time: 201211270116
Special Requests: NORMAL

## 2011-05-03 LAB — URINALYSIS, ROUTINE W REFLEX MICROSCOPIC
Glucose, UA: NEGATIVE mg/dL
Hgb urine dipstick: NEGATIVE
Ketones, ur: NEGATIVE mg/dL
Protein, ur: 30 mg/dL — AB
Urobilinogen, UA: 0.2 mg/dL (ref 0.0–1.0)

## 2011-05-03 LAB — URINE MICROSCOPIC-ADD ON

## 2011-05-03 LAB — LACTATE DEHYDROGENASE: LDH: 315 U/L — ABNORMAL HIGH (ref 94–250)

## 2011-05-03 SURGERY — Surgical Case
Anesthesia: Regional | Site: Uterus | Wound class: Clean Contaminated

## 2011-05-03 MED ORDER — MEDROXYPROGESTERONE ACETATE 150 MG/ML IM SUSP
150.0000 mg | INTRAMUSCULAR | Status: AC | PRN
  Administered 2011-05-06: 150 mg via INTRAMUSCULAR
  Filled 2011-05-03: qty 1

## 2011-05-03 MED ORDER — MAGNESIUM SULFATE BOLUS VIA INFUSION
4.0000 g | Freq: Once | INTRAVENOUS | Status: AC
  Administered 2011-05-03: 4 g via INTRAVENOUS
  Filled 2011-05-03: qty 500

## 2011-05-03 MED ORDER — ACETAMINOPHEN 325 MG PO TABS: 325.0000 mg | ORAL_TABLET | ORAL | Status: DC | PRN

## 2011-05-03 MED ORDER — OXYTOCIN 20 UNITS IN LACTATED RINGERS INFUSION - SIMPLE
INTRAVENOUS | Status: DC | PRN
  Administered 2011-05-03: 40 [IU] via INTRAVENOUS

## 2011-05-03 MED ORDER — DOCUSATE SODIUM 100 MG PO CAPS
100.0000 mg | ORAL_CAPSULE | Freq: Every day | ORAL | Status: DC
  Administered 2011-05-04 – 2011-05-05 (×2): 100 mg via ORAL
  Filled 2011-05-03 (×2): qty 1

## 2011-05-03 MED ORDER — LACTATED RINGERS IV SOLN
INTRAVENOUS | Status: DC
  Administered 2011-05-03: 08:00:00 via INTRAVENOUS
  Administered 2011-05-03: 100 mL/h via INTRAVENOUS
  Administered 2011-05-04: 19:00:00 via INTRAVENOUS

## 2011-05-03 MED ORDER — CEFAZOLIN SODIUM 1-5 GM-% IV SOLN
INTRAVENOUS | Status: DC | PRN
  Administered 2011-05-03: 2 g via INTRAVENOUS

## 2011-05-03 MED ORDER — OXYTOCIN 10 UNIT/ML IJ SOLN
INTRAMUSCULAR | Status: AC
  Filled 2011-05-03: qty 4

## 2011-05-03 MED ORDER — PHENYLEPHRINE HCL 10 MG/ML IJ SOLN
INTRAMUSCULAR | Status: DC | PRN
  Administered 2011-05-03: 120 ug via INTRAVENOUS
  Administered 2011-05-03: 40 ug via INTRAVENOUS

## 2011-05-03 MED ORDER — BUPIVACAINE-EPINEPHRINE 0.5% -1:200000 IJ SOLN
INTRAMUSCULAR | Status: DC | PRN
  Administered 2011-05-03: 10 mL

## 2011-05-03 MED ORDER — HYDRALAZINE HCL 20 MG/ML IJ SOLN
5.0000 mg | Freq: Once | INTRAMUSCULAR | Status: AC
  Administered 2011-05-03: 5 mg via INTRAVENOUS

## 2011-05-03 MED ORDER — MAGNESIUM SULFATE 40 G IN LACTATED RINGERS - SIMPLE: 2.0000 g/h | INTRAVENOUS | Status: DC

## 2011-05-03 MED ORDER — TETANUS-DIPHTH-ACELL PERTUSSIS 5-2.5-18.5 LF-MCG/0.5 IM SUSP
0.5000 mL | Freq: Once | INTRAMUSCULAR | Status: DC
  Filled 2011-05-03: qty 0.5

## 2011-05-03 MED ORDER — OXYTOCIN 10 UNIT/ML IJ SOLN
INTRAMUSCULAR | Status: AC
  Filled 2011-05-03: qty 1

## 2011-05-03 MED ORDER — LABETALOL HCL 300 MG PO TABS
300.0000 mg | ORAL_TABLET | Freq: Three times a day (TID) | ORAL | Status: DC
  Administered 2011-05-03 – 2011-05-06 (×10): 300 mg via ORAL
  Filled 2011-05-03 (×14): qty 1

## 2011-05-03 MED ORDER — DIPHENHYDRAMINE HCL 25 MG PO CAPS
25.0000 mg | ORAL_CAPSULE | Freq: Four times a day (QID) | ORAL | Status: DC | PRN
  Administered 2011-05-04: 25 mg via ORAL
  Filled 2011-05-03: qty 1

## 2011-05-03 MED ORDER — FENTANYL CITRATE 0.05 MG/ML IJ SOLN
INTRAMUSCULAR | Status: DC | PRN
  Administered 2011-05-03: 25 ug via INTRATHECAL

## 2011-05-03 MED ORDER — LABETALOL HCL 5 MG/ML IV SOLN
10.0000 mg | INTRAVENOUS | Status: DC | PRN
  Administered 2011-05-03: 40 mg via INTRAVENOUS
  Administered 2011-05-03: 10 mg via INTRAVENOUS
  Administered 2011-05-03: 20 mg via INTRAVENOUS
  Filled 2011-05-03 (×3): qty 8

## 2011-05-03 MED ORDER — NALOXONE HCL 0.4 MG/ML IJ SOLN: 1.0000 ug/kg/h | INTRAMUSCULAR | Status: DC | PRN

## 2011-05-03 MED ORDER — LANOLIN HYDROUS EX OINT: 1.0000 "application " | TOPICAL_OINTMENT | CUTANEOUS | Status: DC | PRN

## 2011-05-03 MED ORDER — MENTHOL 3 MG MT LOZG: 1.0000 | LOZENGE | OROMUCOSAL | Status: DC | PRN

## 2011-05-03 MED ORDER — NALBUPHINE HCL 10 MG/ML IJ SOLN: 5.0000 mg | INTRAMUSCULAR | Status: DC | PRN

## 2011-05-03 MED ORDER — ACETAMINOPHEN 10 MG/ML IV SOLN: 1000.0000 mg | Freq: Four times a day (QID) | INTRAVENOUS | Status: AC | PRN

## 2011-05-03 MED ORDER — MAGNESIUM SULFATE 40 G IN LACTATED RINGERS - SIMPLE
2.0000 g/h | INTRAVENOUS | Status: DC
  Administered 2011-05-03 – 2011-05-04 (×2): 2 g/h via INTRAVENOUS
  Filled 2011-05-03 (×2): qty 500

## 2011-05-03 MED ORDER — LACTATED RINGERS IV SOLN
INTRAVENOUS | Status: DC | PRN
  Administered 2011-05-03 (×2): via INTRAVENOUS

## 2011-05-03 MED ORDER — CEFAZOLIN SODIUM 1-5 GM-% IV SOLN
INTRAVENOUS | Status: AC
  Filled 2011-05-03: qty 100

## 2011-05-03 MED ORDER — MORPHINE SULFATE 0.5 MG/ML IJ SOLN
INTRAMUSCULAR | Status: AC
  Filled 2011-05-03: qty 10

## 2011-05-03 MED ORDER — KETOROLAC TROMETHAMINE 30 MG/ML IJ SOLN
30.0000 mg | Freq: Four times a day (QID) | INTRAMUSCULAR | Status: AC | PRN
  Administered 2011-05-03: 30 mg via INTRAVENOUS

## 2011-05-03 MED ORDER — PRENATAL PLUS 27-1 MG PO TABS
1.0000 | ORAL_TABLET | Freq: Every day | ORAL | Status: DC
  Administered 2011-05-04: 1 via ORAL
  Filled 2011-05-03 (×2): qty 1

## 2011-05-03 MED ORDER — BUPIVACAINE IN DEXTROSE 0.75-8.25 % IT SOLN
INTRATHECAL | Status: DC | PRN
  Administered 2011-05-03: 13 mg via INTRATHECAL

## 2011-05-03 MED ORDER — HYDRALAZINE HCL 20 MG/ML IJ SOLN
INTRAMUSCULAR | Status: AC
  Filled 2011-05-03: qty 1

## 2011-05-03 MED ORDER — SODIUM CHLORIDE 0.9 % IJ SOLN: 3.0000 mL | INTRAMUSCULAR | Status: DC | PRN

## 2011-05-03 MED ORDER — KETOROLAC TROMETHAMINE 30 MG/ML IJ SOLN
INTRAMUSCULAR | Status: AC
  Administered 2011-05-03: 30 mg via INTRAVENOUS
  Filled 2011-05-03: qty 1

## 2011-05-03 MED ORDER — MORPHINE SULFATE (PF) 0.5 MG/ML IJ SOLN
INTRAMUSCULAR | Status: DC | PRN
  Administered 2011-05-03: .1 mg via INTRATHECAL

## 2011-05-03 MED ORDER — SIMETHICONE 80 MG PO CHEW: 80.0000 mg | CHEWABLE_TABLET | ORAL | Status: DC | PRN

## 2011-05-03 MED ORDER — ZOLPIDEM TARTRATE 5 MG PO TABS: 5.0000 mg | ORAL_TABLET | Freq: Every evening | ORAL | Status: DC | PRN

## 2011-05-03 MED ORDER — PRENATAL PLUS 27-1 MG PO TABS
1.0000 | ORAL_TABLET | Freq: Every day | ORAL | Status: DC
  Administered 2011-05-05 – 2011-05-06 (×2): 1 via ORAL
  Filled 2011-05-03: qty 1

## 2011-05-03 MED ORDER — ZOLPIDEM TARTRATE 10 MG PO TABS: 10.0000 mg | ORAL_TABLET | Freq: Every evening | ORAL | Status: DC | PRN

## 2011-05-03 MED ORDER — MEPERIDINE HCL 25 MG/ML IJ SOLN: 6.2500 mg | INTRAMUSCULAR | Status: DC | PRN

## 2011-05-03 MED ORDER — ONDANSETRON HCL 4 MG/2ML IJ SOLN
INTRAMUSCULAR | Status: AC
  Filled 2011-05-03: qty 2

## 2011-05-03 MED ORDER — MEASLES, MUMPS & RUBELLA VAC ~~LOC~~ INJ: 0.5000 mL | INJECTION | Freq: Once | SUBCUTANEOUS | Status: DC

## 2011-05-03 MED ORDER — SCOPOLAMINE 1 MG/3DAYS TD PT72
MEDICATED_PATCH | TRANSDERMAL | Status: AC
  Filled 2011-05-03: qty 1

## 2011-05-03 MED ORDER — FENTANYL CITRATE 0.05 MG/ML IJ SOLN
INTRAMUSCULAR | Status: AC
  Filled 2011-05-03: qty 2

## 2011-05-03 MED ORDER — IBUPROFEN 600 MG PO TABS
600.0000 mg | ORAL_TABLET | Freq: Four times a day (QID) | ORAL | Status: DC | PRN
  Administered 2011-05-04: 600 mg via ORAL
  Filled 2011-05-03 (×4): qty 1

## 2011-05-03 MED ORDER — CITRIC ACID-SODIUM CITRATE 334-500 MG/5ML PO SOLN
ORAL | Status: AC
  Administered 2011-05-03: 30 mL
  Filled 2011-05-03: qty 15

## 2011-05-03 MED ORDER — ONDANSETRON HCL 4 MG/2ML IJ SOLN
INTRAMUSCULAR | Status: DC | PRN
  Administered 2011-05-03: 4 mg via INTRAVENOUS

## 2011-05-03 MED ORDER — ACETAMINOPHEN 325 MG PO TABS: 650.0000 mg | ORAL_TABLET | ORAL | Status: DC | PRN

## 2011-05-03 MED ORDER — WITCH HAZEL-GLYCERIN EX PADS: 1.0000 "application " | MEDICATED_PAD | CUTANEOUS | Status: DC | PRN

## 2011-05-03 MED ORDER — LACTATED RINGERS IV SOLN
INTRAVENOUS | Status: DC
  Administered 2011-05-04: 09:00:00 via INTRAVENOUS

## 2011-05-03 MED ORDER — SIMETHICONE 80 MG PO CHEW
80.0000 mg | CHEWABLE_TABLET | Freq: Three times a day (TID) | ORAL | Status: DC
  Administered 2011-05-03 – 2011-05-06 (×11): 80 mg via ORAL

## 2011-05-03 MED ORDER — IBUPROFEN 600 MG PO TABS
600.0000 mg | ORAL_TABLET | Freq: Four times a day (QID) | ORAL | Status: DC
  Administered 2011-05-03 – 2011-05-06 (×10): 600 mg via ORAL
  Filled 2011-05-03 (×7): qty 1

## 2011-05-03 MED ORDER — DIBUCAINE 1 % RE OINT: 1.0000 "application " | TOPICAL_OINTMENT | RECTAL | Status: DC | PRN

## 2011-05-03 MED ORDER — KETOROLAC TROMETHAMINE 30 MG/ML IJ SOLN: 30.0000 mg | Freq: Four times a day (QID) | INTRAMUSCULAR | Status: AC | PRN

## 2011-05-03 MED ORDER — ONDANSETRON HCL 4 MG/2ML IJ SOLN: 4.0000 mg | INTRAMUSCULAR | Status: DC | PRN

## 2011-05-03 MED ORDER — HYDRALAZINE HCL 20 MG/ML IJ SOLN
5.0000 mg | Freq: Once | INTRAMUSCULAR | Status: AC
  Administered 2011-05-03: 5 mg via INTRAVENOUS
  Filled 2011-05-03: qty 1

## 2011-05-03 MED ORDER — ONDANSETRON HCL 4 MG PO TABS: 4.0000 mg | ORAL_TABLET | ORAL | Status: DC | PRN

## 2011-05-03 MED ORDER — NALOXONE HCL 0.4 MG/ML IJ SOLN: 0.4000 mg | INTRAMUSCULAR | Status: DC | PRN

## 2011-05-03 MED ORDER — CALCIUM CARBONATE ANTACID 500 MG PO CHEW: 2.0000 | CHEWABLE_TABLET | ORAL | Status: DC | PRN

## 2011-05-03 MED ORDER — OXYCODONE-ACETAMINOPHEN 5-325 MG PO TABS
1.0000 | ORAL_TABLET | ORAL | Status: DC | PRN
  Administered 2011-05-04 – 2011-05-05 (×6): 1 via ORAL
  Administered 2011-05-05: 2 via ORAL
  Filled 2011-05-03 (×5): qty 1
  Filled 2011-05-03: qty 2
  Filled 2011-05-03: qty 1

## 2011-05-03 MED ORDER — SCOPOLAMINE 1 MG/3DAYS TD PT72
1.0000 | MEDICATED_PATCH | Freq: Once | TRANSDERMAL | Status: AC
  Administered 2011-05-03: 1.5 mg via TRANSDERMAL

## 2011-05-03 MED ORDER — PHENYLEPHRINE 40 MCG/ML (10ML) SYRINGE FOR IV PUSH (FOR BLOOD PRESSURE SUPPORT)
PREFILLED_SYRINGE | INTRAVENOUS | Status: AC
  Filled 2011-05-03: qty 5

## 2011-05-03 MED ORDER — SENNOSIDES-DOCUSATE SODIUM 8.6-50 MG PO TABS
2.0000 | ORAL_TABLET | Freq: Every day | ORAL | Status: DC
  Administered 2011-05-03 – 2011-05-05 (×3): 2 via ORAL

## 2011-05-03 MED ORDER — OXYTOCIN 20 UNITS IN LACTATED RINGERS INFUSION - SIMPLE
125.0000 mL/h | INTRAVENOUS | Status: AC
  Administered 2011-05-04: 125 mL/h via INTRAVENOUS
  Filled 2011-05-03 (×2): qty 1000

## 2011-05-03 MED ORDER — FENTANYL CITRATE 0.05 MG/ML IJ SOLN: 25.0000 ug | INTRAMUSCULAR | Status: DC | PRN

## 2011-05-03 MED ORDER — HYDRALAZINE HCL 20 MG/ML IJ SOLN: 5.0000 mg | Freq: Once | INTRAMUSCULAR | Status: DC

## 2011-05-03 SURGICAL SUPPLY — 37 items
CHLORAPREP W/TINT 26ML (MISCELLANEOUS) ×2 IMPLANT
CLOTH BEACON ORANGE TIMEOUT ST (SAFETY) ×2 IMPLANT
CONTAINER PREFILL 10% NBF 15ML (MISCELLANEOUS) IMPLANT
DRAIN JACKSON PRT FLT 7MM (DRAIN) IMPLANT
DRESSING TELFA 8X3 (GAUZE/BANDAGES/DRESSINGS) ×2 IMPLANT
ELECT REM PT RETURN 9FT ADLT (ELECTROSURGICAL) ×2
ELECTRODE REM PT RTRN 9FT ADLT (ELECTROSURGICAL) ×1 IMPLANT
EVACUATOR SILICONE 100CC (DRAIN) IMPLANT
EXTRACTOR VACUUM M CUP 4 TUBE (SUCTIONS) IMPLANT
GAUZE SPONGE 4X4 12PLY STRL LF (GAUZE/BANDAGES/DRESSINGS) ×2 IMPLANT
GLOVE BIOGEL PI IND STRL 8.5 (GLOVE) ×1 IMPLANT
GLOVE BIOGEL PI INDICATOR 8.5 (GLOVE) ×1
GLOVE ECLIPSE 8.0 STRL XLNG CF (GLOVE) ×4 IMPLANT
GOWN PREVENTION PLUS LG XLONG (DISPOSABLE) ×4 IMPLANT
GOWN PREVENTION PLUS XXLARGE (GOWN DISPOSABLE) ×2 IMPLANT
KIT ABG SYR 3ML LUER SLIP (SYRINGE) ×2 IMPLANT
NEEDLE HYPO 25X1 1.5 SAFETY (NEEDLE) ×2 IMPLANT
NEEDLE HYPO 25X5/8 SAFETYGLIDE (NEEDLE) ×2 IMPLANT
PACK C SECTION WH (CUSTOM PROCEDURE TRAY) ×2 IMPLANT
PAD ABD 7.5X8 STRL (GAUZE/BANDAGES/DRESSINGS) ×2 IMPLANT
RINGERS IRRIG 1000ML POUR BTL (IV SOLUTION) ×2 IMPLANT
SLEEVE SCD COMPRESS KNEE LRG (MISCELLANEOUS) ×2 IMPLANT
STAPLER VISISTAT 35W (STAPLE) IMPLANT
SUT MNCRL AB 3-0 PS2 27 (SUTURE) IMPLANT
SUT PLAIN 0 NONE (SUTURE) IMPLANT
SUT SILK 3 0 FS 1X18 (SUTURE) IMPLANT
SUT VIC AB 0 CT1 27 (SUTURE) ×2
SUT VIC AB 0 CT1 27XBRD ANBCTR (SUTURE) ×2 IMPLANT
SUT VIC AB 2-0 CTX 36 (SUTURE) ×4 IMPLANT
SUT VIC AB 3-0 CT1 27 (SUTURE)
SUT VIC AB 3-0 CT1 TAPERPNT 27 (SUTURE) IMPLANT
SUT VIC AB 3-0 SH 27 (SUTURE)
SUT VIC AB 3-0 SH 27X BRD (SUTURE) IMPLANT
SYR CONTROL 10ML LL (SYRINGE) ×2 IMPLANT
TOWEL OR 17X24 6PK STRL BLUE (TOWEL DISPOSABLE) ×4 IMPLANT
TRAY FOLEY CATH 14FR (SET/KITS/TRAYS/PACK) ×2 IMPLANT
WATER STERILE IRR 1000ML POUR (IV SOLUTION) ×2 IMPLANT

## 2011-05-03 NOTE — Progress Notes (Addendum)
20 y.o. year old female,at [redacted]w[redacted]d gestation.  SUBJECTIVE:  The patient denies headaches, blurred vision, and right upper quadrant tenderness. Her nausea has improved significantly. She complains of swelling in her hands.  OBJECTIVE:  BP 161/77  Pulse 91  Temp(Src) 98.5 F (36.9 C) (Oral)  Resp 20  Ht 5\' 10"  (1.778 m)  Wt 177.13 kg (390 lb 8 oz)  BMI 56.03 kg/m2  SpO2 99%  LMP 09/15/2010  Blood pressures have been as high as 200/110 and the patient has required 70 mg of IV labetalol and 20 mg of IV hydralazine to bring her blood pressure to this level.  Fetal Heart Tones:  Category 1  Contractions:          Mild  Chest is clear  Heart regular rate and rhythm  Abdomen is gravid, and nontender  Extremities have 3+ edema  Neurologic exam is normal (no clonus)  Cervix is closed and long  24-hour urine protein from 05/02/2011 showed 230 mg of protein per 24 hours.  CBC    Component Value Date/Time   WBC 9.4 05/03/2011 0816   RBC 3.73* 05/03/2011 0816   HGB 11.4* 05/03/2011 0816   HCT 34.1* 05/03/2011 0816   PLT 242 05/03/2011 0816   MCV 91.4 05/03/2011 0816   MCH 30.6 05/03/2011 0816   MCHC 33.4 05/03/2011 0816   RDW 14.1 05/03/2011 0816   LYMPHSABS 1.8 04/24/2011 1309   MONOABS 0.5 04/24/2011 1309   EOSABS 0.1 04/24/2011 1309   BASOSABS 0.0 04/24/2011 1309    CMP     Component Value Date/Time   NA 135 05/03/2011 0816   K 4.5 05/03/2011 0816   CL 104 05/03/2011 0816   CO2 22 05/03/2011 0816   GLUCOSE 84 05/03/2011 0816   BUN 7 05/03/2011 0816   CREATININE 0.81 05/03/2011 0816   CREATININE 0.65 04/25/2011 1325   CALCIUM 9.6 05/03/2011 0816   PROT 5.8* 05/03/2011 0816   ALBUMIN 2.3* 05/03/2011 0816   AST 92* 05/03/2011 0816   ALT 49* 05/03/2011 0816   ALKPHOS 138* 05/03/2011 0816   BILITOT 0.2* 05/03/2011 0816   GFRNONAA >90 05/03/2011 0816   GFRAA >90 05/03/2011 0816     ASSESSMENT:  102w6d Weeks Pregnancy  Severe preeclampsia versus  pregnancy-induced hypertension  PLAN:  We will check a comprehensive metabolic panel and CBC. We will compare with the values from earlier today. If the liver enzymes continue to increase, then we must consider cesarean delivery. I will request a maternal fetal medicine consult. The patient will receive magnesium if it appears that we need to proceed with delivery. I do not think that we will give betamethasone if delivery is required today. If it appears that delivery may be delayed, then I will give betamethasone. I discussed severe preeclampsia with the patient, her mother, and her sister. We reviewed the natural history of the disease. The risk of cesarean section were outlined including, but not limited to, anesthetic complications, bleeding, infections, and possible damage to the surrounding organs.  Leonard Schwartz, M.D.

## 2011-05-03 NOTE — Consult Note (Signed)
Called to attend 32.[redacted] week gestation delivery by urgent C/S secondary to maternal severe preeclampsia. Notified earlier in day of impending delivery decision by Dr. Stefano Gaul. Informed that mother has received labetalol, magnesium sulfate and apresoline prior to delivery.   Mother did not receive BMZ.  At delivery infant in vertex presentation after AROM (bloody) with spontaneous cries prior to completion of exposure.  Sustained cries post delivery with active MAE.  Given tactile stim and bulb suction to naso/oropharynx.  Low tone maintained through 5 minutes of age to a minus one point but had recovered by arrival in NICU.  Transitional period was notable for one brief apnea after sustained crying but otherwise maintained steady respirations without increased work of breathing and pink mucus membranes in room air. Apgar 7/8 at one and five minutes. Infant has voided and stooled in OR>    Shown to mother briefly and then placed in transport isolette and transported to NICU in transport isolette. Care to [facility]>    Shea Kapur. Alphonsa Gin MD [facility]

## 2011-05-03 NOTE — Transfer of Care (Signed)
Immediate Anesthesia Transfer of Care Note  Patient: Andrea Morton  Procedure(s) Performed:  CESAREAN SECTION  Patient Location: PACU  Anesthesia Type: Spinal  Level of Consciousness: awake, alert  and oriented  Airway & Oxygen Therapy: Patient Spontanous Breathing  Post-op Assessment: Report given to PACU RN and Post -op Vital signs reviewed and stable  Post vital signs: Reviewed and stable  Complications: No apparent anesthesia complications

## 2011-05-03 NOTE — Progress Notes (Signed)
20 y.o. year old female,at [redacted]w[redacted]d gestation.  SUBJECTIVE:  The patient now complains of severe headaches.  OBJECTIVE:  BP 177/92  Pulse 88  Temp(Src) 98.5 F (36.9 C) (Oral)  Resp 20  Ht 5\' 10"  (1.778 m)  Wt 177.13 kg (390 lb 8 oz)  BMI 56.03 kg/m2  SpO2 100%  LMP 09/15/2010  Fetal Heart Tones:  Category 1  Contractions:          Mild  CBC    Component Value Date/Time   WBC 10.0 05/03/2011 1159   RBC 3.84* 05/03/2011 1159   HGB 11.8* 05/03/2011 1159   HCT 35.2* 05/03/2011 1159   PLT 259 05/03/2011 1159   MCV 91.7 05/03/2011 1159   MCH 30.7 05/03/2011 1159   MCHC 33.5 05/03/2011 1159   RDW 14.0 05/03/2011 1159   LYMPHSABS 1.8 04/24/2011 1309   MONOABS 0.5 04/24/2011 1309   EOSABS 0.1 04/24/2011 1309   BASOSABS 0.0 04/24/2011 1309    CMP     Component Value Date/Time   NA 136 05/03/2011 1159   K 3.7 05/03/2011 1159   CL 104 05/03/2011 1159   CO2 23 05/03/2011 1159   GLUCOSE 89 05/03/2011 1159   BUN 7 05/03/2011 1159   CREATININE 0.81 05/03/2011 1159   CREATININE 0.65 04/25/2011 1325   CALCIUM 9.8 05/03/2011 1159   PROT 5.8* 05/03/2011 1159   ALBUMIN 2.4* 05/03/2011 1159   AST 58* 05/03/2011 1159   ALT 45* 05/03/2011 1159   ALKPHOS 144* 05/03/2011 1159   BILITOT 0.2* 05/03/2011 1159   GFRNONAA >90 05/03/2011 1159   GFRAA >90 05/03/2011 1159     ASSESSMENT:  [redacted]w[redacted]d Weeks Pregnancy  Severe Preeclampsia  PLAN:  Cesarean section recommended by MFM and I agree.  Leonard Schwartz, M.D.

## 2011-05-03 NOTE — H&P (Signed)
Andrea Morton is a 20 y.o. morbidly obese black female presenting at 32.6 weeks unannounced for emesis episode x 1 around 0030, preceding by constant abdominal pain w/ onset shortly after she ate dinner at 2000, and headache unrelieved by Tylenol.  By time of arrival, symptoms had resolved spontaneously.  Reports GFM.  No VB, LOF, or abnl d/c.  Accompanied by her mother.  No other PIH s/s.  No UTI s/s.  No fever or chills.  In MAU last night and d/c'd home after nml 24hr urine result, but increased Labetalol to 200mg  po tid.  Reports compliance w/ dosing, however, last weekend, pt did not take med as prescribed.  Pt was hospitalized last week for decreased fetal movement and BP observation, and nurses had difficult time with patient taking monitors off independently, and felt she pulled out her IV b/c she didn't want it in.  Pt has been hospitalized previous to last week for BP observation and 24 hr urine collection.  Pt declines meds in MAU.  Despite emesis earlier, pt's mother went out and bought her "steak & shake."   Maternal Medical History:  Reason for admission: Reason for admission: nausea.  BP management, 24hr-urine collection, elevated AST  Contractions: Frequency: rare.   Perceived severity is mild.    Fetal activity: Perceived fetal activity is normal.    Prenatal complications: 1.  Morbidly obese w/ elevated HgA1c 2.  HSV hx 3.  Scabies in early pregnancy 4.  GBS positive in urine 5.  1st trimester trich 6.  2nd trimester spotting 7.  Smoking cessation w/ positive UPT 8.  CHTN vs. GHTN (suspect chronic) 9.  Poor dietary choices w/ sedentary lifestyle 10.  Noncompliance w/ meds and recommendations    OB History    Grav Para Term Preterm Abortions TAB SAB Ect Mult Living   1 0 0 0 0 0 0 0 0 0      Past Medical History  Diagnosis Date  . Herpes, genital   . Hypertension   . Pregnancy induced hypertension   . Obesity    Past Surgical History  Procedure Date  . No  past surgeries    Family History: family history includes Asthma in her sister; Diabetes in her maternal aunt, maternal grandmother, and mother; and Obesity in her maternal aunt, mother, and sister.  There is no history of Anesthesia problems. Social History:  reports that she quit smoking about 4 months ago. Her smoking use included Cigarettes. She quit after 2 years of use. She has never used smokeless tobacco. She reports that she does not drink alcohol or use illicit drugs.  Review of Systems  Constitutional: Negative.   HENT:       Intermittent, but last night, tried tylenol and unrelieved; not present on arrival  Eyes: Negative.   Respiratory: Negative.   Cardiovascular: Negative.   Gastrointestinal: Positive for nausea, vomiting and abdominal pain.  Genitourinary: Negative.   Neurological: Positive for headaches.   .. Results for orders placed during the hospital encounter of 05/03/11 (from the past 48 hour(s))  URINALYSIS, ROUTINE W REFLEX MICROSCOPIC     Status: Abnormal   Collection Time   05/03/11  1:50 AM      Component Value Range Comment   Color, Urine YELLOW  YELLOW     APPearance CLEAR  CLEAR     Specific Gravity, Urine 1.020  1.005 - 1.030     pH 6.5  5.0 - 8.0     Glucose, UA NEGATIVE  NEGATIVE (mg/dL)    Hgb urine dipstick NEGATIVE  NEGATIVE     Bilirubin Urine NEGATIVE  NEGATIVE     Ketones, ur NEGATIVE  NEGATIVE (mg/dL)    Protein, ur 30 (*) NEGATIVE (mg/dL)    Urobilinogen, UA 0.2  0.0 - 1.0 (mg/dL)    Nitrite NEGATIVE  NEGATIVE     Leukocytes, UA SMALL (*) NEGATIVE    URINE MICROSCOPIC-ADD ON     Status: Abnormal   Collection Time   05/03/11  1:50 AM      Component Value Range Comment   Squamous Epithelial / LPF FEW (*) RARE     WBC, UA 0-2  <3 (WBC/hpf)    Bacteria, UA RARE  RARE    CBC     Status: Abnormal   Collection Time   05/03/11  2:05 AM      Component Value Range Comment   WBC 11.6 (*) 4.0 - 10.5 (K/uL)    RBC 3.82 (*) 3.87 - 5.11 (MIL/uL)     Hemoglobin 11.7 (*) 12.0 - 15.0 (g/dL)    HCT 95.6 (*) 21.3 - 46.0 (%)    MCV 91.9  78.0 - 100.0 (fL)    MCH 30.6  26.0 - 34.0 (pg)    MCHC 33.3  30.0 - 36.0 (g/dL)    RDW 08.6  57.8 - 46.9 (%)    Platelets 228  150 - 400 (K/uL)   COMPREHENSIVE METABOLIC PANEL     Status: Abnormal   Collection Time   05/03/11  2:05 AM      Component Value Range Comment   Sodium 137  135 - 145 (mEq/L)    Potassium 3.9  3.5 - 5.1 (mEq/L)    Chloride 104  96 - 112 (mEq/L)    CO2 25  19 - 32 (mEq/L)    Glucose, Bld 122 (*) 70 - 99 (mg/dL)    BUN 7  6 - 23 (mg/dL)    Creatinine, Ser 6.29  0.50 - 1.10 (mg/dL)    Calcium 9.4  8.4 - 10.5 (mg/dL)    Total Protein 5.6 (*) 6.0 - 8.3 (g/dL)    Albumin 2.3 (*) 3.5 - 5.2 (g/dL)    AST 52 (*) 0 - 37 (U/L)    ALT 31  0 - 35 (U/L)    Alkaline Phosphatase 121 (*) 39 - 117 (U/L)    Total Bilirubin 0.3  0.3 - 1.2 (mg/dL)    GFR calc non Af Amer >90  >90 (mL/min)    GFR calc Af Amer >90  >90 (mL/min)   LACTATE DEHYDROGENASE     Status: Normal   Collection Time   05/03/11  2:05 AM      Component Value Range Comment   LD 185  94 - 250 (U/L)   URIC ACID     Status: Normal   Collection Time   05/03/11  2:05 AM      Component Value Range Comment   Uric Acid, Serum 6.1  2.4 - 7.0 (mg/dL)      Blood pressure 528/41, pulse 88, temperature 98.2 F (36.8 C), temperature source Oral, resp. rate 24, height 5\' 10"  (1.778 m), weight 177.13 kg (390 lb 8 oz), last menstrual period 09/15/2010, SpO2 99.00%. .. Filed Vitals:   05/03/11 0232 05/03/11 0236 05/03/11 0251 05/03/11 0306  BP: 144/78 146/50 125/60 152/91  Pulse:    88  Temp:    98.2 F (36.8 C)  TempSrc:    Oral  Resp:  24  Height:      Weight:      SpO2:   99%    Maternal Exam:  Uterine Assessment: Intermittent UI w/ occ'l mild ctxs  Abdomen: Patient reports no abdominal tenderness. Introitus: not evaluated.   Cervix: not evaluated.   Fetal Exam Fetal Monitor Review: Mode: ultrasound.     Baseline rate: 140.  Variability: moderate (6-25 bpm).   Pattern: accelerations present and no decelerations.    Fetal State Assessment: Category I - tracings are normal.     Physical Exam  Constitutional: She is oriented to person, place, and time. She appears well-developed and well-nourished. No distress.  Cardiovascular: Normal rate and regular rhythm.   Respiratory: Effort normal and breath sounds normal.  GI: Soft. Bowel sounds are normal.  Musculoskeletal: She exhibits no edema.  Neurological: She is alert and oriented to person, place, and time. She has normal reflexes.  Skin: Skin is warm and dry.  Psychiatric: Her behavior is normal.       Flat, but baseline.    Prenatal labs: ABO, Rh: A/POS/-- (06/19 1425) Antibody: NEG (06/19 1425) Rubella: 12.3 (06/19 1425) RPR: NON REAC (06/19 1425)  HBsAg: NEGATIVE (06/19 1425)  HIV: NON REACTIVE (06/19 1425)  GBS:   positive in urine  Assessment/Plan: 1.  IUP at 32.6 weeks 2.  Elevated AST 3.  30mg  protein on voided specimen yesterday & today (24 hr urine yest=126 total protein however) 4.  Continued systolic elevations despite Labetalol increase yesterday 5.  Self-limiting emesis episode around 0030; no HA in MAU 6.  H/o noncompliance w/ meds/recommendations 7.  Cat I FHT  1.  Per c/w dr. Su Hilt, admit for observation, repeat PIH labs at 1000, begin 24 hr urine collection 2.  Labetalol to be increased to 300mg  po tid this AM 3. Continuous monitoring per AR  (Note:  Pt called me back to MAU 2nd time to state she did not want to stay and wanted to know  Again why she was be admitted)  4.  MD to follow  Nycere Presley H 05/03/2011, 3:49 AM

## 2011-05-03 NOTE — Progress Notes (Signed)
Pt states she vomited 1 time at 0030 and now has constant stomach pain

## 2011-05-03 NOTE — Anesthesia Preprocedure Evaluation (Addendum)
Anesthesia Evaluation  Patient identified by MRN, date of birth, ID band Patient awake    Reviewed: Allergy & Precautions, H&P , Patient's Chart, lab work & pertinent test results  Airway Mallampati: IV TM Distance: <3 FB Neck ROM: full    Dental No notable dental hx.    Pulmonary neg pulmonary ROS,  clear to auscultation  Pulmonary exam normal       Cardiovascular hypertension, neg cardio ROS regular Normal    Neuro/Psych  Headaches, Negative Psych ROS   GI/Hepatic negative GI ROS, Neg liver ROS,   Endo/Other  Negative Endocrine ROSMorbid obesity  Renal/GU negative Renal ROS     Musculoskeletal   Abdominal   Peds  Hematology negative hematology ROS (+)   Anesthesia Other Findings Severe pre-eclampsia/ HELLP/ with new neurological symptoms  Reproductive/Obstetrics (+) Pregnancy                          Anesthesia Physical Anesthesia Plan  ASA: IV and Emergent  Anesthesia Plan: Spinal   Post-op Pain Management:    Induction:   Airway Management Planned:   Additional Equipment:   Intra-op Plan:   Post-operative Plan:   Informed Consent: I have reviewed the patients History and Physical, chart, labs and discussed the procedure including the risks, benefits and alternatives for the proposed anesthesia with the patient or authorized representative who has indicated his/her understanding and acceptance.     Plan Discussed with:   Anesthesia Plan Comments:         Anesthesia Quick Evaluation

## 2011-05-03 NOTE — Progress Notes (Signed)
Pt c/o abdominal cramping and vomited x 1 at 0030. Occasional RUQ pain. States has HA, minimal relief from Tylenol 650 mg po taken at 0100.

## 2011-05-03 NOTE — Anesthesia Postprocedure Evaluation (Signed)
Anesthesia Post Note  Patient: Andrea Morton  Procedure(s) Performed:  CESAREAN SECTION  Anesthesia type: Spinal  Patient location: PACU  Post pain: Pain level controlled  Post assessment: Post-op Vital signs reviewed  Last Vitals:  Filed Vitals:   05/03/11 1615  BP:   Pulse: 73  Temp:   Resp: 22    Post vital signs: Reviewed  Level of consciousness: awake  Complications: No apparent anesthesia complications

## 2011-05-03 NOTE — Progress Notes (Signed)
Pt angry, states "I don't want to stay in hospital". "Go call Hillary (CNM) and tell her". H Steelman CNM called and informed of above.

## 2011-05-03 NOTE — Anesthesia Procedure Notes (Signed)
Spinal  Patient location during procedure: OR Start time: 05/03/2011 2:27 PM Staffing Performed by: anesthesiologist  Preanesthetic Checklist Completed: patient identified, site marked, surgical consent, pre-op evaluation, timeout performed, IV checked, risks and benefits discussed and monitors and equipment checked Spinal Block Patient position: sitting Prep: DuraPrep Patient monitoring: heart rate, cardiac monitor, continuous pulse ox and blood pressure Approach: midline Location: L3-4 Injection technique: single-shot Needle Needle type: Sprotte and Tuohy  Needle gauge: 24 G Needle length: 12.7 cm Assessment Sensory level: T4 Additional Notes Patient identified.  Risk benefits discussed including failed block, incomplete pain control, headache, nerve damage, paralysis, blood pressure changes, nausea, vomiting, reactions to medication both toxic or allergic, and postpartum back pain.  Patient expressed understanding and wished to proceed.  All questions were answered.  Sterile technique used throughout procedure.  CSF was clear.  No parasthesia or other complications.  Please see nursing notes for vital signs.  tuohey used as Interior and spatial designer

## 2011-05-03 NOTE — Progress Notes (Signed)
Pt sitting in bedside chair. Argumentive and angry. Will not allow EFM at this time.

## 2011-05-03 NOTE — Op Note (Signed)
OPERATIVE NOTE  Patient's Name: Andrea Morton  Date of Birth: 1990/09/05  Medical Records Number: [id number]  Date of Operation: 05/03/2011  Preoperative diagnosis:  [redacted]w[redacted]d weeks gestation  Severe Pre-Eclampsia  Morbid obesity (390 pounds)  Postoperative diagnosis:  [redacted]w[redacted]d weeks gestation  Severe Pre-Eclampsia  Morbid obesity (390 pounds)  Procedure:  Primary CESAREAN SECTION  Surgeon:  Leonard Schwartz, M.D.  Assistant:  Sanda Klein, certified nurse midwife  Anesthesia:  Regional  Disposition:  Andrea Morton is a 20 y.o. female, gravida 1 para 0-0-0-0, who presents at [redacted]w[redacted]d weeks gestation. The patient has been followed at the [facility] division of [facility] health care for women. She has the above mentioned diagnosis. Her blood pressure management has required IV labetalol and IV hydralazine. Her liver enzymes are elevated. She understands the indications for her procedure and she accepts the risk of, but not limited to, anesthetic complications, bleeding, infections, and possible damage to the surrounding organs.  Findings:  A 5 pound 3 ounce female [facility]) was delivered from a OT position.  The Apgar scores were 7/8 . The uterus, fallopian tubes, and ovaries were normal for the gravid state. The arterial cord blood pH was 7.08.  Procedure:  The patient was taken to the operating room where a spinal anesthetic was given. The patient's abdomen was prepped with Chloraprep. The perineum was prepped with betadine. A Foley catheter was placed in the bladder. The patient was sterilely draped. The lower abdomen was injected with half percent Marcaine with epinephrine. A low transverse incision was made in the abdomen and carried sharply through the subcutaneous tissue, the fascia, and the anterior peritoneum. An incision was made in the lower uterine segment. The incision was extended in a low transverse fashion. The  membranes were ruptured. The fetal head was delivered without difficulty. The mouth and nose were suctioned. The remainder of the infant was then delivered. The cord was clamped and cut. The infant was handed to the awaiting pediatric team. The placenta was removed. The uterine cavity was cleaned of amniotic fluid, clotted blood, and membranes. The uterine incision was closed using a running locking suture of 2-0 Vicryl. An imbricating suture of 2-0 Vicryl was placed. The pelvis was vigorously irrigated. Hemostasis was adequate. The anterior peritoneum and the abdominal musculature were closed using 2-0 Vicryl. The fascia was closed using a running suture of 0 Vicryl followed by 3 interrupted sutures of 0 Vicryl. The subcutaneous layer was closed using interrupted sutures of 2-0 Vicryl. The skin was reapproximated using a subcuticular suture of 3-0 Monocryl. Sponge, needle, and instrument counts were correct on 2 occasions. The estimated blood loss for the procedure was 400 cc. The patient tolerated her procedure well. She was transported to the recovery room in stable condition. The infant was taken to the NICU in stable condition. The placenta was sent to pathology.  Leonard Schwartz, M.D.

## 2011-05-03 NOTE — Progress Notes (Signed)
MFM Consultation Note  Ms. Comas is a 20 year old G1 obese AA at 32+6 weeks who was admitted this AM for elevated BPs. BPs were normal at the beginning of the pregnancy but Labetalol needed to be started at 24 weeks. Her pressures have slowly increased since then despite increase doses of Labetalol. This AM her BPs were in the severe preeclamptic range (170s-200s/80s-117) which required a total 70 mg of IV Labetalol and 20 mg of Apresoline. Her HELLP labs revealed elevated LFTs with normal platelets and LDH. 24 hour urine is pending but prior collections have been in the normal range.  Currently, she c/o a headache and "seeing stars". No abdominal pain today but did have some RUQ pain last PM.  Assessment:  1) IUP at 32+6 weeks 2) Severe preeclampsia based on pressures; on magnesium 3) ? Early HELLP - most recent labs showed improved LFTs 4) Mobidly obese  Recommendation: Given the severity of her hypertension, I feel that delivery is warranted at this time.  Thank you for the kind referral.  (Face-to-face consultation with patient: 30 min)

## 2011-05-04 LAB — COMPREHENSIVE METABOLIC PANEL
Albumin: 2.2 g/dL — ABNORMAL LOW (ref 3.5–5.2)
BUN: 8 mg/dL (ref 6–23)
Calcium: 8.9 mg/dL (ref 8.4–10.5)
Chloride: 103 mEq/L (ref 96–112)
Creatinine, Ser: 0.86 mg/dL (ref 0.50–1.10)
GFR calc non Af Amer: >90 mL/min (ref >90)
Total Bilirubin: 0.1 mg/dL — ABNORMAL LOW (ref 0.3–1.2)

## 2011-05-04 LAB — RPR: RPR Ser Ql: NONREACTIVE

## 2011-05-04 LAB — CBC
MCH: 30.8 pg (ref 26.0–34.0)
MCHC: 33 g/dL (ref 30.0–36.0)
MCV: 93.6 fL (ref 78.0–100.0)
Platelets: 219 10*3/uL (ref 150–400)
RBC: 3.73 MIL/uL — ABNORMAL LOW (ref 3.87–5.11)

## 2011-05-04 MED ORDER — MAGNESIUM SULFATE 40 G IN LACTATED RINGERS - SIMPLE
2.0000 g/h | INTRAVENOUS | Status: DC
Start: 2011-05-04 — End: 2011-05-05

## 2011-05-04 MED ORDER — METOCLOPRAMIDE HCL 5 MG/ML IJ SOLN: 10.0000 mg | Freq: Three times a day (TID) | INTRAMUSCULAR | Status: DC | PRN

## 2011-05-04 NOTE — Anesthesia Postprocedure Evaluation (Signed)
Anesthesia Post Note  Patient: Andrea Morton  Procedure(s) Performed:  CESAREAN SECTION  Anesthesia type: SAB  Patient location: Mother/Baby  Post pain: Pain level controlled  Post assessment: Post-op Vital signs reviewed  Last Vitals:  Filed Vitals:   05/04/11 0924  BP: 151/93  Pulse: 90  Temp:   Resp: 20    Post vital signs: Reviewed  Level of consciousness: awake  Complications: No apparent anesthesia complications

## 2011-05-04 NOTE — Progress Notes (Signed)
Subjective: Postpartum Day 1: Cesarean Delivery Patient reports tolerating PO.    Objective: Vital signs in last 24 hours: Temp:  [97.4 F (36.3 C)-98.2 F (36.8 C)] 97.6 F (36.4 C) (11/30 1200) Pulse Rate:  [64-99] 99  (11/30 1420) Resp:  [18-24] 18  (11/30 1420) BP: (132-183)/(57-101) 151/85 mmHg (11/30 1420) SpO2:  [93 %-100 %] 99 % (11/30 1420) Weight:  [173.637 kg (382 lb 12.8 oz)] 382 lb 12.8 oz (173.637 kg) (11/30 1610)  Physical Exam:  General: alert, cooperative, no distress and morbidly obese Lochia: appropriate Uterine Fundus: Involuted Incision: Dressing dry DVT Evaluation: No evidence of DVT seen on physical exam.   Basename 05/04/11 0555 05/03/11 1159  HGB 11.5* 11.8*  HCT 34.9* 35.2*   SGOT, SGPT nl Assessment/Plan: Status post Cesarean section. Postoperative course complicated by resolving pre eclampsia with 4 kg wt loss, but continued elevations in BP Continue current care with Magnesium sulfate through the night. Continue labetolol for now, but if bp control not improved by am, will dc labatolol and start Procardia XL after dcing magnesium.   Andrea Morton P 05/04/2011, 2:56 PM

## 2011-05-04 NOTE — Addendum Note (Signed)
Addendum  created 05/04/11 1019 by Doreene Burke   Modules edited:Notes Section

## 2011-05-04 NOTE — Progress Notes (Signed)
UR chart review completed.  

## 2011-05-05 DIAGNOSIS — O1410 Severe pre-eclampsia, unspecified trimester: Secondary | ICD-10-CM | POA: Diagnosis present

## 2011-05-05 MED ORDER — OXYCODONE-ACETAMINOPHEN 5-325 MG PO TABS
1.0000 | ORAL_TABLET | ORAL | Status: DC | PRN
  Administered 2011-05-05 – 2011-05-06 (×5): 1 via ORAL
  Filled 2011-05-05 (×5): qty 1

## 2011-05-05 MED ORDER — FUROSEMIDE 10 MG/ML IJ SOLN
20.0000 mg | Freq: Once | INTRAMUSCULAR | Status: AC
Start: 2011-05-05 — End: 2011-05-05
  Administered 2011-05-05: 20 mg via INTRAVENOUS
  Filled 2011-05-05 (×2): qty 2

## 2011-05-05 MED ORDER — HYDROCHLOROTHIAZIDE 25 MG PO TABS
25.0000 mg | ORAL_TABLET | Freq: Every day | ORAL | Status: DC
  Administered 2011-05-05 – 2011-05-06 (×2): 25 mg via ORAL
  Filled 2011-05-05 (×3): qty 1

## 2011-05-05 NOTE — Progress Notes (Signed)
LCSW attempted visit with MOB following baby's NICU admission.  MOB, maternal grandma, and maternal great grandma were all on their way to the NICU.  I briefly discussed purpose of my visit and family agreed to meet at another time to discuss NICU supports and services.   Seaborn Nakama, LCSW, 05/05/2011, 4:11 pm

## 2011-05-05 NOTE — Progress Notes (Signed)
Subjective: Postpartum Day 2 Cesarean Delivery Patient reports tolerating PO, + flatus, + BM and no problems voiding.    Objective: Vital signs in last 24 hours: Temp:  [97.9 F (36.6 C)-98.5 F (36.9 C)] 98.5 F (36.9 C) (12/01 0800) Pulse Rate:  [83-99] 92  (12/01 1045) Resp:  [18-22] 22  (12/01 0800) BP: (114-152)/(55-98) 142/86 mmHg (12/01 1045) SpO2:  [97 %-100 %] 98 % (12/01 1045) Weight:  [176.54 kg (389 lb 3.2 oz)] 389 lb 3.2 oz (176.54 kg) (12/01 0600)  Physical Exam:  General: alert, cooperative and moderately obese Lochia: appropriate Uterine Fundus: firm Incision: healing well DVT Evaluation: No evidence of DVT seen on physical exam.   Basename 05/04/11 0555 05/03/11 1159  HGB 11.5* 11.8*  HCT 34.9* 35.2*    Assessment/Plan: Status post Cesarean section for severe pre-eclampsia. Doing well postoperatively.  No additional weight loss overnight.  Will give IV lasix and start HCTZ qd for discharge Magnesium discontinued Continue labetalol 3 times daily Observe blood pressure closely for control and possible need for change in antihypertensive Discharge from AICU.  Yzabelle Calles P 05/05/2011, 12:02 PM

## 2011-05-05 NOTE — Progress Notes (Signed)
Patient ID: Andrea Morton, female   DOB: 11-Dec-1990, 20 y.o.   MRN: [id number] Subjective: Postpartum Day 2: Cesarean Delivery Patient reports incisional pain, tolerating PO, + flatus and no problems voiding.   no complaints, up ad lib without syncope Pain well controlled with po meds, c/o pain when up ambulating  Mood stable, bonding well, baby in NICU  mag sulfate is dc'd now  Objective: Vital signs in last 24 hours: Temp:  [97.6 F (36.4 C)-98.5 F (36.9 C)] 98.5 F (36.9 C) (12/01 0800) Pulse Rate:  [83-99] 86  (12/01 0800) Resp:  [18-22] 22  (12/01 0800) BP: (114-152)/(55-98) 130/84 mmHg (12/01 0800) SpO2:  [97 %-100 %] 100 % (12/01 0800) Weight:  [176.54 kg (389 lb 3.2 oz)] 389 lb 3.2 oz (176.54 kg) (12/01 0600)   Physical Exam:  General: alert and no distress eating breakfast Did not complete full assessment, pt declined at this time   [facility] 05/04/11 0555 05/03/11 1159  HGB 11.5* 11.8*  HCT 34.9* 35.2*    Assessment/Plan: Status post Cesarean section. Doing well postoperatively.  BP is stable on labetalol  Continue current care Dr Pennie Rushing to follow.  Parthiv Mucci M 05/05/2011, 8:59 AM

## 2011-05-06 MED ORDER — MEDROXYPROGESTERONE ACETATE 150 MG/ML IM SUSP: 150.0000 mg | INTRAMUSCULAR | Status: DC

## 2011-05-06 MED ORDER — OXYCODONE-ACETAMINOPHEN 5-325 MG PO TABS: 1.0000 | ORAL_TABLET | ORAL | Status: AC | PRN

## 2011-05-06 MED ORDER — HYDROCHLOROTHIAZIDE 25 MG PO TABS: 25.0000 mg | ORAL_TABLET | Freq: Every day | ORAL | Status: DC

## 2011-05-06 MED ORDER — LABETALOL HCL 300 MG PO TABS: 300.0000 mg | ORAL_TABLET | Freq: Three times a day (TID) | ORAL | Status: DC

## 2011-05-06 MED ORDER — IBUPROFEN 600 MG PO TABS: 600.0000 mg | ORAL_TABLET | Freq: Four times a day (QID) | ORAL | Status: AC

## 2011-05-06 NOTE — Progress Notes (Signed)
LCSW attempted again to meet with MOB but she was in the NICU during the day.  MOB was leaving due to being discharged.  I provided her with NICU brochure and encouraged follow up with social work staff as needed.   Staci Acosta, LCSW, 05/06/11, 3:23 pm

## 2011-05-06 NOTE — Progress Notes (Signed)
.  [facility] Subjective: Postpartum Day 3: Cesarean Delivery Patient reports no problems voiding.  Good pain relief  Objective: Vital signs in last 24 hours: Temp:  [97.6 F (36.4 C)-98.5 F (36.9 C)] 97.6 F (36.4 C) (12/02 0616) Pulse Rate:  [79-92] 83  (12/02 0616) Resp:  [18-20] 20  (12/02 0616) BP: (137-158)/(78-98) 139/88 mmHg (12/02 0616) SpO2:  [96 %-100 %] 96 % (12/02 0616) Weight:  [175.56 kg (387 lb 0.6 oz)] 387 lb 0.6 oz (175.56 kg) (12/02 0500) 2 lb wt loss from 05/05/11  Physical Exam:  General: alert, cooperative and morbidly obese Lochia: appropriate Uterine Fundus: firm Incision: healing well DVT Evaluation: No evidence of DVT seen on physical exam.   Basename 05/04/11 0555 05/03/11 1159  HGB 11.5* 11.8*  HCT 34.9* 35.2*    Assessment/Plan: Status post Cesarean section. Postoperative course complicated by severe preeclampsia  D/c/ home.  Rhyen Mazariego P 05/06/2011, 10:10 AM

## 2011-05-06 NOTE — Discharge Summary (Signed)
Obstetric Discharge Summary Reason for Admission: severe pre-eclampsia Prenatal Procedures: NST, Preeclampsia workup and labs Intrapartum Procedures: cesarean: low cervical, transverse Postpartum Procedures: Hypertensive management Complications-Operative and Postpartum: none Hemoglobin  Date Value Range Status  05/04/2011 11.5* 12.0-15.0 (g/dL) Final     HCT  Date Value Range Status  05/04/2011 34.9* 36.0-46.0 (%) Final  HOSPITAL COURSE: The patient was admitted to the hospital and after undergoing laboratory studies labs found to have severe preeclampsia. She underwent a primarY transverse cesarean section. She did well postoperatively with rapid return of gastrointestinal function. She remained on magnesium sulfate for 36 hours postpartum. After discontinuation of the magnesium sulfate her diurese is decreased and she was placed on hydrochlorothiazide after repeat feeding a single dose of IV Lasix. Her blood pressures were well controlled on a regimen of labetalol 300 milligrams every 8 hours. On postpartum day 3 the patient had stable blood pressures with tolerating a regular diet, and was deemed to be ready for discharge home. She will have a Smart start nurse evaluate her blood pressure in 3-4 days and will return to [facility] for her 6 week postpartum evaluation.  Discharge Diagnoses: Preelampsia [redacted] weeks gestation, severe                                         Morbid obesity                                         Post partum hypertension question chronic  Discharge Information: Date: 05/06/2011 Activity: pelvic rest Diet: routine and No added salt Medications: As listed in medication rec Condition: improved Instructions: refer to practice specific booklet Discharge to: home   Newborn Data: Live born female  Birth Weight: 5 lb 1.8 oz (2320 g) APGAR: 7, 8  Remains in neonatal intensive care unit.  Andrea Morton 05/06/2011, 9:59 AM

## 2011-05-07 ENCOUNTER — Encounter (HOSPITAL_COMMUNITY): Payer: Self-pay | Admitting: Obstetrics and Gynecology

## 2011-05-14 ENCOUNTER — Ambulatory Visit (HOSPITAL_COMMUNITY): Payer: Medicaid Other

## 2011-06-11 NOTE — Progress Notes (Signed)
Post discharge review completed for dates of service on 04/23/11-04/25/11.

## 2011-06-21 ENCOUNTER — Other Ambulatory Visit: Payer: Self-pay | Admitting: Family Medicine

## 2011-06-21 MED ORDER — VALACYCLOVIR HCL 500 MG PO TABS: 500.0000 mg | ORAL_TABLET | Freq: Two times a day (BID) | ORAL | Status: DC | PRN

## 2011-07-02 ENCOUNTER — Encounter: Payer: Self-pay | Admitting: Family Medicine

## 2011-07-02 ENCOUNTER — Ambulatory Visit (INDEPENDENT_AMBULATORY_CARE_PROVIDER_SITE_OTHER): Payer: Medicaid Other | Admitting: Family Medicine

## 2011-07-02 DIAGNOSIS — R109 Unspecified abdominal pain: Secondary | ICD-10-CM

## 2011-07-02 MED ORDER — RANITIDINE HCL 150 MG PO TABS: 150.0000 mg | ORAL_TABLET | Freq: Two times a day (BID) | ORAL | Status: DC

## 2011-07-02 NOTE — Progress Notes (Signed)
  Subjective:    Patient ID: Andrea Morton, female    DOB: September 25, 1990, 21 y.o.   MRN: [id number]  HPI Abdominal pain: Patient reports epigastric pain x3 days. Off and on. Located in epigastric area. Positive nausea. Sometimes feels sharp. Occasional burning. Movement, food, even avoiding food all seems to make pain worse. Nothing seems to make pain better. Has taken Percocet but she had left over from postpartum period, has taken Pepto-Bismol and Tylenol. Nothing improved pain. Has never been diagnosed with reflux. Has not had any dark stools. No black stools. Has had normal bowel movements. No vomiting. Pain resolved yesterday for a while and ate at [employer] and the pain returned. Today pain has been pretty constant. Eating only small amounts of food. Had this similar symptoms during her pregnancy. Was never given diagnosis. No known sick contacts.  Review of Systems As per above.    Objective:   Physical Exam  Constitutional: She appears well-developed.       obese  HENT:  Head: Normocephalic and atraumatic.  Mouth/Throat: Oropharynx is clear and moist.  Eyes: Conjunctivae are normal.  Cardiovascular: Normal rate, regular rhythm and normal heart sounds.   No murmur heard. Pulmonary/Chest: Effort normal. No respiratory distress. She has no wheezes. She has no rales.  Abdominal: Soft. She exhibits no distension and no mass. There is tenderness (epigastric area). There is no rebound and no guarding.  Musculoskeletal: She exhibits no edema.  Neurological: She is alert.  Skin: No rash noted.  Psychiatric: She has a normal mood and affect.          Assessment & Plan:  The is and and will and and is in 1

## 2011-07-02 NOTE — Patient Instructions (Addendum)
Reflux: Ranitidine 2 x day  tums as needed.  Labs: I will either mail them or call you with results.  Please return within 1 week for recheck.   If any new or worsening symptoms call immediately for recheck or go to ER.

## 2011-07-03 ENCOUNTER — Encounter: Payer: Self-pay | Admitting: Family Medicine

## 2011-07-03 ENCOUNTER — Inpatient Hospital Stay (HOSPITAL_COMMUNITY): Payer: Medicaid Other

## 2011-07-03 ENCOUNTER — Telehealth: Payer: Self-pay | Admitting: Family Medicine

## 2011-07-03 ENCOUNTER — Ambulatory Visit (INDEPENDENT_AMBULATORY_CARE_PROVIDER_SITE_OTHER): Payer: Medicaid Other | Admitting: Family Medicine

## 2011-07-03 ENCOUNTER — Inpatient Hospital Stay (HOSPITAL_COMMUNITY)
Admission: AD | Admit: 2011-07-03 | Discharge: 2011-07-06 | DRG: 419 | Disposition: A | Discharge status: Home / Self Care | Payer: Medicaid Other | Source: Ambulatory Visit | Attending: Family Medicine | Admitting: Family Medicine

## 2011-07-03 DIAGNOSIS — E8881 Metabolic syndrome: Secondary | ICD-10-CM | POA: Diagnosis present

## 2011-07-03 DIAGNOSIS — F17200 Nicotine dependence, unspecified, uncomplicated: Secondary | ICD-10-CM | POA: Diagnosis present

## 2011-07-03 DIAGNOSIS — I10 Essential (primary) hypertension: Secondary | ICD-10-CM | POA: Diagnosis present

## 2011-07-03 DIAGNOSIS — K851 Biliary acute pancreatitis without necrosis or infection: Secondary | ICD-10-CM

## 2011-07-03 DIAGNOSIS — R932 Abnormal findings on diagnostic imaging of liver and biliary tract: Secondary | ICD-10-CM

## 2011-07-03 DIAGNOSIS — K859 Acute pancreatitis without necrosis or infection, unspecified: Secondary | ICD-10-CM

## 2011-07-03 DIAGNOSIS — A6000 Herpesviral infection of urogenital system, unspecified: Secondary | ICD-10-CM | POA: Diagnosis present

## 2011-07-03 DIAGNOSIS — E785 Hyperlipidemia, unspecified: Secondary | ICD-10-CM | POA: Diagnosis present

## 2011-07-03 DIAGNOSIS — K8050 Calculus of bile duct without cholangitis or cholecystitis without obstruction: Secondary | ICD-10-CM | POA: Diagnosis present

## 2011-07-03 DIAGNOSIS — R109 Unspecified abdominal pain: Secondary | ICD-10-CM | POA: Insufficient documentation

## 2011-07-03 DIAGNOSIS — K810 Acute cholecystitis: Secondary | ICD-10-CM

## 2011-07-03 HISTORY — DX: Severe pre-eclampsia, unspecified trimester: O14.10

## 2011-07-03 LAB — CBC
Platelets: 425 10*3/uL — ABNORMAL HIGH (ref 150–400)
RBC: 4.34 MIL/uL (ref 3.87–5.11)
WBC: 17.3 10*3/uL — ABNORMAL HIGH (ref 4.0–10.5)

## 2011-07-03 LAB — COMPREHENSIVE METABOLIC PANEL
ALT: 76 U/L — ABNORMAL HIGH (ref 0–35)
CO2: 22 mEq/L (ref 19–32)
Calcium: 9.7 mg/dL (ref 8.4–10.5)
Chloride: 107 mEq/L (ref 96–112)
Sodium: 142 mEq/L (ref 135–145)
Total Protein: 7.2 g/dL (ref 6.0–8.3)

## 2011-07-03 LAB — LIPID PANEL
LDL Cholesterol: 152 mg/dL — ABNORMAL HIGH (ref 0–99)
VLDL: 20 mg/dL (ref 0–40)

## 2011-07-03 LAB — LIPASE: Lipase: >2000 U/L — ABNORMAL HIGH (ref 0–75)

## 2011-07-03 MED ORDER — MORPHINE SULFATE 4 MG/ML IJ SOLN
4.0000 mg | INTRAMUSCULAR | Status: DC | PRN
Start: 2011-07-03 — End: 2011-07-06
  Administered 2011-07-05 – 2011-07-06 (×4): 4 mg via INTRAMUSCULAR
  Filled 2011-07-03 (×6): qty 1

## 2011-07-03 MED ORDER — MORPHINE SULFATE 2 MG/ML IJ SOLN
2.0000 mg | INTRAMUSCULAR | Status: DC | PRN
  Administered 2011-07-03: 2 mg via INTRAVENOUS
  Filled 2011-07-03: qty 1

## 2011-07-03 MED ORDER — MORPHINE SULFATE 4 MG/ML IJ SOLN: 4.0000 mg | INTRAMUSCULAR | Status: DC | PRN

## 2011-07-03 MED ORDER — MORPHINE SULFATE 4 MG/ML IJ SOLN
4.0000 mg | INTRAMUSCULAR | Status: DC | PRN
  Administered 2011-07-03 – 2011-07-05 (×8): 4 mg via INTRAVENOUS
  Filled 2011-07-03 (×7): qty 1

## 2011-07-03 MED ORDER — ONDANSETRON HCL 4 MG PO TABS: 4.0000 mg | ORAL_TABLET | Freq: Four times a day (QID) | ORAL | Status: DC | PRN

## 2011-07-03 MED ORDER — PANTOPRAZOLE SODIUM 40 MG IV SOLR
40.0000 mg | Freq: Every day | INTRAVENOUS | Status: DC
  Administered 2011-07-03 – 2011-07-05 (×3): 40 mg via INTRAVENOUS
  Filled 2011-07-03 (×4): qty 40

## 2011-07-03 MED ORDER — HYDRALAZINE HCL 20 MG/ML IJ SOLN
20.0000 mg | Freq: Four times a day (QID) | INTRAMUSCULAR | Status: DC | PRN
  Filled 2011-07-03: qty 1

## 2011-07-03 MED ORDER — HEPARIN SODIUM (PORCINE) 5000 UNIT/ML IJ SOLN
5000.0000 [IU] | Freq: Three times a day (TID) | INTRAMUSCULAR | Status: DC
  Filled 2011-07-03 (×3): qty 1

## 2011-07-03 MED ORDER — ENOXAPARIN SODIUM 40 MG/0.4ML ~~LOC~~ SOLN
40.0000 mg | SUBCUTANEOUS | Status: DC
  Filled 2011-07-03 (×2): qty 0.4

## 2011-07-03 MED ORDER — ONDANSETRON HCL 4 MG/2ML IJ SOLN: 4.0000 mg | Freq: Four times a day (QID) | INTRAMUSCULAR | Status: DC | PRN

## 2011-07-03 MED ORDER — SODIUM CHLORIDE 0.9 % IV SOLN
INTRAVENOUS | Status: DC
  Administered 2011-07-03 – 2011-07-04 (×4): via INTRAVENOUS

## 2011-07-03 NOTE — Progress Notes (Signed)
  Subjective:    Patient ID: Andrea Morton, female    DOB: [birth year], 21 y.o.   MRN: [id number]  HPI  Pt here for f/up-- abdominal pain.  Admitted for abdominal pain and possible pancreatitis.  See H and P.   Review of Systems     Objective:   Physical Exam        Assessment & Plan:

## 2011-07-03 NOTE — H&P (Signed)
Andrea Morton is an 21 y.o. female.   Chief Complaint: 3 day history of epigastric abdominal pain HPI:  Patient is a 21 y/o morbidly obese aaf s/p premature c-section (32weeks) 2 months ago. She has three days of epigastric pain 9/10 and loss of appetite, n/v. No bowel movements for two days. No fevers. Does have one episode of SOB yesterday that has resolved. She has no history of this kind of pain before. No alcohol consumption. No history of biliary disease known.    Past Medical History  Diagnosis Date  . Herpes, genital   . Hypertension   . Pregnancy induced hypertension   . Obesity     Past Surgical History  Procedure Date  . No past surgeries   . Cesarean section 05/03/2011    Procedure: CESAREAN SECTION;  Surgeon: Janine Limbo, MD;  Location: [facility];  Service: Gynecology;  Laterality: N/A;    Family History  Problem Relation Age of Onset  . Diabetes Mother   . Obesity Mother   . Asthma Sister   . Obesity Sister   . Diabetes Maternal Aunt   . Obesity Maternal Aunt   . Diabetes Maternal Grandmother   . Anesthesia problems Neg Hx    Social History:  reports that she quit smoking about 8 months ago. Her smoking use included Cigarettes. She quit after 2 years of use. She has never used smokeless tobacco. She reports that she does not drink alcohol or use illicit drugs.  Allergies: No Known Allergies  Medications Prior to Admission  Medication Dose Route Frequency Provider Last Rate Last Dose  . 0.9 %  sodium chloride infusion   Intravenous Continuous Edd Arbour, MD 200 mL/hr at 07/03/11 1419    . enoxaparin (LOVENOX) injection 40 mg  40 mg Subcutaneous Q24H Edd Arbour, MD      . hydrALAZINE (APRESOLINE) injection 20 mg  20 mg Intravenous Q6H PRN Edd Arbour, MD      . morphine 4 MG/ML injection 4 mg  4 mg Intravenous Q2H PRN Edd Arbour, MD      . ondansetron [facility]) tablet 4 mg  4 mg Oral Q6H PRN Ellin Mayhew, MD       Or  . ondansetron  (ZOFRAN) injection 4 mg  4 mg Intravenous Q6H PRN Ellin Mayhew, MD      . pantoprazole (PROTONIX) injection 40 mg  40 mg Intravenous QHS Ellin Mayhew, MD      . DISCONTD: heparin injection 5,000 Units  5,000 Units Subcutaneous Q8H Ellin Mayhew, MD      . DISCONTD: morphine 2 MG/ML injection 2 mg  2 mg Intravenous Q2H PRN Ellin Mayhew, MD   2 mg at 07/03/11 1419   Medications Prior to Admission  Medication Sig Dispense Refill  . ranitidine (ZANTAC) 150 MG tablet Take 1 tablet (150 mg total) by mouth 2 (two) times daily.  60 tablet  1  . valACYclovir (VALTREX) 500 MG tablet Take 500 mg by mouth 2 (two) times daily as needed. for outbreaks      . medroxyPROGESTERone (DEPO-PROVERA) 150 MG/ML injection Inject 1 mL (150 mg total) into the muscle every 3 (three) months.  1 mL  3    Results for orders placed in visit on 07/02/11 (from the past 48 hour(s))  CBC     Status: Abnormal   Collection Time   07/02/11  3:13 PM      Component Value Range Comment   WBC 17.3 (*) 4.0 - 10.5 (  K/uL)    RBC 4.34  3.87 - 5.11 (MIL/uL)    Hemoglobin 12.8  12.0 - 15.0 (g/dL)    HCT 16.1  09.6 - 04.5 (%)    MCV 92.9  78.0 - 100.0 (fL)    MCH 29.5  26.0 - 34.0 (pg)    MCHC 31.8  30.0 - 36.0 (g/dL)    RDW 40.9  81.1 - 91.4 (%)    Platelets 425 (*) 150 - 400 (K/uL)   COMPREHENSIVE METABOLIC PANEL     Status: Abnormal   Collection Time   07/02/11  3:13 PM      Component Value Range Comment   Sodium 142  135 - 145 (mEq/L)    Potassium 4.0  3.5 - 5.3 (mEq/L)    Chloride 107  96 - 112 (mEq/L)    CO2 22  19 - 32 (mEq/L)    Glucose, Bld 105 (*) 70 - 99 (mg/dL)    BUN 9  6 - 23 (mg/dL)    Creat 7.82  9.56 - 1.10 (mg/dL)    Total Bilirubin 2.0 (*) 0.3 - 1.2 (mg/dL)    Alkaline Phosphatase 128 (*) 39 - 117 (U/L)    AST 111 (*) 0 - 37 (U/L)    ALT 76 (*) 0 - 35 (U/L)    Total Protein 7.2  6.0 - 8.3 (g/dL)    Albumin 4.3  3.5 - 5.2 (g/dL)    Calcium 9.7  8.4 - 10.5 (mg/dL)   LIPASE     Status: Abnormal    Collection Time   07/02/11  3:13 PM      Component Value Range Comment   Lipase >2000 (*) 0 - 75 (U/L)    US Abdomen Complete  07/03/2011  *RADIOLOGY REPORT*  Clinical Data:  Pancreatitis.  COMPLETE ABDOMINAL ULTRASOUND 07/03/2011:  Comparison:  None.  Findings:  Gallbladder:  Numerous small shadowing gallstones, the largest on the order of approximately 10 mm.  No gallbladder wall thickening or pericholecystic fluid.  Negative sonographic Murphy's sign according to the ultrasound technologist.  Common bile duct:  Upper normal in caliber to perhaps minimally dilated with maximum diameter approximating 8 mm.  No visible bile duct stones.  Liver:  Normal size and echotexture without focal parenchymal abnormality.  Patent portal vein with hepatopetal flow.  IVC:  Patent.  Pancreas:  Although the pancreas is difficult to visualize in its entirety, no focal pancreatic abnormality is identified.  The tail was obscured by overlying bowel gas.  Spleen:  Normal size and echotexture without focal parenchymal abnormality.  Right Kidney:  No hydronephrosis.  Well-preserved cortex.  No shadowing calculi.  Normal size and parenchymal echotexture without focal abnormalities.  Approximately 13.6 cm in length.  Left Kidney:  No hydronephrosis.  Well-preserved cortex.  No shadowing calculi.  Normal size and parenchymal echotexture without focal abnormalities.  Approximately 13.3 cm in length.  Abdominal aorta:  Normal in caliber in its proximal and mid portions; obscured distally by overlying bowel gas.  IMPRESSION:  1.  Cholelithiasis without sonographic evidence of acute cholecystitis. 2.  Upper normal caliber to slightly dilated common bile duct measuring 8 mm.  No visible bile duct stones. 3.  Otherwise normal examination with a caveat that the pancreatic tail and the distal abdominal aorta were obscured by overlying bowel gas and were therefore not evaluated.  Original Report Authenticated By: Arnell Sieving, M.D.     ROS Pertinent items are noted in HPI. No fever, chills, night sweats, weight  loss.  AVSS  Physical Exam  Constitutional: AAF, supine, morbidly obese, No acute distress, asking for food. Lungs:  Normal respiratory effort, chest expands symmetrically. Lungs are clear to auscultation, no crackles or wheezes. Skin:  Intact without suspicious lesions or rashes Abd: soft, tender epigastrium, no rebound, no rigidity, voluntary guarding, normal bowel sounds, pfannenstiel scar. Heart - Regular rate and rhythm.  No murmurs, gallops or rubs.    Extremities:  No cyanosis, edema, or deformity noted  Mouth: mmm, dentition intact.  Neuro: grossly intact, aox3  Assessment/Plan 21 y/o aaf with acute pancreatitis, most likely gallstone pancreatitis from passed stone.   1. Gallstone Pancreatitis Elevated Bili, alk phos, and Lipase U/S shows cholelithiasis without cholecystitis or choledocholithiasis Will trend Lipase, CMP First Ranson <1 Denies alcohol use. No fevers - low likelihood for ascending cholangitis IVF NS @ 125 cc/hr, NPO 4 mg Morphine IV q 2 hours  2.Hypertension: (gestational) Hydralazine 20 mg q 6 hrs PRN sys>150 Labetalol - continue  3. S/p C- section- 2 months ago  4. Prophylaxis: Lovenox 40 mg qd, protonix.   5. Disposition: pending clinical improvement.   Edd Arbour MD 07/03/2011, 4:17 PM

## 2011-07-03 NOTE — Assessment & Plan Note (Signed)
I feel that there is a good chance pt's epigastric pain may be from reflux/indigestion. I will start on ranitidine at this time.  tums prn.    But since pt's discomfort has been significant off and on will do some blood work to look for other causes.  Will obtain CBC- to look for elevated WBC.  Will obtain lipase to assess pancreas.  CMET to look at electrolytes and liver function. Abdominal exam currently benign.  VS stable.  Pt to return within 1 week for recheck or sooner if new or worsening of symptoms.  Encouraged pt to drink lots of fluids.

## 2011-07-03 NOTE — H&P (Signed)
Family Medicine Teaching [facility] Admission History and Physical  Patient name: Andrea Morton Medical record number: [id number] Date of birth: 12/01/1990 Age: 21 y.o. Gender: female  Primary Care Provider: Ardyth Gal, MD, MD  Chief Complaint: abdominal pain  History of Present Illness: Andrea Morton is a 21 y.o. year old female presenting with epigatric abdominal pain x5 days.  Patient describes pain as sharp and located in the epigastric area. Has been off and on. Able to eat some solids and liquids until last night when she became unable to eat or drink fluids secondary to nausea and vomiting.  Patient describes pain as 9/10 today. No diarrhea. No constipation. No fever. No history of alcohol use. Seen yesterday in clinic and started on ranitidine and obtained lab work. Lab work returned today with a lipase of greater than 2000. Dr. Edmonia James called patient to check on her and tell her lab results. Patient had worsened and the pain more persistent. Patient agreed to come in to office today for recheck and possible admission.  Patient Active Problem List  Diagnoses  . OBESITY, CLASS III  . TOBACCO USER  . ELEVATED BLOOD PRESSURE WITHOUT DIAGNOSIS OF HYPERTENSION  . GENITAL HERPES  . Rash, skin  . ASCUS on Pap smear   Past Medical History: Past Medical History  Diagnosis Date  . Herpes, genital   . Hypertension   . Pregnancy induced hypertension   . Obesity     Past Surgical History: Past Surgical History  Procedure Date  . No past surgeries   . Cesarean section 05/03/2011    Procedure: CESAREAN SECTION;  Surgeon: Janine Limbo, MD;  Location: [facility];  Service: Gynecology;  Laterality: N/A;    Social History: History   Social History  . Marital Status: Single    Spouse Name: N/A    Number of Children: N/A  . Years of Education: N/A   Social History Main Topics  . Smoking status: Former Smoker -- 2 years    Types: Cigarettes    Quit  date: 10/13/2010  . Smokeless tobacco: Never Used  . Alcohol Use: No  . Drug Use: No  . Sexually Active: Yes    Birth Control/ Protection: Injection   Other Topics Concern  . Not on file   Social History Narrative   Unemployed, finished high school.1 child- 2 months old. No exercise. Doesn't eat a healthy diet.     Family History: Family History  Problem Relation Age of Onset  . Diabetes Mother   . Obesity Mother   . Asthma Sister   . Obesity Sister   . Diabetes Maternal Aunt   . Obesity Maternal Aunt   . Diabetes Maternal Grandmother   . Anesthesia problems Neg Hx     Allergies: No Known Allergies  Current Outpatient Prescriptions  Medication Sig Dispense Refill        . labetalol (NORMODYNE) 300 MG tablet Take 1 tablet (300 mg total) by mouth 3 (three) times daily.  90 tablet  2  . medroxyPROGESTERone (DEPO-PROVERA) 150 MG/ML injection Inject 1 mL (150 mg total) into the muscle every 3 (three) months.  1 mL  3  .       . ranitidine (ZANTAC) 150 MG tablet Take 1 tablet (150 mg total) by mouth 2 (two) times daily.  60 tablet  1  . valACYclovir (VALTREX) 500 MG tablet Take 1 tablet (500 mg total) by mouth 2 (two) times daily as needed. for outbreaks  30 tablet  2   Review Of Systems:  As per HPI.  No cold symptoms.  No weight changes.  No rashes.    Physical Exam: Pulse: 95  Blood Pressure: 170/86 RR: 20   O2: 97% on RA Temp: 99.2  General: alert, cooperative and morbidly obese HEENT: PERRLA and sclera clear, anicteric, acanthosis nigricans neck area.  Heart: S1, S2 normal, no murmur, rub or gallop, regular rate and rhythm Lungs: clear to auscultation, no wheezes or rales and unlabored breathing Abdomen: moderate tenderness in the in the epigastrium. No rebound. No guarding.  C-section scar well healed suprapubic area.  Extremities: extremities normal, atraumatic, no cyanosis or edema Skin:no rashes Neurology: normal without focal findings, PERLA and muscle tone and  strength normal and symmetric  Labs and Imaging: Lab Results  Component Value Date/Time   NA 142 07/02/2011  3:13 PM   K 4.0 07/02/2011  3:13 PM   CL 107 07/02/2011  3:13 PM   CO2 22 07/02/2011  3:13 PM   BUN 9 07/02/2011  3:13 PM   CREATININE 0.82 07/02/2011  3:13 PM   CREATININE 0.86 05/04/2011  5:55 AM   CREATININE 0.65 04/25/2011  1:25 PM   GLUCOSE 105* 07/02/2011  3:13 PM   Lab Results  Component Value Date   WBC 17.3* 07/02/2011   HGB 12.8 07/02/2011   HCT 40.3 07/02/2011   MCV 92.9 07/02/2011   PLT 425* 07/02/2011   Lipase >2000   Assessment and Plan: Andrea Morton is a 21 y.o. year old female presenting with abdominal pain:  1. Abdominal pain: Persistent abdominal pain.  Today 9/10 and unable to eat.  + n/v x 2 today.  X 3 last night.  Labs returned this am showing lipase of >2000.  Elevated WBC 17.3.  Concern for pancreatitis.  Will admit at this time.  Will repeat lipase to ensure accuracy of lab value.  will r/o gallstone with abdominal ultrasound. Will control pain with morphine prn. Zofran for nausea. Will start NS at 200cc/hr for hydration.  Will make npo for now, but will advance diet as tolerated as soon as nausea better controlled.  2.Hypertension: Has a history of hypertension managed by OBGYN on labetolol. ( recently gave birth- has a 86 month old).  Pt BP elevated on admission.  Will d/c labetolol at this time and monitor bp closely in the setting of possible acute pancreatitis.  Would prefer that pt be on aceinhibitor in the long run.  So could consider starting lisinopril in place of labetolol while in hospital.   3. S/p C- section- 2 months ago  -C/S scar well healed.  Pt has 34 month old child at home.   4. FEN/GI: npo for now, advance as tolerated.  NS at 200cc/hr  5. Prophylaxis: heparin 5000units, protonix.   6. Disposition: pending clinical improvement.

## 2011-07-03 NOTE — Telephone Encounter (Signed)
Pt called on phone.  Discussed elevated WBC count and lipase from lab draw yesterday.  Pt to return for recheck today.  Will consider redraw of lipase to confirm that this is correct lab value.  Also will discuss with pt possible hospital admission for abd pain and possible pancreatitis.

## 2011-07-04 ENCOUNTER — Encounter (HOSPITAL_COMMUNITY): Payer: Self-pay | Admitting: General Surgery

## 2011-07-04 ENCOUNTER — Inpatient Hospital Stay (HOSPITAL_COMMUNITY): Payer: Medicaid Other

## 2011-07-04 DIAGNOSIS — K859 Acute pancreatitis without necrosis or infection, unspecified: Secondary | ICD-10-CM

## 2011-07-04 DIAGNOSIS — K851 Biliary acute pancreatitis without necrosis or infection: Secondary | ICD-10-CM

## 2011-07-04 DIAGNOSIS — K8020 Calculus of gallbladder without cholecystitis without obstruction: Secondary | ICD-10-CM

## 2011-07-04 DIAGNOSIS — R109 Unspecified abdominal pain: Secondary | ICD-10-CM

## 2011-07-04 LAB — COMPREHENSIVE METABOLIC PANEL
ALT: 42 U/L — ABNORMAL HIGH (ref 0–35)
AST: 18 U/L (ref 0–37)
Albumin: 3.3 g/dL — ABNORMAL LOW (ref 3.5–5.2)
Chloride: 106 mEq/L (ref 96–112)
Creatinine, Ser: 0.8 mg/dL (ref 0.50–1.10)
Potassium: 3.5 mEq/L (ref 3.5–5.1)
Sodium: 141 mEq/L (ref 135–145)
Total Bilirubin: 0.5 mg/dL (ref 0.3–1.2)

## 2011-07-04 LAB — ABO/RH: ABO/RH(D): A POS

## 2011-07-04 LAB — CBC
MCH: 29.5 pg (ref 26.0–34.0)
MCHC: 33.1 g/dL (ref 30.0–36.0)
MCV: 89.1 fL (ref 78.0–100.0)
Platelets: 316 10*3/uL (ref 150–400)
RBC: 4.04 MIL/uL (ref 3.87–5.11)

## 2011-07-04 LAB — APTT: aPTT: 27 seconds (ref 24–37)

## 2011-07-04 LAB — TYPE AND SCREEN

## 2011-07-04 LAB — PROTIME-INR: Prothrombin Time: 14.5 seconds (ref 11.6–15.2)

## 2011-07-04 MED ORDER — HEPARIN SODIUM (PORCINE) 5000 UNIT/ML IJ SOLN
5000.0000 [IU] | Freq: Once | INTRAMUSCULAR | Status: DC
  Filled 2011-07-04: qty 1

## 2011-07-04 MED ORDER — CEFAZOLIN SODIUM-DEXTROSE 2-3 GM-% IV SOLR
2.0000 g | INTRAVENOUS | Status: AC
  Administered 2011-07-05: 2 g via INTRAVENOUS
  Filled 2011-07-04 (×3): qty 50

## 2011-07-04 MED ORDER — CEFAZOLIN SODIUM-DEXTROSE 2-3 GM-% IV SOLR
2.0000 g | INTRAVENOUS | Status: DC
  Filled 2011-07-04: qty 50

## 2011-07-04 MED ORDER — CHLORHEXIDINE GLUCONATE 4 % EX LIQD
1.0000 "application " | Freq: Once | CUTANEOUS | Status: DC
  Filled 2011-07-04: qty 15

## 2011-07-04 MED ORDER — ENOXAPARIN SODIUM 100 MG/ML ~~LOC~~ SOLN
0.5000 mg/kg | SUBCUTANEOUS | Status: DC
  Filled 2011-07-04: qty 1

## 2011-07-04 MED ORDER — ENOXAPARIN SODIUM 100 MG/ML ~~LOC~~ SOLN: 0.5000 mg/kg | SUBCUTANEOUS | Status: DC

## 2011-07-04 NOTE — H&P (Signed)
I have seen and examined this patient. I have discussed with Dr Rivka Safer and MS IV Chales Abrahams.  I agree with their findings and plans as documented in their progress note for today.

## 2011-07-04 NOTE — Consult Note (Signed)
Andrea Morton 23-Aug-1990  [id number].   Requesting MD: Dr. McDiarmid Chief Complaint/Reason for Consult: gallstone pancreatitis HPI: This is a 21 yo BF who is 2 months post-partum who began having abdominal pain on Friday.  This was transient and was gone by Saturday.  This returned on Sunday and was severe in nature.  This was located in the epigastric region.  She had associated nausea and vomiting.  She saw her PCP and was noted to have a lipase of >2000.  She was then admitted and an u/s was obtained which revealed gallstones, but no evidence of cholecystitis.  We have been asked to see the patient for cholecystectomy.  Review of System: Please see HPI; otherwise all other systems have been reviewed and are negative.  Family History  Problem Relation Age of Onset  . Diabetes Mother   . Obesity Mother   . Asthma Sister   . Obesity Sister   . Diabetes Maternal Aunt   . Obesity Maternal Aunt   . Diabetes Maternal Grandmother   . Anesthesia problems Neg Hx     Past Medical History  Diagnosis Date  . Herpes, genital   . Hypertension   . Pregnancy induced hypertension   . Obesity     Past Surgical History  Procedure Date  . No past surgeries   . Cesarean section 05/03/2011    Procedure: CESAREAN SECTION;  Surgeon: Janine Limbo, MD;  Location: [facility];  Service: Gynecology;  Laterality: N/A;    Social History:  reports that she quit smoking about 8 months ago. Her smoking use included Cigarettes. She quit after 2 years of use. She has never used smokeless tobacco. She reports that she does not drink alcohol or use illicit drugs.  Allergies: No Known Allergies  Medications Prior to Admission  Medication Dose Route Frequency Provider Last Rate Last Dose  . 0.9 %  sodium chloride infusion   Intravenous Continuous Edd Arbour, MD 125 mL/hr at 07/04/11 0417    . hydrALAZINE (APRESOLINE) injection 20 mg  20 mg Intravenous Q6H PRN Edd Arbour, MD      . morphine 4  MG/ML injection 4 mg  4 mg Intravenous Q2H PRN Edd Arbour, MD   4 mg at 07/04/11 1255  . morphine 4 MG/ML injection 4 mg  4 mg Intramuscular Q2H PRN Tana Conch, MD      . ondansetron [facility]) tablet 4 mg  4 mg Oral Q6H PRN Ellin Mayhew, MD       Or  . ondansetron (ZOFRAN) injection 4 mg  4 mg Intravenous Q6H PRN Ellin Mayhew, MD      . pantoprazole (PROTONIX) injection 40 mg  40 mg Intravenous QHS Ellin Mayhew, MD   40 mg at 07/03/11 2141  . DISCONTD: enoxaparin (LOVENOX) injection 40 mg  40 mg Subcutaneous Q24H Edd Arbour, MD      . DISCONTD: enoxaparin (LOVENOX) injection 80 mg  0.5 mg/kg Subcutaneous Q24H Gaspar Bidding, DO      . DISCONTD: enoxaparin (LOVENOX) injection 80 mg  0.5 mg/kg Subcutaneous Q24H Letha Cape, PA      . DISCONTD: heparin injection 5,000 Units  5,000 Units Subcutaneous Q8H Ellin Mayhew, MD      . DISCONTD: morphine 2 MG/ML injection 2 mg  2 mg Intravenous Q2H PRN Ellin Mayhew, MD   2 mg at 07/03/11 1419  . DISCONTD: morphine 4 MG/ML injection 4 mg  4 mg Intramuscular Q2H PRN Edd Arbour, MD  Medications Prior to Admission  Medication Sig Dispense Refill  . ranitidine (ZANTAC) 150 MG tablet Take 1 tablet (150 mg total) by mouth 2 (two) times daily.  60 tablet  1  . valACYclovir (VALTREX) 500 MG tablet Take 500 mg by mouth 2 (two) times daily as needed. for outbreaks      . medroxyPROGESTERone (DEPO-PROVERA) 150 MG/ML injection Inject 1 mL (150 mg total) into the muscle every 3 (three) months.  1 mL  3    Blood pressure 133/73, pulse 96, temperature 98.5 F (36.9 C), temperature source Oral, resp. rate 20, height 5\' 11"  (1.803 m), weight 354 lb 15.1 oz (161 kg), SpO2 99.00%. Physical Exam: General appearance: alert, cooperative, appears stated age, no distress and morbidly obese Head: Normocephalic, without obvious abnormality, atraumatic Eyes: conjunctivae/corneas clear. PERRL, EOM's intact. Fundi benign. Nose: no discharge, no obvious  masses  Lungs: clear to auscultation bilaterally and no wheezes, rhonchi, or rales.  respiratory effort nonlabored Heart: regular rate and rhythm, S1, S2 normal, no murmur, click, rub or gallop and palpable radial and pedal pulses bilaterally Abdomen: soft, mild epigastric tenderness with +BS, ND, obese.  No masses, hernias, or organomegaly, no murphy's sign  Extremities: extremities normal, atraumatic, no cyanosis or edema Skin: Skin color, texture, turgor normal. No rashes or lesions Psych: A&O x 3, appropriate affect    Results for orders placed during the hospital encounter of 07/03/11 (from the past 48 hour(s))  LIPASE, BLOOD     Status: Abnormal   Collection Time   07/03/11  2:30 PM      Component Value Range Comment   Lipase 158 (*) 11 - 59 (U/L)   LIPID PANEL     Status: Abnormal   Collection Time   07/03/11  2:30 PM      Component Value Range Comment   Cholesterol 210 (*) 0 - 200 (mg/dL)    Triglycerides 161  <150 (mg/dL)    HDL 38 (*) >09 (mg/dL)    Total CHOL/HDL Ratio 5.5      VLDL 20  0 - 40 (mg/dL)    LDL Cholesterol 604 (*) 0 - 99 (mg/dL)   COMPREHENSIVE METABOLIC PANEL     Status: Abnormal   Collection Time   07/04/11  4:35 AM      Component Value Range Comment   Sodium 141  135 - 145 (mEq/L)    Potassium 3.5  3.5 - 5.1 (mEq/L)    Chloride 106  96 - 112 (mEq/L)    CO2 23  19 - 32 (mEq/L)    Glucose, Bld 87  70 - 99 (mg/dL)    BUN 10  6 - 23 (mg/dL)    Creatinine, Ser 5.40  0.50 - 1.10 (mg/dL)    Calcium 9.1  8.4 - 10.5 (mg/dL)    Total Protein 7.0  6.0 - 8.3 (g/dL)    Albumin 3.3 (*) 3.5 - 5.2 (g/dL)    AST 18  0 - 37 (U/L)    ALT 42 (*) 0 - 35 (U/L)    Alkaline Phosphatase 118 (*) 39 - 117 (U/L)    Total Bilirubin 0.5  0.3 - 1.2 (mg/dL)    GFR calc non Af Amer >90  >90 (mL/min)    GFR calc Af Amer >90  >90 (mL/min)   CBC     Status: Abnormal   Collection Time   07/04/11  4:35 AM      Component Value Range Comment   WBC 11.0 (*) 4.0 -  10.5 (K/uL)    RBC  4.04  3.87 - 5.11 (MIL/uL)    Hemoglobin 11.9 (*) 12.0 - 15.0 (g/dL)    HCT 82.9  56.2 - 13.0 (%)    MCV 89.1  78.0 - 100.0 (fL)    MCH 29.5  26.0 - 34.0 (pg)    MCHC 33.1  30.0 - 36.0 (g/dL)    RDW 86.5  78.4 - 69.6 (%)    Platelets 316  150 - 400 (K/uL)   LIPASE, BLOOD     Status: Normal   Collection Time   07/04/11  4:35 AM      Component Value Range Comment   Lipase 54  11 - 59 (U/L)   PROTIME-INR     Status: Normal   Collection Time   07/04/11  8:53 AM      Component Value Range Comment   Prothrombin Time 14.5  11.6 - 15.2 (seconds)    INR 1.11  0.00 - 1.49    APTT     Status: Normal   Collection Time   07/04/11  8:53 AM      Component Value Range Comment   aPTT 27  24 - 37 (seconds)   TYPE AND SCREEN     Status: Normal   Collection Time   07/04/11  9:00 AM      Component Value Range Comment   ABO/RH(D) A POS      Antibody Screen NEG      Sample Expiration 07/07/2011     ABO/RH     Status: Normal   Collection Time   07/04/11  9:00 AM      Component Value Range Comment   ABO/RH(D) A POS      Dg Chest 1 View  07/04/2011  *RADIOLOGY REPORT*  Clinical Data: Preop gallbladder surgery.  CHEST - 1 VIEW  Comparison: None  Findings: Heart and mediastinal contours are within normal limits. No focal opacities or effusions.  No acute bony abnormality.  IMPRESSION: No active cardiopulmonary disease.  Original Report Authenticated By: Cyndie Chime, M.D.   US Abdomen Complete  07/03/2011  *RADIOLOGY REPORT*  Clinical Data:  Pancreatitis.  COMPLETE ABDOMINAL ULTRASOUND 07/03/2011:  Comparison:  None.  Findings:  Gallbladder:  Numerous small shadowing gallstones, the largest on the order of approximately 10 mm.  No gallbladder wall thickening or pericholecystic fluid.  Negative sonographic Murphy's sign according to the ultrasound technologist.  Common bile duct:  Upper normal in caliber to perhaps minimally dilated with maximum diameter approximating 8 mm.  No visible bile duct stones.   Liver:  Normal size and echotexture without focal parenchymal abnormality.  Patent portal vein with hepatopetal flow.  IVC:  Patent.  Pancreas:  Although the pancreas is difficult to visualize in its entirety, no focal pancreatic abnormality is identified.  The tail was obscured by overlying bowel gas.  Spleen:  Normal size and echotexture without focal parenchymal abnormality.  Right Kidney:  No hydronephrosis.  Well-preserved cortex.  No shadowing calculi.  Normal size and parenchymal echotexture without focal abnormalities.  Approximately 13.6 cm in length.  Left Kidney:  No hydronephrosis.  Well-preserved cortex.  No shadowing calculi.  Normal size and parenchymal echotexture without focal abnormalities.  Approximately 13.3 cm in length.  Abdominal aorta:  Normal in caliber in its proximal and mid portions; obscured distally by overlying bowel gas.  IMPRESSION:  1.  Cholelithiasis without sonographic evidence of acute cholecystitis. 2.  Upper normal caliber to slightly dilated common bile duct  measuring 8 mm.  No visible bile duct stones. 3.  Otherwise normal examination with a caveat that the pancreatic tail and the distal abdominal aorta were obscured by overlying bowel gas and were therefore not evaluated.  Original Report Authenticated By: Arnell Sieving, M.D.       Assessment/Plan 1. Gallstone pancreatitis 2. HTN  Plan: 1. Lipase is normalizing.  We will plan on proceeding with cholecystectomy tomorrow.  We will allow clear liquids today and NPO after MN tonight.  Dr. Derrell Lolling has seen the patient and has discussed surgery with her.  She and her sister and present and understand. 2. We will hold her lovenox tonight and tomorrow.  We will give her a dose of heparin on call to the OR tomorrow. 3. Thank you for this consult.  We will follow with you.  Kyren Knick E 07/04/2011, 1:30 PM

## 2011-07-04 NOTE — Consult Note (Signed)
I have personally interviewed and examined the patient, and discussed her surgical problem with her and her family. I agree with Ms. [employer].  This 21 year old woman is 2 months postpartum. She has had a C-section. Other medical problems include obesity and tobacco use. She has gallstones but there is no evidence of acute cholecystitis. She probably passed a common bile duct stone and got transient abdominal pain and hyperamylasemia. Her pain and abdominal tenderness have essentially resolved and I do not think she has clinical evidence of ongoing pancreatitis.. I think that she is a good candidate to proceed with laparoscopic cholecystectomy tomorrow, and after discussing this with her she is in full agreement.  Plan clear liquid diet today. NPO. After midnight. Discontinue Lovenox. Proceed with laparoscopic cholecystectomy with cholangiogram tomorrow. We will check lab work tomorrow morning early to be sure that her lipase and liver function tests don't go back up.  I discussed the indications and details of surgery with her and her family. Risks and complications have been outlined, including but not limited to bleeding, infection, conversion to open laparotomy, bile leak, injury to adjacent organs such as the intestine or main bile duct with major reconstructive surgery, cardiac, pulmonary, and thromboembolic problems. She understands these issues. Her questions were answered. She agrees with the plan.   Angelia Mould. Derrell Lolling, M.D., [facility] General and Minimally invasive Surgery Breast and Colorectal Surgery Office:   [phone] Pager:   [phone]

## 2011-07-04 NOTE — Progress Notes (Signed)
Subjective: AFVSS. Comfortable and cooperative this morning. Pain at epigastrium has decreased to 5/10 from 9/10 yesterday morning. No more episodes of emesis. No BMs. Informed that we will consult surgery for cholecystectomy and patient is agreeable.  Objective: Vital signs in last 24 hours: Temp:  [98.5 F (36.9 C)-99.2 F (37.3 C)] 98.5 F (36.9 C) (01/30 0610) Pulse Rate:  [80-96] 96  (01/30 0610) Resp:  [20] 20  (01/30 0610) BP: (120-170)/(71-86) 133/73 mmHg (01/30 0610) SpO2:  [97 %-99 %] 99 % (01/30 0610) Weight:  [161 kg (354 lb 15.1 oz)-161.435 kg (355 lb 14.4 oz)] 161 kg (354 lb 15.1 oz) (01/29 2118) Weight change:  Last BM Date: 07/01/11  Intake/Output from previous day:   Intake/Output this shift:   Physical Exam: General appearance: alert, cooperative and no distress Head: Normocephalic, without obvious abnormality, atraumatic Resp: Normal WOB. CTAB. Cardio: regular rate and rhythm, S1, S2 normal, no murmur, click, rub or gallop GI: Soft, Tender to moderate palpation in epigastrium, no guarding, ND, No masses. BS+. Negative murphey's sign. Extremities: extremities normal, atraumatic, no cyanosis or edema Neuro: CNII-XII grossly intact. AOx3  Lab Results:  Basename 07/04/11 0435 07/02/11 1513  WBC 11.0* 17.3*  HGB 11.9* 12.8  HCT 36.0 40.3  PLT 316 425*   BMET  Basename 07/04/11 0435 07/02/11 1513  NA 141 142  K 3.5 4.0  CL 106 107  CO2 23 22  GLUCOSE 87 105*  BUN 10 9  CREATININE 0.80 0.82  CALCIUM 9.1 9.7    Studies/Results: US Abdomen Complete  07/03/2011  *RADIOLOGY REPORT*  Clinical Data:  Pancreatitis.  COMPLETE ABDOMINAL ULTRASOUND 07/03/2011:  Comparison:  None.  Findings:  Gallbladder:  Numerous small shadowing gallstones, the largest on the order of approximately 10 mm.  No gallbladder wall thickening or pericholecystic fluid.  Negative sonographic Murphy's sign according to the ultrasound technologist.  Common bile duct:  Upper normal in  caliber to perhaps minimally dilated with maximum diameter approximating 8 mm.  No visible bile duct stones.  Liver:  Normal size and echotexture without focal parenchymal abnormality.  Patent portal vein with hepatopetal flow.  IVC:  Patent.  Pancreas:  Although the pancreas is difficult to visualize in its entirety, no focal pancreatic abnormality is identified.  The tail was obscured by overlying bowel gas.  Spleen:  Normal size and echotexture without focal parenchymal abnormality.  Right Kidney:  No hydronephrosis.  Well-preserved cortex.  No shadowing calculi.  Normal size and parenchymal echotexture without focal abnormalities.  Approximately 13.6 cm in length.  Left Kidney:  No hydronephrosis.  Well-preserved cortex.  No shadowing calculi.  Normal size and parenchymal echotexture without focal abnormalities.  Approximately 13.3 cm in length.  Abdominal aorta:  Normal in caliber in its proximal and mid portions; obscured distally by overlying bowel gas.  IMPRESSION:  1.  Cholelithiasis without sonographic evidence of acute cholecystitis. 2.  Upper normal caliber to slightly dilated common bile duct measuring 8 mm.  No visible bile duct stones. 3.  Otherwise normal examination with a caveat that the pancreatic tail and the distal abdominal aorta were obscured by overlying bowel gas and were therefore not evaluated.  Original Report Authenticated By: Arnell Sieving, M.D.    Medications: I have reviewed the patient's current medications.  Assessment/Plan: 21 y/o aaf with acute pancreatitis, most likely gallstone pancreatitis from passed stone.   1. GI: Epigastric abdominal pain likely Gallstone Pancreatitis given U/S showing cholelithiasis without cholecystitis or choledocholithiasis. Stone likely passed. -  Bili, alk phos and lipase all trending down. Normal WBC and no fever. - Continue IVF NS @ 125 cc/hr, NPO  - 4 mg Morphine IV q 2 hours PRN [facility] consulted to evaluate for  CCY. Will check PT/INR, Type and Screen and CXR.  2.Hypertension: (gestational)  - Hydralazine 20 mg q 6 hrs PRN sys>150  - Hold home Labetalol  3. S/p C- section- 2 months ago   4. Endocrine: Hyperlipidemia - Recommend outpatient follow-up to consider Statin.  4. Prophylaxis: Lovenox 40 mg qd, protonix.   5. Disposition: Pending surgery consult and clinical improvement.    LOS: 1 day   Beverly Milch [facility] 07/04/2011, 8:51 AM  Edd Arbour 9:27 AM 07/04/2011  Patient seen and examined. Reviewed above progress note by Beverly Milch MS4 and agree.  Filed Vitals:   07/03/11 1420 07/03/11 1700 07/03/11 2118 07/04/11 0610  BP: 120/71  122/75 133/73  Pulse: 85  89 96  Temp: 98.6 F (37 C)  98.8 F (37.1 C) 98.5 F (36.9 C)  TempSrc:   Oral Oral  Resp: 20  20 20   Height:  5\' 10"  (1.778 m) 5\' 11"  (1.803 m)   Weight:  161.35 kg (355 lb 11.4 oz) 161 kg (354 lb 15.1 oz)   SpO2: 99%  97% 99%   PE:  Lungs:  Normal respiratory effort, chest expands symmetrically. Lungs are clear to auscultation, no crackles or wheezes. Heart - Regular rate and rhythm.  No murmurs, gallops or rubs.    Abdomen: soft and tender in epigastrium, without masses, organomegaly or hernias noted.  No guarding or rebound. pfannenstiel scar, healed. Extremities:  No cyanosis, edema, or deformity   A/p: 21 y/o aaf with gallstone pancreatitis s/p stone passage with resolution of Lipase/Bili for Lap chole today pending general surgery consult. - preop workup done, patient NPO - will dc home pending surgery input.  Ilhan Debenedetto 9:30 AM 07/04/2011

## 2011-07-05 ENCOUNTER — Other Ambulatory Visit (INDEPENDENT_AMBULATORY_CARE_PROVIDER_SITE_OTHER): Payer: Self-pay | Admitting: General Surgery

## 2011-07-05 ENCOUNTER — Inpatient Hospital Stay (HOSPITAL_COMMUNITY): Payer: Medicaid Other

## 2011-07-05 ENCOUNTER — Inpatient Hospital Stay (HOSPITAL_COMMUNITY): Payer: Medicaid Other | Admitting: Anesthesiology

## 2011-07-05 ENCOUNTER — Encounter (HOSPITAL_COMMUNITY): Payer: Self-pay | Admitting: Certified Registered"

## 2011-07-05 ENCOUNTER — Encounter (HOSPITAL_COMMUNITY)
Admission: AD | Disposition: A | Discharge status: Home / Self Care | Payer: Self-pay | Source: Ambulatory Visit | Attending: Family Medicine

## 2011-07-05 ENCOUNTER — Encounter (HOSPITAL_COMMUNITY): Payer: Self-pay | Admitting: Anesthesiology

## 2011-07-05 DIAGNOSIS — K824 Cholesterolosis of gallbladder: Secondary | ICD-10-CM

## 2011-07-05 DIAGNOSIS — K8010 Calculus of gallbladder with chronic cholecystitis without obstruction: Secondary | ICD-10-CM

## 2011-07-05 HISTORY — PX: CHOLECYSTECTOMY: SHX55

## 2011-07-05 LAB — COMPREHENSIVE METABOLIC PANEL
ALT: 30 U/L (ref 0–35)
AST: 15 U/L (ref 0–37)
Albumin: 3.1 g/dL — ABNORMAL LOW (ref 3.5–5.2)
CO2: 24 mEq/L (ref 19–32)
Chloride: 107 mEq/L (ref 96–112)
Creatinine, Ser: 0.78 mg/dL (ref 0.50–1.10)
Sodium: 141 mEq/L (ref 135–145)
Total Bilirubin: 0.3 mg/dL (ref 0.3–1.2)

## 2011-07-05 LAB — CBC
HCT: 35.3 % — ABNORMAL LOW (ref 36.0–46.0)
MCHC: 33.4 g/dL (ref 30.0–36.0)
MCV: 89.8 fL (ref 78.0–100.0)
MCV: 89.8 fL (ref 78.0–100.0)
Platelets: 290 10*3/uL (ref 150–400)
Platelets: 283 10*3/uL (ref 150–400)
RBC: 3.93 MIL/uL (ref 3.87–5.11)
RDW: 14.5 % (ref 11.5–15.5)
RDW: 14.4 % (ref 11.5–15.5)
WBC: 7.9 10*3/uL (ref 4.0–10.5)
WBC: 12 10*3/uL — ABNORMAL HIGH (ref 4.0–10.5)

## 2011-07-05 SURGERY — LAPAROSCOPIC CHOLECYSTECTOMY WITH INTRAOPERATIVE CHOLANGIOGRAM
Anesthesia: General | Site: Abdomen | Wound class: Contaminated

## 2011-07-05 MED ORDER — DROPERIDOL 2.5 MG/ML IJ SOLN
0.6250 mg | INTRAMUSCULAR | Status: DC | PRN
  Filled 2011-07-05: qty 0.25

## 2011-07-05 MED ORDER — MIDAZOLAM HCL 5 MG/5ML IJ SOLN
INTRAMUSCULAR | Status: DC | PRN
  Administered 2011-07-05: 2 mg via INTRAVENOUS

## 2011-07-05 MED ORDER — ONDANSETRON HCL 4 MG PO TABS: 4.0000 mg | ORAL_TABLET | Freq: Four times a day (QID) | ORAL | Status: DC | PRN

## 2011-07-05 MED ORDER — FAMOTIDINE 20 MG PO TABS
20.0000 mg | ORAL_TABLET | Freq: Every day | ORAL | Status: DC
  Filled 2011-07-05: qty 1

## 2011-07-05 MED ORDER — ROCURONIUM BROMIDE 100 MG/10ML IV SOLN
INTRAVENOUS | Status: DC | PRN
  Administered 2011-07-05: 50 mg via INTRAVENOUS

## 2011-07-05 MED ORDER — PROPOFOL 10 MG/ML IV EMUL
INTRAVENOUS | Status: DC | PRN
  Administered 2011-07-05: 150 mg via INTRAVENOUS
  Administered 2011-07-05: 200 mg via INTRAVENOUS

## 2011-07-05 MED ORDER — FENTANYL CITRATE 0.05 MG/ML IJ SOLN
INTRAMUSCULAR | Status: DC | PRN
  Administered 2011-07-05 (×3): 50 ug via INTRAVENOUS
  Administered 2011-07-05: 150 ug via INTRAVENOUS

## 2011-07-05 MED ORDER — MEDROXYPROGESTERONE ACETATE 150 MG/ML IM SUSP: 150.0000 mg | INTRAMUSCULAR | Status: DC

## 2011-07-05 MED ORDER — ONDANSETRON HCL 4 MG/2ML IJ SOLN
INTRAMUSCULAR | Status: DC | PRN
  Administered 2011-07-05 (×2): 4 mg via INTRAVENOUS

## 2011-07-05 MED ORDER — HYDROMORPHONE HCL PF 1 MG/ML IJ SOLN
0.2500 mg | INTRAMUSCULAR | Status: DC | PRN
Start: 2011-07-05 — End: 2011-07-05

## 2011-07-05 MED ORDER — HEPARIN SODIUM (PORCINE) 5000 UNIT/ML IJ SOLN
5000.0000 [IU] | Freq: Three times a day (TID) | INTRAMUSCULAR | Status: DC
  Filled 2011-07-05 (×3): qty 1

## 2011-07-05 MED ORDER — HYDROCODONE-ACETAMINOPHEN 5-325 MG PO TABS
1.0000 | ORAL_TABLET | ORAL | Status: DC | PRN
  Administered 2011-07-05: 2 via ORAL
  Filled 2011-07-05: qty 2

## 2011-07-05 MED ORDER — ONDANSETRON HCL 4 MG/2ML IJ SOLN: 4.0000 mg | Freq: Four times a day (QID) | INTRAMUSCULAR | Status: DC | PRN

## 2011-07-05 MED ORDER — CEFAZOLIN SODIUM-DEXTROSE 2-3 GM-% IV SOLR
2.0000 g | Freq: Three times a day (TID) | INTRAVENOUS | Status: DC
  Administered 2011-07-05 – 2011-07-06 (×3): 2 g via INTRAVENOUS
  Filled 2011-07-05 (×4): qty 50

## 2011-07-05 MED ORDER — 0.9 % SODIUM CHLORIDE (POUR BTL) OPTIME
TOPICAL | Status: DC | PRN
  Administered 2011-07-05: 1000 mL

## 2011-07-05 MED ORDER — GLYCOPYRROLATE 0.2 MG/ML IJ SOLN
INTRAMUSCULAR | Status: DC | PRN
  Administered 2011-07-05: .8 mg via INTRAVENOUS

## 2011-07-05 MED ORDER — LACTATED RINGERS IV SOLN
INTRAVENOUS | Status: DC | PRN
  Administered 2011-07-05 (×2): via INTRAVENOUS

## 2011-07-05 MED ORDER — IOHEXOL 300 MG/ML  SOLN
INTRAMUSCULAR | Status: DC | PRN
  Administered 2011-07-05: 40 mL

## 2011-07-05 MED ORDER — NIFEDIPINE ER OSMOTIC RELEASE 90 MG PO TB24
90.0000 mg | ORAL_TABLET | Freq: Every day | ORAL | Status: DC
  Administered 2011-07-05: 90 mg via ORAL
  Filled 2011-07-05 (×2): qty 1

## 2011-07-05 MED ORDER — NEOSTIGMINE METHYLSULFATE 1 MG/ML IJ SOLN
INTRAMUSCULAR | Status: DC | PRN
  Administered 2011-07-05: 5 mg via INTRAVENOUS

## 2011-07-05 MED ORDER — POTASSIUM CHLORIDE IN NACL 20-0.9 MEQ/L-% IV SOLN
INTRAVENOUS | Status: DC
  Administered 2011-07-05 – 2011-07-06 (×3): via INTRAVENOUS
  Filled 2011-07-05 (×4): qty 1000

## 2011-07-05 MED ORDER — SODIUM CHLORIDE 0.9 % IR SOLN
Status: DC | PRN
  Administered 2011-07-05: 1000 mL

## 2011-07-05 MED ORDER — MORPHINE SULFATE 4 MG/ML IJ SOLN: 4.0000 mg | INTRAMUSCULAR | Status: DC | PRN

## 2011-07-05 MED ORDER — BUPIVACAINE-EPINEPHRINE 0.25% -1:200000 IJ SOLN
INTRAMUSCULAR | Status: DC | PRN
  Administered 2011-07-05: 8 mL

## 2011-07-05 MED ORDER — VALACYCLOVIR HCL 500 MG PO TABS
500.0000 mg | ORAL_TABLET | Freq: Two times a day (BID) | ORAL | Status: DC | PRN
  Filled 2011-07-05: qty 1

## 2011-07-05 MED ORDER — SUCCINYLCHOLINE CHLORIDE 20 MG/ML IJ SOLN
INTRAMUSCULAR | Status: DC | PRN
  Administered 2011-07-05: 100 mg via INTRAVENOUS

## 2011-07-05 MED ORDER — LIDOCAINE HCL (CARDIAC) 20 MG/ML IV SOLN
INTRAVENOUS | Status: DC | PRN
  Administered 2011-07-05: 100 mg via INTRAVENOUS

## 2011-07-05 MED ORDER — LACTATED RINGERS IV SOLN
INTRAVENOUS | Status: DC
  Administered 2011-07-05: 13:00:00 via INTRAVENOUS

## 2011-07-05 MED ORDER — VALACYCLOVIR HCL 500 MG PO TABS: 500.0000 mg | ORAL_TABLET | Freq: Two times a day (BID) | ORAL | Status: DC | PRN

## 2011-07-05 SURGICAL SUPPLY — 50 items
APPLIER CLIP ROT 10 11.4 M/L (STAPLE) ×2
BLADE SURG ROTATE 9660 (MISCELLANEOUS) IMPLANT
CANISTER SUCTION 2500CC (MISCELLANEOUS) ×2 IMPLANT
CHLORAPREP W/TINT 26ML (MISCELLANEOUS) ×2 IMPLANT
CLIP APPLIE ROT 10 11.4 M/L (STAPLE) ×1 IMPLANT
CLOTH BEACON ORANGE TIMEOUT ST (SAFETY) ×2 IMPLANT
COVER MAYO STAND STRL (DRAPES) ×2 IMPLANT
COVER SURGICAL LIGHT HANDLE (MISCELLANEOUS) ×2 IMPLANT
DECANTER SPIKE VIAL GLASS SM (MISCELLANEOUS) ×4 IMPLANT
DERMABOND ADVANCED (GAUZE/BANDAGES/DRESSINGS) ×1
DERMABOND ADVANCED .7 DNX12 (GAUZE/BANDAGES/DRESSINGS) ×1 IMPLANT
DRAPE C-ARM 42X72 X-RAY (DRAPES) ×2 IMPLANT
ELECT REM PT RETURN 9FT ADLT (ELECTROSURGICAL) ×2
ELECTRODE REM PT RTRN 9FT ADLT (ELECTROSURGICAL) ×1 IMPLANT
GLOVE BIO SURGEON STRL SZ7.5 (GLOVE) ×2 IMPLANT
GLOVE BIO SURGEON STRL SZ8 (GLOVE) ×2 IMPLANT
GLOVE BIOGEL PI IND STRL 6.5 (GLOVE) ×1 IMPLANT
GLOVE BIOGEL PI IND STRL 7.0 (GLOVE) ×1 IMPLANT
GLOVE BIOGEL PI IND STRL 7.5 (GLOVE) ×1 IMPLANT
GLOVE BIOGEL PI IND STRL 8 (GLOVE) ×1 IMPLANT
GLOVE BIOGEL PI IND STRL 8.5 (GLOVE) ×1 IMPLANT
GLOVE BIOGEL PI INDICATOR 6.5 (GLOVE) ×1
GLOVE BIOGEL PI INDICATOR 7.0 (GLOVE) ×1
GLOVE BIOGEL PI INDICATOR 7.5 (GLOVE) ×1
GLOVE BIOGEL PI INDICATOR 8 (GLOVE) ×1
GLOVE BIOGEL PI INDICATOR 8.5 (GLOVE) ×1
GLOVE ECLIPSE 6.5 STRL STRAW (GLOVE) ×2 IMPLANT
GLOVE ECLIPSE 7.0 STRL STRAW (GLOVE) ×2 IMPLANT
GLOVE EUDERMIC 7 POWDERFREE (GLOVE) ×2 IMPLANT
GLOVE SURG SS PI 6.5 STRL IVOR (GLOVE) ×2 IMPLANT
GOWN PREVENTION PLUS XLARGE (GOWN DISPOSABLE) ×4 IMPLANT
GOWN STRL NON-REIN LRG LVL3 (GOWN DISPOSABLE) ×8 IMPLANT
KIT BASIN OR (CUSTOM PROCEDURE TRAY) ×2 IMPLANT
KIT ROOM TURNOVER OR (KITS) ×2 IMPLANT
NS IRRIG 1000ML POUR BTL (IV SOLUTION) ×2 IMPLANT
PAD ARMBOARD 7.5X6 YLW CONV (MISCELLANEOUS) ×2 IMPLANT
POUCH SPECIMEN RETRIEVAL 10MM (ENDOMECHANICALS) ×2 IMPLANT
SCISSORS LAP 5X35 DISP (ENDOMECHANICALS) IMPLANT
SET CHOLANGIOGRAPH 5 50 .035 (SET/KITS/TRAYS/PACK) ×2 IMPLANT
SET IRRIG TUBING LAPAROSCOPIC (IRRIGATION / IRRIGATOR) ×2 IMPLANT
SLEEVE ENDOPATH XCEL 5M (ENDOMECHANICALS) ×2 IMPLANT
SPECIMEN JAR MEDIUM (MISCELLANEOUS) ×2 IMPLANT
SPECIMEN JAR SMALL (MISCELLANEOUS) IMPLANT
SUT MNCRL AB 4-0 PS2 18 (SUTURE) ×2 IMPLANT
TOWEL OR 17X24 6PK STRL BLUE (TOWEL DISPOSABLE) ×2 IMPLANT
TOWEL OR 17X26 10 PK STRL BLUE (TOWEL DISPOSABLE) ×2 IMPLANT
TRAY LAPAROSCOPIC (CUSTOM PROCEDURE TRAY) ×2 IMPLANT
TROCAR XCEL BLUNT TIP 100MML (ENDOMECHANICALS) ×2 IMPLANT
TROCAR XCEL NON-BLD 11X100MML (ENDOMECHANICALS) ×2 IMPLANT
TROCAR XCEL NON-BLD 5MMX100MML (ENDOMECHANICALS) ×2 IMPLANT

## 2011-07-05 NOTE — Preoperative (Signed)
Beta Blockers   Reason not to administer Beta Blockers:Not Applicable 

## 2011-07-05 NOTE — Discharge Summary (Signed)
Physician Discharge Summary  Patient ID: Andrea Morton MRN: [id number] DOB: April 30, 1991 Age: 21 y.o.  Admit date: 07/03/2011 Discharge date: 07/06/2011  PCP: Ardyth Gal, MD, MD  Consultants: Angelia Mould. Derrell Lolling, MD. [facility] Rob Bunting, MD. [facility]   Discharge Diagnosis: Principal Problem:  *Gallstone pancreatitis Secondary Problems: 1. Morbid Obesity 2. Genital Herpes 3. Undiagnosed Hypertension 4. Hyperlipidemia 5. ASCUS on Pap Smear 6. History of Smoking   Hospital Course 21 y/o morbidly obese AAF 2 months s/p c-section who presented with epigastric abdominal pain secondary to acute pancreatitis most likely from a past gallstone.  1. Gallstone Pancreatitis: Patient had severe epigastric abdominal pain on admission with Lipase greater than 2000 and elevated transaminases, Tbili, Alk Phos and WBC. RUS was consistent with cholelithiasis without cholecystitis or choledocholithiasis. Patient made NPO and was given fluids. Pain improved within 24 hours and bilirubin, alk phos, lipase, transaminases and WBC normalized. On HD3 patient had a cholecystectomy. Was well at time of discharge. She also did not have a BM through admission. Patient advised to resume diet, ambulate and use Miralax if needed.  2.Hypertension: Patient had previous diagnosis of gestational hypertension with question of underlying chronic hypertension. Home Labetalol was held and patient was placed on PRN Hydralazine, but did not require any as was normotensive throughout admission. Will hold Labetalol and recommend outpatient follow-up to monitor blood pressure.  3. Hyperlipidemia: Lipid panel ordered and found to have LDL of 152 and HDL of 38. Recommend outpatient follow-up to start statin given possible metabolic syndrome (obesity, low HDL, possible hypertension).  4. Prophylaxis: Was placed on Heparin and Protonix through admission.   Procedures/Imaging:  Dg Chest 1  View  07/04/2011  *RADIOLOGY REPORT*  Clinical Data: Preop gallbladder surgery.  CHEST - 1 VIEW  Comparison: None  Findings: Heart and mediastinal contours are within normal limits. No focal opacities or effusions.  No acute bony abnormality.  IMPRESSION: No active cardiopulmonary disease.  Original Report Authenticated By: Cyndie Chime, M.D.   US Abdomen Complete  07/03/2011  *RADIOLOGY REPORT*  Clinical Data:  Pancreatitis.  COMPLETE ABDOMINAL ULTRASOUND 07/03/2011:  Comparison:  None.  Findings:  Gallbladder:  Numerous small shadowing gallstones, the largest on the order of approximately 10 mm.  No gallbladder wall thickening or pericholecystic fluid.  Negative sonographic Murphy's sign according to the ultrasound technologist.  Common bile duct:  Upper normal in caliber to perhaps minimally dilated with maximum diameter approximating 8 mm.  No visible bile duct stones.  Liver:  Normal size and echotexture without focal parenchymal abnormality.  Patent portal vein with hepatopetal flow.  IVC:  Patent.  Pancreas:  Although the pancreas is difficult to visualize in its entirety, no focal pancreatic abnormality is identified.  The tail was obscured by overlying bowel gas.  Spleen:  Normal size and echotexture without focal parenchymal abnormality.  Right Kidney:  No hydronephrosis.  Well-preserved cortex.  No shadowing calculi.  Normal size and parenchymal echotexture without focal abnormalities.  Approximately 13.6 cm in length.  Left Kidney:  No hydronephrosis.  Well-preserved cortex.  No shadowing calculi.  Normal size and parenchymal echotexture without focal abnormalities.  Approximately 13.3 cm in length.  Abdominal aorta:  Normal in caliber in its proximal and mid portions; obscured distally by overlying bowel gas.  IMPRESSION:  1.  Cholelithiasis without sonographic evidence of acute cholecystitis. 2.  Upper normal caliber to slightly dilated common bile duct measuring 8 mm.  No visible bile duct  stones. 3.  Otherwise normal  examination with a caveat that the pancreatic tail and the distal abdominal aorta were obscured by overlying bowel gas and were therefore not evaluated.  Original Report Authenticated By: Arnell Sieving, M.D.    Labs  CBC  Lab 07/06/11 0605 07/05/11 1735 07/05/11 0650  WBC 6.6 12.0* 7.9  HGB 11.6* 11.8* 11.7*  HCT 34.6* 35.3* 35.3*  PLT 299 283 290   BMET  Lab 07/06/11 0605 07/05/11 1735 07/05/11 0835 07/04/11 0435  NA 140 -- 141 141  K 3.7 -- 4.0 3.5  CL 106 -- 107 106  CO2 24 -- 24 23  BUN 4* -- 6 10  CREATININE 0.79 0.77 0.78 --  CALCIUM 9.0 -- 9.1 9.1  PROT 6.7 -- 6.8 7.0  BILITOT 1.1 -- 0.3 0.5  ALKPHOS 180* -- 99 118*  ALT 166* -- 30 42*  AST 210* -- 15 18  GLUCOSE 111* -- 86 87   Results for orders placed during the hospital encounter of 07/03/11 (from the past 72 hour(s))  LIPASE, BLOOD     Status: Abnormal   Collection Time   07/03/11  2:30 PM      Component Value Range Comment   Lipase 158 (*) 11 - 59 (U/L)   LIPID PANEL     Status: Abnormal   Collection Time   07/03/11  2:30 PM      Component Value Range Comment   Cholesterol 210 (*) 0 - 200 (mg/dL)    Triglycerides 409  <150 (mg/dL)    HDL 38 (*) >81 (mg/dL)    Total CHOL/HDL Ratio 5.5      VLDL 20  0 - 40 (mg/dL)    LDL Cholesterol 191 (*) 0 - 99 (mg/dL)   COMPREHENSIVE METABOLIC PANEL     Status: Abnormal   Collection Time   07/04/11  4:35 AM      Component Value Range Comment   Sodium 141  135 - 145 (mEq/L)    Potassium 3.5  3.5 - 5.1 (mEq/L)    Chloride 106  96 - 112 (mEq/L)    CO2 23  19 - 32 (mEq/L)    Glucose, Bld 87  70 - 99 (mg/dL)    BUN 10  6 - 23 (mg/dL)    Creatinine, Ser 4.78  0.50 - 1.10 (mg/dL)    Calcium 9.1  8.4 - 10.5 (mg/dL)    Total Protein 7.0  6.0 - 8.3 (g/dL)    Albumin 3.3 (*) 3.5 - 5.2 (g/dL)    AST 18  0 - 37 (U/L)    ALT 42 (*) 0 - 35 (U/L)    Alkaline Phosphatase 118 (*) 39 - 117 (U/L)    Total Bilirubin 0.5  0.3 - 1.2 (mg/dL)    GFR  calc non Af Amer >90  >90 (mL/min)    GFR calc Af Amer >90  >90 (mL/min)   CBC     Status: Abnormal   Collection Time   07/04/11  4:35 AM      Component Value Range Comment   WBC 11.0 (*) 4.0 - 10.5 (K/uL)    RBC 4.04  3.87 - 5.11 (MIL/uL)    Hemoglobin 11.9 (*) 12.0 - 15.0 (g/dL)    HCT 29.5  62.1 - 30.8 (%)    MCV 89.1  78.0 - 100.0 (fL)    MCH 29.5  26.0 - 34.0 (pg)    MCHC 33.1  30.0 - 36.0 (g/dL)    RDW 65.7  84.6 - 96.2 (%)  Platelets 316  150 - 400 (K/uL)   LIPASE, BLOOD     Status: Normal   Collection Time   07/04/11  4:35 AM      Component Value Range Comment   Lipase 54  11 - 59 (U/L)   PROTIME-INR     Status: Normal   Collection Time   07/04/11  8:53 AM      Component Value Range Comment   Prothrombin Time 14.5  11.6 - 15.2 (seconds)    INR 1.11  0.00 - 1.49    APTT     Status: Normal   Collection Time   07/04/11  8:53 AM      Component Value Range Comment   aPTT 27  24 - 37 (seconds)   TYPE AND SCREEN     Status: Normal   Collection Time   07/04/11  9:00 AM      Component Value Range Comment   ABO/RH(D) A POS      Antibody Screen NEG      Sample Expiration 07/07/2011     ABO/RH     Status: Normal   Collection Time   07/04/11  9:00 AM      Component Value Range Comment   ABO/RH(D) A POS     CBC     Status: Abnormal   Collection Time   07/05/11  6:50 AM      Component Value Range Comment   WBC 7.9  4.0 - 10.5 (K/uL)    RBC 3.93  3.87 - 5.11 (MIL/uL)    Hemoglobin 11.7 (*) 12.0 - 15.0 (g/dL)    HCT 16.1 (*) 09.6 - 46.0 (%)    MCV 89.8  78.0 - 100.0 (fL)    MCH 29.8  26.0 - 34.0 (pg)    MCHC 33.1  30.0 - 36.0 (g/dL)    RDW 04.5  40.9 - 81.1 (%)    Platelets 290  150 - 400 (K/uL)   COMPREHENSIVE METABOLIC PANEL     Status: Abnormal   Collection Time   07/05/11  8:35 AM      Component Value Range Comment   Sodium 141  135 - 145 (mEq/L)    Potassium 4.0  3.5 - 5.1 (mEq/L)    Chloride 107  96 - 112 (mEq/L)    CO2 24  19 - 32 (mEq/L)    Glucose, Bld 86  70  - 99 (mg/dL)    BUN 6  6 - 23 (mg/dL)    Creatinine, Ser 9.14  0.50 - 1.10 (mg/dL)    Calcium 9.1  8.4 - 10.5 (mg/dL)    Total Protein 6.8  6.0 - 8.3 (g/dL)    Albumin 3.1 (*) 3.5 - 5.2 (g/dL)    AST 15  0 - 37 (U/L)    ALT 30  0 - 35 (U/L)    Alkaline Phosphatase 99  39 - 117 (U/L)    Total Bilirubin 0.3  0.3 - 1.2 (mg/dL)    GFR calc non Af Amer >90  >90 (mL/min)    GFR calc Af Amer >90  >90 (mL/min)   LIPASE, BLOOD     Status: Normal   Collection Time   07/05/11  8:35 AM      Component Value Range Comment   Lipase 18  11 - 59 (U/L)   CBC     Status: Abnormal   Collection Time   07/05/11  5:35 PM      Component Value Range Comment  WBC 12.0 (*) 4.0 - 10.5 (K/uL)    RBC 3.93  3.87 - 5.11 (MIL/uL)    Hemoglobin 11.8 (*) 12.0 - 15.0 (g/dL)    HCT 40.9 (*) 81.1 - 46.0 (%)    MCV 89.8  78.0 - 100.0 (fL)    MCH 30.0  26.0 - 34.0 (pg)    MCHC 33.4  30.0 - 36.0 (g/dL)    RDW 91.4  78.2 - 95.6 (%)    Platelets 283  150 - 400 (K/uL)   CREATININE, SERUM     Status: Normal   Collection Time   07/05/11  5:35 PM      Component Value Range Comment   Creatinine, Ser 0.77  0.50 - 1.10 (mg/dL)    GFR calc non Af Amer >90  >90 (mL/min)    GFR calc Af Amer >90  >90 (mL/min)   COMPREHENSIVE METABOLIC PANEL     Status: Abnormal   Collection Time   07/06/11  6:05 AM      Component Value Range Comment   Sodium 140  135 - 145 (mEq/L)    Potassium 3.7  3.5 - 5.1 (mEq/L)    Chloride 106  96 - 112 (mEq/L)    CO2 24  19 - 32 (mEq/L)    Glucose, Bld 111 (*) 70 - 99 (mg/dL)    BUN 4 (*) 6 - 23 (mg/dL)    Creatinine, Ser 2.13  0.50 - 1.10 (mg/dL)    Calcium 9.0  8.4 - 10.5 (mg/dL)    Total Protein 6.7  6.0 - 8.3 (g/dL)    Albumin 3.0 (*) 3.5 - 5.2 (g/dL)    AST 086 (*) 0 - 37 (U/L)    ALT 166 (*) 0 - 35 (U/L)    Alkaline Phosphatase 180 (*) 39 - 117 (U/L)    Total Bilirubin 1.1  0.3 - 1.2 (mg/dL)    GFR calc non Af Amer >90  >90 (mL/min)    GFR calc Af Amer >90  >90 (mL/min)   CBC     Status:  Abnormal   Collection Time   07/06/11  6:05 AM      Component Value Range Comment   WBC 6.6  4.0 - 10.5 (K/uL)    RBC 3.90  3.87 - 5.11 (MIL/uL)    Hemoglobin 11.6 (*) 12.0 - 15.0 (g/dL)    HCT 57.8 (*) 46.9 - 46.0 (%)    MCV 88.7  78.0 - 100.0 (fL)    MCH 29.7  26.0 - 34.0 (pg)    MCHC 33.5  30.0 - 36.0 (g/dL)    RDW 62.9  52.8 - 41.3 (%)    Platelets 299  150 - 400 (K/uL)   LIPASE, BLOOD     Status: Abnormal   Collection Time   07/06/11  6:05 AM      Component Value Range Comment   Lipase 92 (*) 11 - 59 (U/L)    Physical Exam General appearance: alert, cooperative, no distress and morbidly obese  Head: Normocephalic, without obvious abnormality, atraumatic  Resp: Normal WOB. CTAB.  Cardio: regular rate and rhythm, S1, S2 normal, no murmur, click, rub or gallop  GI: Soft. NT. No guarding. ND. No masses. BS+. 5 lap excisions clean, dry, intact. Extremities: extremities normal, atraumatic, no cyanosis or edema  Neurologic: Grossly normal   Patient condition at time of discharge/disposition: Stable  Disposition: Home   Follow up issues: 1. GI: S/p Cholecystectomy. Patient should follow-up with Gastroenterology to have enzymes  rechecked to ensure that patient does not have any retained common bile duct stone or sphincter of oddi dysfunction. 2. Metabolic Syndrome: Follow-up for cholesterol management and blood pressure recheck.  Discharge follow up:  Follow-up Information    Follow up with Rob Bunting, MD on 07/10/2011. (go to lab in basement, any time between 8 AM and 5 PM for labs )    Contact information:   [address] [place] [address] [phone]       Follow up with Ardyth Gal, MD on 07/16/2011. (1:30 PM)    Contact information:   [address] [place] [address] [phone]       Follow up with Ernestene Mention, MD on 07/31/2011. (10:30 AM)    Contact information:   [facility] [address] [place] [address] [phone]         Discharge Orders    Future Appointments: Provider: Department: Dept Phone: Center:   07/16/2011 1:30 PM Ardyth Gal, MD [facility] [phone] [facility]   07/31/2011 10:45 AM Ernestene Mention, MD [facility] [phone] None      Discharge Medications: Vicodin. 5-500 mg tabs 1 po q 4 hrs prn pain. 30 tabs. 0 refill   Beverly Milch, MS4 [facility] 07/06/2011 12:00 PM  Sofia Jaquith 1:42 PM 07/06/2011  R3 Addendum:  Patient seen and examined at the same time Beverly Milch. I agree with his above H+P.  Lungs:  Normal respiratory effort, chest expands symmetrically. Lungs are clear to auscultation, no crackles or wheezes. Heart - Regular rate and rhythm.  No murmurs, gallops or rubs.    Abdomen: soft and non-tender without masses, organomegaly or hernias noted.  No guarding or rebound. 5 well-closed Laparoscopic scars on abd.  Extremities:  No cyanosis, edema, or deformity  A/P  Surgery and GI notes reviewed. Ok to discharge home with surgery and GI follow ups.

## 2011-07-05 NOTE — Progress Notes (Signed)
I discussed with Dr Fara Boros.  I agree with their plans documented in their  Note for today.

## 2011-07-05 NOTE — Anesthesia Procedure Notes (Signed)
Procedure Name: Intubation Date/Time: 07/05/2011 2:07 PM Performed by: Malachi Pro Pre-anesthesia Checklist: Patient identified, Emergency Drugs available, Suction available, Patient being monitored and Timeout performed Patient Re-evaluated:Patient Re-evaluated prior to inductionOxygen Delivery Method: Circle System Utilized Preoxygenation: Pre-oxygenation with 100% oxygen Intubation Type: IV induction and Rapid sequence Laryngoscope Size: Mac and 3 Grade View: Grade II Tube type: Oral Tube size: 7.5 mm Number of attempts: 1 Airway Equipment and Method: patient positioned with wedge pillow and stylet Placement Confirmation: breath sounds checked- equal and bilateral,  positive ETCO2 and ETT inserted through vocal cords under direct vision Secured at: 23 cm Tube secured with: Tape Dental Injury: Teeth and Oropharynx as per pre-operative assessment

## 2011-07-05 NOTE — Transfer of Care (Signed)
Immediate Anesthesia Transfer of Care Note  Patient: Andrea Morton  Procedure(s) Performed:  LAPAROSCOPIC CHOLECYSTECTOMY WITH INTRAOPERATIVE CHOLANGIOGRAM  Patient Location: PACU  Anesthesia Type: General  Level of Consciousness: awake  Airway & Oxygen Therapy: Patient Spontanous Breathing  Post-op Assessment: Report given to PACU RN  Post vital signs: stable  Complications: No apparent anesthesia complications

## 2011-07-05 NOTE — Progress Notes (Signed)
   Patient ID: Andrea Morton, female   DOB: 1990-12-28, 21 y.o.   MRN: [id number]  Patient is alert. No nausea, minimal pain. Afebrile, VSS Abdomen obese, soft, mild upper abdominal tenderness  Labs just drawn   Assessment/Plan:Biliary pancreatitis, clinically this is rapidly resolving and without obvious complications Morbid obesity 2 months post partum  Will check labs and plan to proceed with laparoscopic cholecystectomy today.  Angelia Mould. Derrell Lolling, M.D., [facility] General and Minimally invasive Surgery Breast and Colorectal Surgery Office:   [phone] Pager:   [phone]

## 2011-07-05 NOTE — Anesthesia Preprocedure Evaluation (Signed)
Anesthesia Evaluation  Patient identified by MRN, date of birth, ID band Patient awake    Reviewed: Allergy & Precautions, H&P , NPO status , Patient's Chart, lab work & pertinent test results  Airway Mallampati: II TM Distance: >3 FB Neck ROM: Full    Dental  (+) Poor Dentition and Dental Advisory Given   Pulmonary neg pulmonary ROS,  clear to auscultation  Pulmonary exam normal       Cardiovascular hypertension, Pt. on medications Regular - Systolic murmurs    Neuro/Psych Negative Neurological ROS     GI/Hepatic Neg liver ROS,   Endo/Other  Morbid obesity  Renal/GU negative Renal ROS     Musculoskeletal   Abdominal   Peds  Hematology   Anesthesia Other Findings   Reproductive/Obstetrics                           Anesthesia Physical Anesthesia Plan  ASA: III  Anesthesia Plan: General   Post-op Pain Management:    Induction: Intravenous, Rapid sequence and Cricoid pressure planned  Airway Management Planned: Oral ETT  Additional Equipment:   Intra-op Plan:   Post-operative Plan: Extubation in OR  Informed Consent: I have reviewed the patients History and Physical, chart, labs and discussed the procedure including the risks, benefits and alternatives for the proposed anesthesia with the patient or authorized representative who has indicated his/her understanding and acceptance.   Dental advisory given  Plan Discussed with: CRNA, Anesthesiologist and Surgeon  Anesthesia Plan Comments:         Anesthesia Quick Evaluation

## 2011-07-05 NOTE — Progress Notes (Signed)
Subjective: AFVSS. Slept well overnight and tolerated clear liquid diet yesterday. Been NPO since midnight and discussed plan for cholecystectomy today. Minimal pain now. No BMs.   Objective: Vital signs in last 24 hours: Temp:  [97.3 F (36.3 C)-98.6 F (37 C)] 97.3 F (36.3 C) (01/31 0600) Pulse Rate:  [64-91] 83  (01/31 0600) Resp:  [16-18] 18  (01/31 0600) BP: (106-129)/(68-87) 116/68 mmHg (01/31 0600) SpO2:  [98 %-99 %] 99 % (01/31 0600) Weight change:  Last BM Date: 07/01/11  Intake/Output from previous day:   Intake/Output this shift:    General appearance: alert, cooperative, no distress and morbidly obese Head: Normocephalic, without obvious abnormality, atraumatic Resp: Normal WOB. CTAB. Cardio: regular rate and rhythm, S1, S2 normal, no murmur, click, rub or gallop GI: Soft. Mild tenderness epigastrium. No guarding. ND. No masses. BS+ Extremities: extremities normal, atraumatic, no cyanosis or edema Neurologic: Grossly normal  Lab Results:  Basename 07/05/11 0650 07/04/11 0435  WBC 7.9 11.0*  HGB 11.7* 11.9*  HCT 35.3* 36.0  PLT 290 316   BMET  Basename 07/04/11 0435 07/02/11 1513  NA 141 142  K 3.5 4.0  CL 106 107  CO2 23 22  GLUCOSE 87 105*  BUN 10 9  CREATININE 0.80 0.82  CALCIUM 9.1 9.7    Studies/Results: Dg Chest 1 View  07/04/2011  *RADIOLOGY REPORT*  Clinical Data: Preop gallbladder surgery.  CHEST - 1 VIEW  Comparison: None  Findings: Heart and mediastinal contours are within normal limits. No focal opacities or effusions.  No acute bony abnormality.  IMPRESSION: No active cardiopulmonary disease.  Original Report Authenticated By: Cyndie Chime, M.D.   US Abdomen Complete  07/03/2011  *RADIOLOGY REPORT*  Clinical Data:  Pancreatitis.  COMPLETE ABDOMINAL ULTRASOUND 07/03/2011:  Comparison:  None.  Findings:  Gallbladder:  Numerous small shadowing gallstones, the largest on the order of approximately 10 mm.  No gallbladder wall thickening or  pericholecystic fluid.  Negative sonographic Murphy's sign according to the ultrasound technologist.  Common bile duct:  Upper normal in caliber to perhaps minimally dilated with maximum diameter approximating 8 mm.  No visible bile duct stones.  Liver:  Normal size and echotexture without focal parenchymal abnormality.  Patent portal vein with hepatopetal flow.  IVC:  Patent.  Pancreas:  Although the pancreas is difficult to visualize in its entirety, no focal pancreatic abnormality is identified.  The tail was obscured by overlying bowel gas.  Spleen:  Normal size and echotexture without focal parenchymal abnormality.  Right Kidney:  No hydronephrosis.  Well-preserved cortex.  No shadowing calculi.  Normal size and parenchymal echotexture without focal abnormalities.  Approximately 13.6 cm in length.  Left Kidney:  No hydronephrosis.  Well-preserved cortex.  No shadowing calculi.  Normal size and parenchymal echotexture without focal abnormalities.  Approximately 13.3 cm in length.  Abdominal aorta:  Normal in caliber in its proximal and mid portions; obscured distally by overlying bowel gas.  IMPRESSION:  1.  Cholelithiasis without sonographic evidence of acute cholecystitis. 2.  Upper normal caliber to slightly dilated common bile duct measuring 8 mm.  No visible bile duct stones. 3.  Otherwise normal examination with a caveat that the pancreatic tail and the distal abdominal aorta were obscured by overlying bowel gas and were therefore not evaluated.  Original Report Authenticated By: Arnell Sieving, M.D.    Medications: I have reviewed the patient's current medications.  Assessment/Plan: 21 y/o AAF with resolving acute pancreatitis, most likely gallstone pancreatitis from passed  stone.   1. GI: Epigastric abdominal pain likely Gallstone Pancreatitis given U/S showing cholelithiasis without cholecystitis or choledocholithiasis. Stone likely passed.  - Bili, alk phos and lipase all trending down.  Normal WBC and no fever.  - Continue IVF NS @ 125 cc/hr, NPO  - 4 mg Morphine IV q 2 hours PRN  [facility] consulted and will do cholecystectomy today. Thank you for assistance.  2.Hypertension: (gestational)  - Hydralazine 20 mg q 6 hrs PRN sys>150  - Hold home Labetalol   3. S/p C- section- 2 months ago   4. Endocrine: Hyperlipidemia  - Recommend outpatient follow-up to consider statin.   4. Prophylaxis: Heparin, protonix.  5. Disposition: Pending surgery consult and clinical improvement.    LOS: 2 days   Beverly Milch [facility] 07/05/2011, 8:44 AM    PGY-2 ADDENDUM I have seen and examined this patient and agree with the history, physical, and A/P as outlined above by [name].  Briefly, Andrea Morton is feeling well this morning, no complaints of pain or discomfort.  Pt and family asking questions about the importance of the gallbladder and any special dietary changes that will be required.  All questions were answered.   O: Filed Vitals:   07/05/11 0600  BP: 116/68  Pulse: 83  Temp: 97.3 F (36.3 C)  Resp: 18    General: alert, cooperative, no distress and morbidly obese Resp: Normal WOB. CTAB, no wheezes, rales, crackles. Cardio: RRR, no murmur GI: soft, non-tender, non-distended, obese, +BS Extremities: extremities normal, atraumatic, no edema  Labs: as above  A/P: 21 yo F p/w gallstone pancreatitis, now with improved pain likely 2/2 passing gallstone  1. Gallstone pancreatitis: -Pt NPO for lab chole today by surgery -Tolerated clears yesterday, will resume post-op with advancement in diet per surgery -Leukocytosis resolved, alk phos, lipase, bili trended down yesterday -Cont IVF while NPO  2. Gestational HTN: -Holding home labetolol -Pt now 2 months s/p C/S -Hydralazine ordered PRN, although none has been needed during hospitalization -Pt likely no longer hypertensive and could probably be d/c'ed home w/o BP meds with recheck as outpatient  3.  HLD: -Patient with elevated chol (210), LDL (152) -Consider addition of statin as outpt by PCP  4. FEN/GI: NPO for surgery, IVF  5. Ppx: protonix, SQ hep  6. Dispo: to OR today at 1400, watch O/N, d/c pending surgery rec's (tomorrow vs Sat most likely)  Andrea Morton 07/05/2011 9:57 AM

## 2011-07-05 NOTE — Op Note (Signed)
Patient Name:           Andrea Morton   Date of Surgery:        07/05/2011  Pre op Diagnosis:      Gallstones, biliary pancreatitis  Post op Diagnosis:    Gallstones, biliary pancreatitis, possible retained common duct stone  Procedure:                 Laparoscopic cholecystectomy with intraoperative cholangiogram  Surgeon:                     Angelia Mould. Derrell Lolling, M.D., FACS  Assistant:                      Harriette Bouillon,  M.D., [facility]  Operative Indications:  This is a 21 year old Afro-American female with morbid obesity, 2 months postpartum. She presented to the hospital with abdominal pain, nausea, vomiting and was found to have a markedly elevated lipase and mildly elevated liver function tests. Her lab work normalized rapidly over 48 hours and her abdominal tenderness resolved. Ultrasound shows gallstones. She was offered cholecystectomy at this admission and that was her desire. She is brought to the operating room electively  Operative Findings:       The gallbladder was thin-walled and contained clear bile. There was a stone impacted in the cystic duct. The cholangiogram showed what appeared to be a small filling defect at the very distal common duct, and the contrast did not go into the duodenum. Otherwise intrahepatic and extrahepatic biliary anatomy was normal. The liver was large but soft. The stomach, duodenum, small intestine, and large intestine were grossly normal to inspection. She has adhesions the lower abdomen from her recent C-section.  Procedure in Detail:          Following the induction of general endotracheal anesthesia the abdomen was prepped and draped in a sterile fashion. Intravenous antibiotics were given. Surgical time out was performed. 0.5% Marcaine with epinephrine was used as a local infiltration anesthetic. A vertically oriented incision was made at the upper rim of the umbilicus. The fascia was incised in the midline and the abdominal cavity entered under  direct vision. An 11 mm Hassan trocar was inserted and secured with pursestring suture of 0 Vicryl. Pneumoperitoneum was created and  video camera inserted. An 11 mm trocar was placed in subxiphoid region and two 5 mm trocars placed in the right upper quadrant. The gallbladder was elevated. We dissected out the cystic duct and cystic artery. The cystic artery was isolated, controlled with hemoclips and divided. This created a large window behind the cystic duct. The cystic duct was incised and  a small stone was extracted. A cholangiogram catheter was inserted. A cholangiogram was obtained using the C-arm. The dye flowed easily into the biliary tree but there appeared to be a small filling defect in the distal common duct and after 2 separate imaging runs, I did not see any contrast going to the duodenum. Otherwise the biliary anatomy was normal. The cholangiocatheter was removed, the cystic duct and secured with metal clips and divided. The gallbladder was dissected from its bed with electrocautery, placed in a specimen bag and removed. We did make a small hole in the gallbladder and spilled a little bile but no stones. We irrigated the subhepatic and subphrenic spaces extensively until the irrigation fluid was completely clear. At the completion of the case we saw no bleeding from the gallbladder bed and no bile  leak anywhere under the liver. The trocars were removed. The pneumoperitoneum released. The fascia at the umbilicus closed with 0 Vicryl sutures. The skin incisions were closed with subcuticular sutures of 4-0 Monocryl and Dermabond. The patient was taken to the recovery room in stable condition. There were no complications. Counts were correct. EBL 10 cc.     Angelia Mould. Derrell Lolling, M.D., FACS General and Minimally Invasive Surgery Breast and Colorectal Surgery  07/05/2011 3:25 PM

## 2011-07-05 NOTE — Anesthesia Postprocedure Evaluation (Signed)
Anesthesia Post Note  Patient: Andrea Morton  Procedure(s) Performed:  LAPAROSCOPIC CHOLECYSTECTOMY WITH INTRAOPERATIVE CHOLANGIOGRAM  Anesthesia type: general  Patient location: PACU  Post pain: Pain level controlled  Post assessment: Patient's Cardiovascular Status Stable  Last Vitals:  Filed Vitals:   07/05/11 1622  BP:   Pulse: 78  Temp: 36.3 C  Resp: 18    Post vital signs: Reviewed and stable  Level of consciousness: sedated  Complications: No apparent anesthesia complications

## 2011-07-05 NOTE — Consult Note (Signed)
[facility] Consult: 4:26 PM 07/05/2011   Referring Provider: Derrell Lolling Primary Care Physician:  Ardyth Gal, MD, MD Primary Gastroenterologist: none.  Unassigned.   Reason for Consultation:    HPI: Andrea MCRAY is a 21 y.o. female. Patient is a 21 y/o morbidly obese AAF s/p c-section at 32 weeks, 2 months ago. c section necessitated due to severe preeclampsia. Admitted yesterday with three days of epigastric pain 9/10 and loss of appetite, non bloody vomiting. . No bowel movements for two days. No fevers.  No history of this kind of pain before.  Lipase > 2000 3 days ago, normalized within 2 days.   AST/ALT 111/76, but note they were 92/49 Nov 29th, 2012.  Alk Phos 128, got as high as 144 in Nov 2013.  On US abdomen :  Gallstones, 8mm CBD. No CBD stones or changes of acute cholecystitis.  LFTs normalized today.   Now s/p lap chole with IOC today. In op report Dr Derrell Lolling notes "the dye flowed easily into the biliary tree but there appeared to be a small filling defect in the distal common duct and after 2 separate imaging runs, I did not see any contrast going to the duodenum. Otherwise the biliary anatomy was normal". He requests GI consult in case patient needs ERCP  No alcohol consumption. No history of biliary disease known.    Pt currently in PACU and states she is sore on right abdomen.   Past Medical History  Diagnosis Date  . Herpes, genital   . Hypertension   . Pregnancy induced hypertension   . Obesity   . Preeclampsia, severe 2012    lead to C section.    Past Surgical History         . Cesarean section 05/03/2011    Procedure: CESAREAN SECTION;  Surgeon: Janine Limbo, MD;  Location: [facility];  Service: Gynecology;  Laterality: N/A;    Prior to Admission medications   Medication Sig Start Date End Date Taking? Authorizing Provider  NIFEdipine (PROCARDIA XL/ADALAT-CC) 90 MG 24 hr tablet Take 90 mg by mouth at  bedtime.   Yes Historical Provider, MD  ranitidine (ZANTAC) 150 MG tablet Take 1 tablet (150 mg total) by mouth 2 (two) times daily. 07/02/11 07/01/12 Yes Ellin Mayhew, MD  valACYclovir (VALTREX) 500 MG tablet Take 500 mg by mouth 2 (two) times daily as needed. for outbreaks 06/21/11  Yes Ardyth Gal, MD  medroxyPROGESTERone (DEPO-PROVERA) 150 MG/ML injection Inject 1 mL (150 mg total) into the muscle every 3 (three) months. 05/06/11   Hal Morales, MD    Scheduled Meds:    .  ceFAZolin (ANCEF) IV  2 g Intravenous 60 min Pre-Op  . chlorhexidine  1 application Topical Once  . heparin  5,000 Units Subcutaneous Once  . pantoprazole (PROTONIX) IV  40 mg Intravenous QHS   Infusions:    . sodium chloride 125 mL/hr at 07/04/11 1335  . lactated ringers 50 mL/hr at 07/05/11 1318   PRN Meds: droperidol, hydrALAZINE, HYDROmorphone, morphine, morphine injection, ondansetron (ZOFRAN) IV, ondansetron, DISCONTD: 0.9 % irrigation (POUR BTL), DISCONTD: bupivacaine-EPINEPHrine, DISCONTD: iohexol, DISCONTD: sodium chloride irrigation, DISCONTD: sodium chloride irrigation   Allergies as of 07/03/2011  . (No Known Allergies)    Family History  Problem Relation Age of Onset  . Diabetes Mother   . Obesity Mother   . Asthma Sister   . Obesity Sister   . Diabetes Maternal Aunt   . Obesity Maternal Aunt   . Diabetes Maternal  Grandmother   . Anesthesia problems Neg Hx     History   Social History  . Marital Status: Single    Spouse Name: N/A    Number of Children: N/A  . Years of Education: N/A   Occupational History  . Not on file.   Social History Main Topics  . Smoking status: Former Smoker -- 2 years    Types: Cigarettes    Quit date: 10/13/2010  . Smokeless tobacco: Never Used  . Alcohol Use: No  . Drug Use: No  . Sexually Active: Yes    Birth Control/ Protection: Injection   Other Topics Concern  . Not on file   Social History Narrative   Unemployed, finished high  school.1 child- 2 months old. No exercise. Doesn't eat a healthy diet.     REVIEW OF SYSTEMS: Constitutional:  Lost only 20# weight immediately after childbirth, nothing since.  No exercise ENT:  No nose bleeds or colds Pulm:  No cough.  Limited exercise capacity.  Winded with stair climbing CV:  No pals or CP GU:  No dysuria or frequency GI:  As above. Heme:  No bleeding disorders.  .    Transfusions:  None.  Neuro:  Headaches. Derm:  Several tatoos.  No rash Endocrine:  Hgb A1c was elevated during Nov 2012. Immunization:  04/08/12 flu shot. Travel[place]  [place]   PHYSICAL EXAM: Vital signs in last 24 hours: Temp:  [97.3 F (36.3 C)-99.5 F (37.5 C)] 99.5 F (37.5 C) (01/31 1530) Pulse Rate:  [73-91] 73  (01/31 1553) Resp:  [18-24] 22  (01/31 1553) BP: (116-153)/(68-87) 153/75 mmHg (01/31 1549) SpO2:  [98 %-100 %] 98 % (01/31 1553)  General: Morbidly obese AAF.  Awake in recovery area Head:  obese  Eyes:  No icterus or conj pallor Ears:  Not HOH  Nose:  No discharge Mouth:  Pink, clear MM Neck:  obese Lungs:  Clear bilaterally.  No cough Heart: RRR, no MRG Abdomen:  Soft, obese, hypoactive BS,  Tender on right.  Glue on small surgical incisions.   Rectal: deferred   Musc/Skeltl: no obvious deformity Extremities:  No pitting edema  Neurologic:  Oriented x 3.  Moves all 4s.  No tremor Skin:  No rash or sores Tattoos:  Across sternum, on left arm and leg Nodes:  None at neck   Psych:  Pleasant, cooperative.  Intake/Output from previous day:   Intake/Output this shift: Total I/O In: 1000 [I.V.:1000] Out: -   LAB RESULTS:  Basename 07/05/11 0650 07/04/11 0435  WBC 7.9 11.0*  HGB 11.7* 11.9*  HCT 35.3* 36.0  PLT 290 316   BMET Lab Results  Component Value Date   NA 141 07/05/2011   NA 141 07/04/2011   NA 142 07/02/2011   K 4.0 07/05/2011   K 3.5 07/04/2011   K 4.0 07/02/2011   CL 107 07/05/2011   CL 106 07/04/2011   CL 107 07/02/2011   CO2  24 07/05/2011   CO2 23 07/04/2011   CO2 22 07/02/2011   GLUCOSE 86 07/05/2011   GLUCOSE 87 07/04/2011   GLUCOSE 105* 07/02/2011   BUN 6 07/05/2011   BUN 10 07/04/2011   BUN 9 07/02/2011   CREATININE 0.78 07/05/2011   CREATININE 0.80 07/04/2011   CREATININE 0.82 07/02/2011   CALCIUM 9.1 07/05/2011   CALCIUM 9.1 07/04/2011   CALCIUM 9.7 07/02/2011   LFT Lab Results  Component Value Date   PROT 6.8 07/05/2011  PROT 7.0 07/04/2011   PROT 7.2 07/02/2011   ALBUMIN 3.1* 07/05/2011   ALBUMIN 3.3* 07/04/2011   ALBUMIN 4.3 07/02/2011   AST 15 07/05/2011   AST 18 07/04/2011   AST 111* 07/02/2011   ALT 30 07/05/2011   ALT 42* 07/04/2011   ALT 76* 07/02/2011   ALKPHOS 99 07/05/2011   ALKPHOS 118* 07/04/2011   ALKPHOS 128* 07/02/2011   BILITOT 0.3 07/05/2011   BILITOT 0.5 07/04/2011   BILITOT 2.0* 07/02/2011   PT/INR Lab Results  Component Value Date   INR 1.11 07/04/2011   RADIOLOGY STUDIES: Dg Chest 1 View  07/04/2011  *RADIOLOGY REPORT*  Clinical Data: Preop gallbladder surgery.  CHEST - 1 VIEW  Comparison: None  Findings: Heart and mediastinal contours are within normal limits. No focal opacities or effusions.  No acute bony abnormality.  IMPRESSION: No active cardiopulmonary disease.  Original Report Authenticated By: Cyndie Chime, M.D.   Dg Cholangiogram Operative  07/05/2011  *RADIOLOGY REPORT*  Clinical Data:   Laparoscopic cholecystectomy.  INTRAOPERATIVE CHOLANGIOGRAM  Technique:  Cholangiographic images from the C-arm fluoroscopic device were submitted for interpretation post-operatively.  Please see the procedural report for the amount of contrast and the fluoroscopy time utilized.  Comparison:  Abdominal ultrasound 07/03/2011.  Findings:  Injection of the cystic duct remnant demonstrates mild prominence of the common hepatic and bile ducts.  No discrete intraluminal filling defects are identified.  However, there is no drainage into the duodenum.  There is no extravasation.  The visualized  intrahepatic biliary system appears normal.  IMPRESSION: Nonvisualization of the drainage into the duodenum could be secondary to spasm of the sphincter of Oddi or a retained calculus in the distal common bile duct.  Correlate clinically.  Original Report Authenticated By: Gerrianne Scale, M.D.    ENDOSCOPIC STUDIES: never  IMPRESSION: 1.  S/p lap chole 2.  Biliary pancreatitis 3.  05/03/11 c-section, precipitated by severe preeclampsia.  4.  Prior hx LFT abnormality, possibly related to preeclampsia, had resolved to normal. 5.  Morbid Obesity.   PLAN: 1.  Repeat LFTs in AM.  Keep NPO after midnight and assess pt in AM, decide then if she needs  ERCP.  Case was d/w Dr Christella Hartigan.    LOS: 2 days   Jennye Moccasin  07/05/2011, 4:26 PM Pager: [phone]      ________________________________________________________________________  [facility] MD note:  I spoke with Dr. Derrell Lolling reviewed the IOC images, labs and  agree with the assessment and plan described above.   Rob Bunting, MD [facility] Pager [phone]

## 2011-07-06 ENCOUNTER — Other Ambulatory Visit: Payer: Self-pay

## 2011-07-06 ENCOUNTER — Encounter (HOSPITAL_COMMUNITY): Payer: Self-pay | Admitting: General Surgery

## 2011-07-06 DIAGNOSIS — R945 Abnormal results of liver function studies: Secondary | ICD-10-CM

## 2011-07-06 DIAGNOSIS — K859 Acute pancreatitis without necrosis or infection, unspecified: Principal | ICD-10-CM

## 2011-07-06 DIAGNOSIS — R932 Abnormal findings on diagnostic imaging of liver and biliary tract: Secondary | ICD-10-CM

## 2011-07-06 DIAGNOSIS — R109 Unspecified abdominal pain: Secondary | ICD-10-CM

## 2011-07-06 LAB — CBC
HCT: 34.6 % — ABNORMAL LOW (ref 36.0–46.0)
Hemoglobin: 11.6 g/dL — ABNORMAL LOW (ref 12.0–15.0)
MCH: 29.7 pg (ref 26.0–34.0)
MCHC: 33.5 g/dL (ref 30.0–36.0)
RDW: 14.5 % (ref 11.5–15.5)

## 2011-07-06 LAB — COMPREHENSIVE METABOLIC PANEL
ALT: 166 U/L — ABNORMAL HIGH (ref 0–35)
AST: 210 U/L — ABNORMAL HIGH (ref 0–37)
Alkaline Phosphatase: 180 U/L — ABNORMAL HIGH (ref 39–117)
CO2: 24 mEq/L (ref 19–32)
Calcium: 9 mg/dL (ref 8.4–10.5)
GFR calc Af Amer: >90 mL/min (ref >90)
GFR calc non Af Amer: >90 mL/min (ref >90)
Glucose, Bld: 111 mg/dL — ABNORMAL HIGH (ref 70–99)
Potassium: 3.7 mEq/L (ref 3.5–5.1)
Sodium: 140 mEq/L (ref 135–145)

## 2011-07-06 LAB — LIPASE, BLOOD: Lipase: 92 U/L — ABNORMAL HIGH (ref 11–59)

## 2011-07-06 MED ORDER — HYDROCODONE-ACETAMINOPHEN 5-500 MG PO TABS: 1.0000 | ORAL_TABLET | Freq: Four times a day (QID) | ORAL | Status: DC | PRN

## 2011-07-06 MED FILL — Hydromorphone HCl Inj 1 MG/ML: INTRAMUSCULAR | Qty: 1 | Status: AC

## 2011-07-06 NOTE — Progress Notes (Signed)
[facility] Daily Rounding Note 07/06/2011, 8:49 AM  SUBJECTIVE: She has minimal abdominal pain, not using a lot of narcotics. No nausea.  Hungry  OBJECTIVE: General: Looks well.  NAD  Vital signs in last 24 hours: Temp:  [97.4 F (36.3 C)-99.5 F (37.5 C)] 98 F (36.7 C) (02/01 0634) Pulse Rate:  [67-80] 67  (02/01 0634) Resp:  [18-24] 18  (02/01 0634) BP: (132-153)/(65-84) 132/65 mmHg (02/01 0634) SpO2:  [96 %-100 %] 99 % (02/01 0634) Last BM Date: 07/01/11  Heart: RRR Chest: clear  Abdomen: soft slight tenderness, upper abdomen B.  Extremities: no edema Neuro/Psych:  Pleasant, cooperative.   Intake/Output from previous day: 01/31 0701 - 02/01 0700 In: 2914 [P.O.:562; I.V.:2250; IV Piggyback:102] Out: -   Intake/Output this shift:    Lab Results:  Basename 07/06/11 0605 07/05/11 1735 07/05/11 0650  WBC 6.6 12.0* 7.9  HGB 11.6* 11.8* 11.7*  HCT 34.6* 35.3* 35.3*  PLT 299 283 290   BMET  Basename 07/06/11 0605 07/05/11 1735 07/05/11 0835 07/04/11 0435  NA 140 -- 141 141  K 3.7 -- 4.0 3.5  CL 106 -- 107 106  CO2 24 -- 24 23  GLUCOSE 111* -- 86 87  BUN 4* -- 6 10  CREATININE 0.79 0.77 0.78 --  CALCIUM 9.0 -- 9.1 9.1   LFT  Basename 07/06/11 0605  PROT 6.7  ALBUMIN 3.0*  AST 210*  ALT 166*  ALKPHOS 180*  BILITOT 1.1  BILIDIR --  IBILI --   PT/INR  Basename 07/04/11 0853  LABPROT 14.5  INR 1.11   Studies/Results: Dg Chest 1 View  07/04/2011  *RADIOLOGY REPORT*  Clinical Data: Preop gallbladder surgery.  CHEST - 1 VIEW  Comparison: None  Findings: Heart and mediastinal contours are within normal limits. No focal opacities or effusions.  No acute bony abnormality.  IMPRESSION: No active cardiopulmonary disease.  Original Report Authenticated By: Cyndie Chime, M.D.   Dg Cholangiogram Operative  07/05/2011  *RADIOLOGY REPORT*  Clinical Data:   Laparoscopic cholecystectomy.  INTRAOPERATIVE CHOLANGIOGRAM  Technique:  Cholangiographic images from  the C-arm fluoroscopic device were submitted for interpretation post-operatively.  Please see the procedural report for the amount of contrast and the fluoroscopy time utilized.  Comparison:  Abdominal ultrasound 07/03/2011.  Findings:  Injection of the cystic duct remnant demonstrates mild prominence of the common hepatic and bile ducts.  No discrete intraluminal filling defects are identified.  However, there is no drainage into the duodenum.  There is no extravasation.  The visualized intrahepatic biliary system appears normal.  IMPRESSION: Nonvisualization of the drainage into the duodenum could be secondary to spasm of the sphincter of Oddi or a retained calculus in the distal common bile duct.  Correlate clinically.  Original Report Authenticated By: Gerrianne Scale, M.D.    ASSESMENT: 1. S/p lap chole.  ? CBD stone on IOC?  LFTs are up.  Clinically pt in no distress.  D/W Dr Christella Hartigan who thinks this could be transient post up finding due to edema.  Wants to get LFTs next week, if they remain elevated, he will orchestrate an out pt ERCP.   2. Biliary pancreatitis.     3. 05/03/11 c-section, precipitated by severe preeclampsia.  4. Prior hx LFT abnormality, possibly related to preeclampsia, had resolved to normal.  5. Morbid Obesity.   PLAN: 1. Labs , our office, next Tuesday.   2.  Ordered low fat diet.   3.  D/C pepcid and  IV protonix (on both)  Was on no PPI PTA.  LOS: 3 days   Jennye Moccasin  07/06/2011, 8:49 AM Pager: [phone]   ________________________________________________________________________  [facility] MD note:  I personally examined the patient, reviewed the data and agree with the assessment and plan described above.  LFTs did bump after sugery.  She feels very well post op.  IOC shows non-empyting CBD but not a clear stone.  Given recent pancreatitis, morbid obesity, I would not proceed with ERCP at this point. She will get LFTs checked at my office early next week and if  rising or not normalize then we'll reconsider ERCP.  OK to d/c home for now.   Rob Bunting, MD [facility] [facility] Pager [phone]

## 2011-07-06 NOTE — Discharge Summary (Signed)
I have seen and examined this patient. I have discussed with Dr Orton.  I agree with their findings and plans as documented in their discharge note.  

## 2011-07-06 NOTE — Progress Notes (Signed)
1 Day Post-Op  Subjective: Alert. Comfortable. No nausea. Morning labs pending. Dr. Christella Hartigan has seen about possible retained CBD stone  Objective: Vital signs in last 24 hours: Temp:  [97.4 F (36.3 C)-99.5 F (37.5 C)] 98.2 F (36.8 C) (01/31 2135) Pulse Rate:  [73-80] 79  (01/31 2135) Resp:  [18-24] 18  (01/31 2135) BP: (140-153)/(67-84) 140/67 mmHg (01/31 2135) SpO2:  [96 %-100 %] 97 % (01/31 2135) Last BM Date: 07/01/11  Intake/Output from previous day: 01/31 0701 - 02/01 0700 In: 2914 [P.O.:562; I.V.:2250; IV Piggyback:102] Out: -  Intake/Output this shift: Total I/O In: 1914 [P.O.:562; I.V.:1250; IV Piggyback:102] Out: -   General appearance: alert GI: obese. soft. nontender, wounds look fine, benign exam  Lab Results:  Results for orders placed during the hospital encounter of 07/03/11 (from the past 24 hour(s))  CBC     Status: Abnormal   Collection Time   07/05/11  6:50 AM      Component Value Range   WBC 7.9  4.0 - 10.5 (K/uL)   RBC 3.93  3.87 - 5.11 (MIL/uL)   Hemoglobin 11.7 (*) 12.0 - 15.0 (g/dL)   HCT 16.1 (*) 09.6 - 46.0 (%)   MCV 89.8  78.0 - 100.0 (fL)   MCH 29.8  26.0 - 34.0 (pg)   MCHC 33.1  30.0 - 36.0 (g/dL)   RDW 04.5  40.9 - 81.1 (%)   Platelets 290  150 - 400 (K/uL)  COMPREHENSIVE METABOLIC PANEL     Status: Abnormal   Collection Time   07/05/11  8:35 AM      Component Value Range   Sodium 141  135 - 145 (mEq/L)   Potassium 4.0  3.5 - 5.1 (mEq/L)   Chloride 107  96 - 112 (mEq/L)   CO2 24  19 - 32 (mEq/L)   Glucose, Bld 86  70 - 99 (mg/dL)   BUN 6  6 - 23 (mg/dL)   Creatinine, Ser 9.14  0.50 - 1.10 (mg/dL)   Calcium 9.1  8.4 - 78.2 (mg/dL)   Total Protein 6.8  6.0 - 8.3 (g/dL)   Albumin 3.1 (*) 3.5 - 5.2 (g/dL)   AST 15  0 - 37 (U/L)   ALT 30  0 - 35 (U/L)   Alkaline Phosphatase 99  39 - 117 (U/L)   Total Bilirubin 0.3  0.3 - 1.2 (mg/dL)   GFR calc non Af Amer >90  >90 (mL/min)   GFR calc Af Amer >90  >90 (mL/min)  LIPASE, BLOOD      Status: Normal   Collection Time   07/05/11  8:35 AM      Component Value Range   Lipase 18  11 - 59 (U/L)  CBC     Status: Abnormal   Collection Time   07/05/11  5:35 PM      Component Value Range   WBC 12.0 (*) 4.0 - 10.5 (K/uL)   RBC 3.93  3.87 - 5.11 (MIL/uL)   Hemoglobin 11.8 (*) 12.0 - 15.0 (g/dL)   HCT 95.6 (*) 21.3 - 46.0 (%)   MCV 89.8  78.0 - 100.0 (fL)   MCH 30.0  26.0 - 34.0 (pg)   MCHC 33.4  30.0 - 36.0 (g/dL)   RDW 08.6  57.8 - 46.9 (%)   Platelets 283  150 - 400 (K/uL)  CREATININE, SERUM     Status: Normal   Collection Time   07/05/11  5:35 PM      Component Value  Range   Creatinine, Ser 0.77  0.50 - 1.10 (mg/dL)   GFR calc non Af Amer >90  >90 (mL/min)   GFR calc Af Amer >90  >90 (mL/min)     Studies/Results: @[id number] @     .  ceFAZolin (ANCEF) IV  2 g Intravenous 60 min Pre-Op  .  ceFAZolin (ANCEF) IV  2 g Intravenous Q8H  . famotidine  20 mg Oral Daily  . heparin  5,000 Units Subcutaneous Q8H  . NIFEdipine  90 mg Oral QHS  . pantoprazole (PROTONIX) IV  40 mg Intravenous QHS  . DISCONTD: chlorhexidine  1 application Topical Once  . DISCONTD: heparin  5,000 Units Subcutaneous Once  . DISCONTD: medroxyPROGESTERone  150 mg Intramuscular Q90 days     Assessment/Plan: s/p Procedure(s): LAPAROSCOPIC CHOLECYSTECTOMY WITH INTRAOPERATIVE CHOLANGIOGRAM  POD#1.Marland KitchenMarland KitchenUneventful post op course so far. Biliary pancreatitis also seems to be resolving. She is informed about possible retained CBD stone, and possible need for ERCP  Keep NPO, continue Ancef antibiotic, await lab results and GI follow up.    LOS: 3 days    Arlyn Buerkle M. Derrell Lolling, M.D., [facility] General and Minimally invasive Surgery Breast and Colorectal Surgery Office:   [phone] Pager:   [phone]  07/06/2011  . .prob

## 2011-07-08 ENCOUNTER — Encounter (HOSPITAL_COMMUNITY): Payer: Self-pay

## 2011-07-08 ENCOUNTER — Observation Stay (HOSPITAL_COMMUNITY)
Admission: EM | Admit: 2011-07-08 | Discharge: 2011-07-10 | Disposition: A | Discharge status: Home / Self Care | Payer: Medicaid Other | Attending: General Surgery | Admitting: General Surgery

## 2011-07-08 ENCOUNTER — Emergency Department (HOSPITAL_COMMUNITY): Payer: Medicaid Other

## 2011-07-08 DIAGNOSIS — K8050 Calculus of bile duct without cholangitis or cholecystitis without obstruction: Secondary | ICD-10-CM | POA: Diagnosis present

## 2011-07-08 DIAGNOSIS — K5900 Constipation, unspecified: Secondary | ICD-10-CM | POA: Insufficient documentation

## 2011-07-08 DIAGNOSIS — F17200 Nicotine dependence, unspecified, uncomplicated: Secondary | ICD-10-CM | POA: Insufficient documentation

## 2011-07-08 DIAGNOSIS — R109 Unspecified abdominal pain: Secondary | ICD-10-CM

## 2011-07-08 DIAGNOSIS — A6000 Herpesviral infection of urogenital system, unspecified: Secondary | ICD-10-CM

## 2011-07-08 DIAGNOSIS — I10 Essential (primary) hypertension: Secondary | ICD-10-CM | POA: Insufficient documentation

## 2011-07-08 DIAGNOSIS — K859 Acute pancreatitis without necrosis or infection, unspecified: Principal | ICD-10-CM | POA: Insufficient documentation

## 2011-07-08 DIAGNOSIS — K851 Biliary acute pancreatitis without necrosis or infection: Secondary | ICD-10-CM | POA: Diagnosis present

## 2011-07-08 DIAGNOSIS — R932 Abnormal findings on diagnostic imaging of liver and biliary tract: Secondary | ICD-10-CM

## 2011-07-08 DIAGNOSIS — E669 Obesity, unspecified: Secondary | ICD-10-CM | POA: Insufficient documentation

## 2011-07-08 LAB — URINALYSIS, ROUTINE W REFLEX MICROSCOPIC
Glucose, UA: NEGATIVE mg/dL
Ketones, ur: NEGATIVE mg/dL
Nitrite: NEGATIVE
Protein, ur: NEGATIVE mg/dL
Specific Gravity, Urine: 1.007 (ref 1.005–1.030)
Urobilinogen, UA: 2 mg/dL — ABNORMAL HIGH (ref 0.0–1.0)
pH: 6.5 (ref 5.0–8.0)

## 2011-07-08 LAB — DIFFERENTIAL
Basophils Relative: 0 % (ref 0–1)
Eosinophils Absolute: 0.3 10*3/uL (ref 0.0–0.7)
Eosinophils Relative: 3 % (ref 0–5)
Lymphs Abs: 1.4 10*3/uL (ref 0.7–4.0)
Neutrophils Relative %: 77 % (ref 43–77)

## 2011-07-08 LAB — COMPREHENSIVE METABOLIC PANEL
ALT: 347 U/L — ABNORMAL HIGH (ref 0–35)
AST: 233 U/L — ABNORMAL HIGH (ref 0–37)
CO2: 25 mEq/L (ref 19–32)
Calcium: 9.7 mg/dL (ref 8.4–10.5)
Creatinine, Ser: 0.79 mg/dL (ref 0.50–1.10)
GFR calc Af Amer: >90 mL/min (ref >90)
GFR calc non Af Amer: >90 mL/min (ref >90)
Sodium: 141 mEq/L (ref 135–145)
Total Protein: 7.2 g/dL (ref 6.0–8.3)

## 2011-07-08 LAB — URINE MICROSCOPIC-ADD ON

## 2011-07-08 LAB — CBC
HCT: 36.9 % (ref 36.0–46.0)
Hemoglobin: 12.3 g/dL (ref 12.0–15.0)
MCH: 29.8 pg (ref 26.0–34.0)
MCHC: 33.3 g/dL (ref 30.0–36.0)
MCV: 89.3 fL (ref 78.0–100.0)
Platelets: 319 10*3/uL (ref 150–400)
RBC: 4.13 MIL/uL (ref 3.87–5.11)
RDW: 14.7 % (ref 11.5–15.5)
WBC: 10.1 10*3/uL (ref 4.0–10.5)

## 2011-07-08 LAB — PREGNANCY, URINE: Preg Test, Ur: NEGATIVE

## 2011-07-08 LAB — LIPASE, BLOOD: Lipase: >3000 U/L — ABNORMAL HIGH (ref 11–59)

## 2011-07-08 MED ORDER — CIPROFLOXACIN IN D5W 400 MG/200ML IV SOLN
INTRAVENOUS | Status: AC
  Administered 2011-07-08: 400 mg via INTRAVENOUS
  Filled 2011-07-08: qty 200

## 2011-07-08 MED ORDER — CHLORHEXIDINE GLUCONATE 0.12 % MT SOLN
15.0000 mL | Freq: Two times a day (BID) | OROMUCOSAL | Status: DC
  Administered 2011-07-08 – 2011-07-10 (×3): 15 mL via OROMUCOSAL
  Filled 2011-07-08 (×3): qty 15

## 2011-07-08 MED ORDER — PANTOPRAZOLE SODIUM 40 MG IV SOLR
40.0000 mg | Freq: Every day | INTRAVENOUS | Status: DC
  Administered 2011-07-08 – 2011-07-09 (×2): 40 mg via INTRAVENOUS
  Filled 2011-07-08 (×3): qty 40

## 2011-07-08 MED ORDER — FAMOTIDINE 20 MG PO TABS
20.0000 mg | ORAL_TABLET | Freq: Every day | ORAL | Status: DC
  Administered 2011-07-08 – 2011-07-10 (×3): 20 mg via ORAL
  Filled 2011-07-08 (×4): qty 1

## 2011-07-08 MED ORDER — CIPROFLOXACIN IN D5W 400 MG/200ML IV SOLN
400.0000 mg | Freq: Two times a day (BID) | INTRAVENOUS | Status: DC
  Administered 2011-07-08 – 2011-07-10 (×3): 400 mg via INTRAVENOUS
  Filled 2011-07-08 (×4): qty 200

## 2011-07-08 MED ORDER — MORPHINE SULFATE 4 MG/ML IJ SOLN
4.0000 mg | Freq: Once | INTRAMUSCULAR | Status: AC
  Administered 2011-07-08: 4 mg via INTRAVENOUS
  Filled 2011-07-08: qty 1

## 2011-07-08 MED ORDER — BIOTENE DRY MOUTH MT LIQD
15.0000 mL | Freq: Two times a day (BID) | OROMUCOSAL | Status: DC
  Administered 2011-07-08 – 2011-07-09 (×2): 15 mL via OROMUCOSAL

## 2011-07-08 MED ORDER — IOHEXOL 300 MG/ML  SOLN
100.0000 mL | Freq: Once | INTRAMUSCULAR | Status: AC | PRN
  Administered 2011-07-08: 100 mL via INTRAVENOUS

## 2011-07-08 MED ORDER — NIFEDIPINE ER OSMOTIC RELEASE 90 MG PO TB24
90.0000 mg | ORAL_TABLET | Freq: Every day | ORAL | Status: DC
  Administered 2011-07-08 – 2011-07-09 (×2): 90 mg via ORAL
  Filled 2011-07-08 (×4): qty 1

## 2011-07-08 MED ORDER — IOHEXOL 300 MG/ML  SOLN
20.0000 mL | INTRAMUSCULAR | Status: DC
  Administered 2011-07-08: 20 mL via ORAL

## 2011-07-08 MED ORDER — ONDANSETRON HCL 4 MG/2ML IJ SOLN
4.0000 mg | Freq: Four times a day (QID) | INTRAMUSCULAR | Status: DC | PRN
  Administered 2011-07-08 – 2011-07-09 (×2): 4 mg via INTRAVENOUS
  Filled 2011-07-08 (×2): qty 2

## 2011-07-08 MED ORDER — VALACYCLOVIR HCL 500 MG PO TABS
500.0000 mg | ORAL_TABLET | Freq: Two times a day (BID) | ORAL | Status: DC | PRN
  Filled 2011-07-08: qty 1

## 2011-07-08 MED ORDER — SODIUM CHLORIDE 0.9 % IV SOLN
INTRAVENOUS | Status: DC
  Administered 2011-07-08 – 2011-07-10 (×5): via INTRAVENOUS

## 2011-07-08 MED ORDER — MORPHINE SULFATE 4 MG/ML IJ SOLN
4.0000 mg | INTRAMUSCULAR | Status: DC | PRN
  Administered 2011-07-08 – 2011-07-09 (×4): 4 mg via INTRAVENOUS
  Filled 2011-07-08 (×4): qty 1

## 2011-07-08 MED ORDER — KCL IN DEXTROSE-NACL 20-5-0.9 MEQ/L-%-% IV SOLN: INTRAVENOUS | Status: DC

## 2011-07-08 NOTE — H&P (Signed)
Andrea Morton is an 21 y.o. female.   Chief Complaint: abd pain HPI: 20yo bf s/p recent lap chole with retained common bile duct stone. No ERCP. Did well but had ruq abd pain recur when she got home. No nausea or vomiting  Past Medical History  Diagnosis Date  . Herpes, genital   . Hypertension   . Pregnancy induced hypertension   . Obesity   . Preeclampsia, severe 2012    lead to C section.    Past Surgical History  Procedure Date  . No past surgeries   . Cesarean section 05/03/2011    Procedure: CESAREAN SECTION;  Surgeon: Janine Limbo, MD;  Location: [facility];  Service: Gynecology;  Laterality: N/A;  . Cholecystectomy 07/05/2011    Procedure: LAPAROSCOPIC CHOLECYSTECTOMY WITH INTRAOPERATIVE CHOLANGIOGRAM;  Surgeon: Ernestene Mention, MD;  Location: [facility] OR;  Service: General;  Laterality: N/A;    Family History  Problem Relation Age of Onset  . Diabetes Mother   . Obesity Mother   . Asthma Sister   . Obesity Sister   . Diabetes Maternal Aunt   . Obesity Maternal Aunt   . Diabetes Maternal Grandmother   . Anesthesia problems Neg Hx    Social History:  reports that she quit smoking about 8 months ago. Her smoking use included Cigarettes. She quit after 2 years of use. She has never used smokeless tobacco. She reports that she does not drink alcohol or use illicit drugs.  Allergies: No Known Allergies  Medications Prior to Admission  Medication Dose Route Frequency Provider Last Rate Last Dose  . iohexol (OMNIPAQUE) 300 MG/ML solution 100 mL  100 mL Intravenous Once PRN Medication Radiologist, MD   100 mL at 07/08/11 1652  . iohexol (OMNIPAQUE) 300 MG/ML solution 20 mL  20 mL Oral Q1 Hr x 2 Medication Radiologist, MD   20 mL at 07/08/11 1654  . morphine 4 MG/ML injection 4 mg  4 mg Intravenous Once [employer], PA-C   4 mg at 07/08/11 1433   Medications Prior to Admission  Medication Sig Dispense Refill  . HYDROcodone-acetaminophen (VICODIN) 5-500 MG per  tablet Take 1 tablet by mouth every 6 (six) hours as needed. For pain      . medroxyPROGESTERone (DEPO-PROVERA) 150 MG/ML injection Inject 1 mL (150 mg total) into the muscle every 3 (three) months.  1 mL  3  . NIFEdipine (PROCARDIA XL/ADALAT-CC) 90 MG 24 hr tablet Take 90 mg by mouth at bedtime.      . ranitidine (ZANTAC) 150 MG tablet Take 1 tablet (150 mg total) by mouth 2 (two) times daily.  60 tablet  1  . valACYclovir (VALTREX) 500 MG tablet Take 500 mg by mouth 2 (two) times daily as needed. for outbreaks        Results for orders placed during the hospital encounter of 07/08/11 (from the past 48 hour(s))  CBC     Status: Normal   Collection Time   07/08/11  2:08 PM      Component Value Range Comment   WBC 10.1  4.0 - 10.5 (K/uL)    RBC 4.13  3.87 - 5.11 (MIL/uL)    Hemoglobin 12.3  12.0 - 15.0 (g/dL)    HCT 13.0  86.5 - 78.4 (%)    MCV 89.3  78.0 - 100.0 (fL)    MCH 29.8  26.0 - 34.0 (pg)    MCHC 33.3  30.0 - 36.0 (g/dL)    RDW 14.7  11.5 - 15.5 (%)    Platelets 319  150 - 400 (K/uL)   DIFFERENTIAL     Status: Abnormal   Collection Time   07/08/11  2:08 PM      Component Value Range Comment   Neutrophils Relative 77  43 - 77 (%)    Neutro Abs 7.8 (*) 1.7 - 7.7 (K/uL)    Lymphocytes Relative 14  12 - 46 (%)    Lymphs Abs 1.4  0.7 - 4.0 (K/uL)    Monocytes Relative 7  3 - 12 (%)    Monocytes Absolute 0.7  0.1 - 1.0 (K/uL)    Eosinophils Relative 3  0 - 5 (%)    Eosinophils Absolute 0.3  0.0 - 0.7 (K/uL)    Basophils Relative 0  0 - 1 (%)    Basophils Absolute 0.0  0.0 - 0.1 (K/uL)   LIPASE, BLOOD     Status: Abnormal   Collection Time   07/08/11  2:08 PM      Component Value Range Comment   Lipase >3000 (*) 11 - 59 (U/L)   COMPREHENSIVE METABOLIC PANEL     Status: Abnormal   Collection Time   07/08/11  2:08 PM      Component Value Range Comment   Sodium 141  135 - 145 (mEq/L)    Potassium 3.5  3.5 - 5.1 (mEq/L)    Chloride 105  96 - 112 (mEq/L)    CO2 25  19 - 32 (mEq/L)     Glucose, Bld 109 (*) 70 - 99 (mg/dL)    BUN 4 (*) 6 - 23 (mg/dL)    Creatinine, Ser 1.61  0.50 - 1.10 (mg/dL)    Calcium 9.7  8.4 - 10.5 (mg/dL)    Total Protein 7.2  6.0 - 8.3 (g/dL)    Albumin 3.4 (*) 3.5 - 5.2 (g/dL)    AST 096 (*) 0 - 37 (U/L)    ALT 347 (*) 0 - 35 (U/L)    Alkaline Phosphatase 311 (*) 39 - 117 (U/L)    Total Bilirubin 3.3 (*) 0.3 - 1.2 (mg/dL)    GFR calc non Af Amer >90  >90 (mL/min)    GFR calc Af Amer >90  >90 (mL/min)   URINALYSIS, ROUTINE W REFLEX MICROSCOPIC     Status: Abnormal   Collection Time   07/08/11  3:19 PM      Component Value Range Comment   Color, Urine AMBER (*) YELLOW  BIOCHEMICALS MAY BE AFFECTED BY COLOR   APPearance CLEAR  CLEAR     Specific Gravity, Urine 1.007  1.005 - 1.030     pH 6.5  5.0 - 8.0     Glucose, UA NEGATIVE  NEGATIVE (mg/dL)    Hgb urine dipstick LARGE (*) NEGATIVE     Bilirubin Urine MODERATE (*) NEGATIVE     Ketones, ur NEGATIVE  NEGATIVE (mg/dL)    Protein, ur NEGATIVE  NEGATIVE (mg/dL)    Urobilinogen, UA 2.0 (*) 0.0 - 1.0 (mg/dL)    Nitrite NEGATIVE  NEGATIVE     Leukocytes, UA TRACE (*) NEGATIVE    PREGNANCY, URINE     Status: Normal   Collection Time   07/08/11  3:19 PM      Component Value Range Comment   Preg Test, Ur NEGATIVE  NEGATIVE    URINE MICROSCOPIC-ADD ON     Status: Abnormal   Collection Time   07/08/11  3:19 PM  Component Value Range Comment   Squamous Epithelial / LPF FEW (*) RARE     WBC, UA 0-2  <3 (WBC/hpf)    RBC / HPF 7-10  <3 (RBC/hpf)    No results found.  Review of Systems  Constitutional: Negative.   HENT: Negative.   Eyes: Negative.   Respiratory: Negative.   Cardiovascular: Negative.   Gastrointestinal: Positive for abdominal pain.  Genitourinary: Negative.   Musculoskeletal: Negative.   Skin: Negative.   Neurological: Negative.   Endo/Heme/Allergies: Negative.   Psychiatric/Behavioral: Negative.     Blood pressure 123/60, pulse 67, temperature 97.3 F (36.3 C),  temperature source Oral, resp. rate 14, SpO2 97.00%. Physical Exam  Constitutional: She is oriented to person, place, and time. She appears well-developed and well-nourished.  HENT:  Head: Normocephalic and atraumatic.  Eyes: Conjunctivae and EOM are normal. Pupils are equal, round, and reactive to light.  Neck: Normal range of motion. Neck supple.  Cardiovascular: Normal rate, regular rhythm and normal heart sounds.   Respiratory: Effort normal and breath sounds normal.  GI: Soft. Bowel sounds are normal. There is tenderness.  Musculoskeletal: Normal range of motion.  Neurological: She is alert and oriented to person, place, and time.  Skin: Skin is warm and dry.  Psychiatric: She has a normal mood and affect. Her behavior is normal.     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 Will admit and consult GI  TOTH III,Clinton Dragone S 07/08/2011, 4:56 PM

## 2011-07-08 NOTE — ED Provider Notes (Signed)
History     CSN: [id number]  Arrival date & time 07/08/11  1343   First MD Initiated Contact with Patient 07/08/11 1353      Chief Complaint  Patient presents with  . Abdominal Pain    (Consider location/radiation/quality/duration/timing/severity/associated sxs/prior treatment) HPI Comments: Patient had a lap cholecystectomy performed on 07/03/11 by Dr. Derrell Lolling due to Gallstone Pancreatitis.  She reports that she has had intermittent epigastric and RUQ pain since that time.  Pain became worse this morning.  She has been taking Oxycodone every 3-4 hours for the pain, but does not feel that it is helping.  She is feeling nauseous, but denies vomiting, chest pain, SOB, or fevers.  She reports that her last BM was 06/29/11.  She has been taking daily Miralax for constipation, but does not feel that it is helping.  Patient is a 21 y.o. female presenting with abdominal pain. The history is provided by the patient.  Abdominal Pain The primary symptoms of the illness include abdominal pain. The primary symptoms of the illness do not include fever, shortness of breath, nausea, vomiting, diarrhea or dysuria.  The patient states that she believes she is currently not pregnant. The patient has had a change in bowel habit. Additional symptoms associated with the illness include constipation. Symptoms associated with the illness do not include chills, diaphoresis or back pain.    Past Medical History  Diagnosis Date  . Herpes, genital   . Hypertension   . Pregnancy induced hypertension   . Obesity   . Preeclampsia, severe 2012    lead to C section.    Past Surgical History  Procedure Date  . No past surgeries   . Cesarean section 05/03/2011    Procedure: CESAREAN SECTION;  Surgeon: Janine Limbo, MD;  Location: [facility];  Service: Gynecology;  Laterality: N/A;  . Cholecystectomy 07/05/2011    Procedure: LAPAROSCOPIC CHOLECYSTECTOMY WITH INTRAOPERATIVE CHOLANGIOGRAM;  Surgeon: Ernestene Mention,  MD;  Location: [facility] OR;  Service: General;  Laterality: N/A;    Family History  Problem Relation Age of Onset  . Diabetes Mother   . Obesity Mother   . Asthma Sister   . Obesity Sister   . Diabetes Maternal Aunt   . Obesity Maternal Aunt   . Diabetes Maternal Grandmother   . Anesthesia problems Neg Hx     History  Substance Use Topics  . Smoking status: Former Smoker -- 2 years    Types: Cigarettes    Quit date: 10/13/2010  . Smokeless tobacco: Never Used  . Alcohol Use: No    OB History    Grav Para Term Preterm Abortions TAB SAB Ect Mult Living   1 1 0 1 0 0 0 0 0 1       Review of Systems  Constitutional: Negative for fever, chills and diaphoresis.  Respiratory: Negative for shortness of breath.   Gastrointestinal: Positive for abdominal pain and constipation. Negative for nausea, vomiting and diarrhea.  Genitourinary: Negative for dysuria.  Musculoskeletal: Negative for back pain.  Skin: Negative for color change.  Neurological: Negative for dizziness, syncope and light-headedness.    Allergies  Review of patient's allergies indicates no known allergies.  Home Medications   Current Outpatient Rx  Name Route Sig Dispense Refill  . HYDROCODONE-ACETAMINOPHEN 5-500 MG PO TABS Oral Take 1 tablet by mouth every 6 (six) hours as needed. For pain    . MEDROXYPROGESTERONE ACETATE 150 MG/ML IM SUSP Intramuscular Inject 1 mL (150 mg total) into  the muscle every 3 (three) months. 1 mL 3    Give first dose prior to discharge home.  Marland Kitchen NIFEDIPINE ER OSMOTIC 90 MG PO TB24 Oral Take 90 mg by mouth at bedtime.    Marland Kitchen RANITIDINE HCL 150 MG PO TABS Oral Take 1 tablet (150 mg total) by mouth 2 (two) times daily. 60 tablet 1  . VALACYCLOVIR HCL 500 MG PO TABS Oral Take 500 mg by mouth 2 (two) times daily as needed. for outbreaks      BP 127/74  Pulse 77  Temp(Src) 97.3 F (36.3 C) (Oral)  Resp 18  SpO2 99%  Physical Exam  Nursing note and vitals reviewed. Constitutional: She  is oriented to person, place, and time. She appears well-developed and well-nourished. No distress.  HENT:  Head: Normocephalic and atraumatic.  Neck: Normal range of motion. Neck supple.  Cardiovascular: Normal rate and normal heart sounds.   Pulmonary/Chest: Effort normal and breath sounds normal. No respiratory distress. She has no wheezes.  Abdominal: There is tenderness in the right upper quadrant and epigastric area.  Musculoskeletal: Normal range of motion.  Neurological: She is alert and oriented to person, place, and time.  Skin: Skin is warm and dry. She is not diaphoretic.       Incision sites healing well with no surrounding erythema, drainage, or induration  Psychiatric: She has a normal mood and affect.    ED Course  Procedures (including critical care time)   Labs Reviewed  CBC  DIFFERENTIAL  LIPASE, BLOOD  COMPREHENSIVE METABOLIC PANEL  URINALYSIS, ROUTINE W REFLEX MICROSCOPIC   No results found.   No diagnosis found.  4:20 PM Discussed patient with [facility].  They report that they will come see patient in the ED and will consult GI. 4:32 PM Reassessed patient.  She reports that her pain is tolerable at this time.  CT abdomen pending  MDM  Patient with recurrent epigastric and RUQ pain s/p cholecystectomy on 07/03/11 done by Dr. Derrell Lolling.  Lipase today is greater than 3000.  Lipase 2 days ago was 92.  Patient's total bilirubin and LFT's are also increased from 2 days ago.   Therefore, surgery was consulted.        Pascal Lux Unity, PA-C 07/09/11 1811

## 2011-07-08 NOTE — ED Notes (Signed)
Per ems- pt had gall bladder removed on Thursday. Pt has had pain since then and she has not been eating. Pt has not had BM since Thursday. Pt has swelling in Right upper quad. Pt was also told by MD that "pancreas was swollen."

## 2011-07-08 NOTE — Consult Note (Signed)
[facility] Referring Provider: Dr. Royston Sinner Primary Care Physician:  Ardyth Gal, MD, MD Primary Gastroenterologist:  Dr. Christella Hartigan, myself  Reason for Consultation: lft elevation, recurrent pancreatitis   HPI:  Andrea Morton is a 21 y.o. female whom I saw in consult 3 days ago after lap chole IOC showed no drainage from CBD. LFTs the day after bumped (ast 210, alt 166, alk phos 180, t bili was normal).  However I thought the findings may be from post operative irritation of liver, perhaps swelling of pancreas (she had presented with GS pancreatitis).  I set her up for LFTs to be checked tomorrow in GI office.  Unfortunately, her RUQ pains returned and she came to ER.  LFTs more elevated now (alk phos 311, ast 233, alt 347, now tbili 3.3) and CT scan shows "prominance of extrahepatic bile ducts" but no biloma to suggest biliary leak.  Lipase is again elevated (>3000 now).  No vomiting, but ++nausea.  No fevers or chills.  She wants to eat, drink.  IOC during last week lap chole: Findings: Injection of the cystic duct remnant demonstrates mild  prominence of the common hepatic and bile ducts. No discrete  intraluminal filling defects are identified. However, there is no  drainage into the duodenum. There is no extravasation. The  visualized intrahepatic biliary system appears normal.  IMPRESSION:  Nonvisualization of the drainage into the duodenum could be  secondary to spasm of the sphincter of Oddi or a retained calculus  in the distal common bile duct.   Past Medical History  Diagnosis Date  . Herpes, genital   . Hypertension   . Pregnancy induced hypertension   . Obesity   . Preeclampsia, severe 2012    lead to C section.    Past Surgical History  Procedure Date  . No past surgeries   . Cesarean section 05/03/2011    Procedure: CESAREAN SECTION;  Surgeon: Janine Limbo, MD;  Location: [facility];  Service: Gynecology;  Laterality: N/A;  .  Cholecystectomy 07/05/2011    Procedure: LAPAROSCOPIC CHOLECYSTECTOMY WITH INTRAOPERATIVE CHOLANGIOGRAM;  Surgeon: Ernestene Mention, MD;  Location: [facility] OR;  Service: General;  Laterality: N/A;    Prior to Admission medications   Medication Sig Start Date End Date Taking? Authorizing Provider  HYDROcodone-acetaminophen (VICODIN) 5-500 MG per tablet Take 1 tablet by mouth every 6 (six) hours as needed. For pain 07/06/11 07/16/11 Yes Edd Arbour, MD  medroxyPROGESTERone (DEPO-PROVERA) 150 MG/ML injection Inject 1 mL (150 mg total) into the muscle every 3 (three) months. 05/06/11  Yes Hal Morales, MD  NIFEdipine (PROCARDIA XL/ADALAT-CC) 90 MG 24 hr tablet Take 90 mg by mouth at bedtime.   Yes Historical Provider, MD  ranitidine (ZANTAC) 150 MG tablet Take 1 tablet (150 mg total) by mouth 2 (two) times daily. 07/02/11 07/01/12 Yes Ellin Mayhew, MD  valACYclovir (VALTREX) 500 MG tablet Take 500 mg by mouth 2 (two) times daily as needed. for outbreaks 06/21/11  Yes Ardyth Gal, MD    Current Facility-Administered Medications  Medication Dose Route Frequency Provider Last Rate Last Dose  . iohexol (OMNIPAQUE) 300 MG/ML solution 100 mL  100 mL Intravenous Once PRN Medication Radiologist, MD   100 mL at 07/08/11 1652  . iohexol (OMNIPAQUE) 300 MG/ML solution 20 mL  20 mL Oral Q1 Hr x 2 Medication Radiologist, MD   20 mL at 07/08/11 1654  . morphine 4 MG/ML injection 4 mg  4 mg Intravenous Once [employer], PA-C  4 mg at 07/08/11 1433   Current Outpatient Prescriptions  Medication Sig Dispense Refill  . HYDROcodone-acetaminophen (VICODIN) 5-500 MG per tablet Take 1 tablet by mouth every 6 (six) hours as needed. For pain      . medroxyPROGESTERone (DEPO-PROVERA) 150 MG/ML injection Inject 1 mL (150 mg total) into the muscle every 3 (three) months.  1 mL  3  . NIFEdipine (PROCARDIA XL/ADALAT-CC) 90 MG 24 hr tablet Take 90 mg by mouth at bedtime.      . ranitidine (ZANTAC) 150 MG tablet  Take 1 tablet (150 mg total) by mouth 2 (two) times daily.  60 tablet  1  . valACYclovir (VALTREX) 500 MG tablet Take 500 mg by mouth 2 (two) times daily as needed. for outbreaks        Allergies as of 07/08/2011  . (No Known Allergies)    Family History  Problem Relation Age of Onset  . Diabetes Mother   . Obesity Mother   . Asthma Sister   . Obesity Sister   . Diabetes Maternal Aunt   . Obesity Maternal Aunt   . Diabetes Maternal Grandmother   . Anesthesia problems Neg Hx     History   Social History  . Marital Status: Single    Spouse Name: N/A    Number of Children: N/A  . Years of Education: N/A   Occupational History  . Not on file.   Social History Main Topics  . Smoking status: Former Smoker -- 2 years    Types: Cigarettes    Quit date: 10/13/2010  . Smokeless tobacco: Never Used  . Alcohol Use: No  . Drug Use: No  . Sexually Active: Yes    Birth Control/ Protection: Injection   Other Topics Concern  . Not on file   Social History Narrative   Unemployed, finished high school.1 child- 2 months old. No exercise. Doesn't eat a healthy diet.      Review of Systems: Pertinent positive and negative review of systems were noted in the above HPI section. Complete review of systems was performed and was otherwise normal.   Physical Exam: Vital signs in last 24 hours: Temp:  [97.3 F (36.3 C)] 97.3 F (36.3 C) (02/03 1408) Pulse Rate:  [67-77] 67  (02/03 1651) Resp:  [14-18] 14  (02/03 1651) BP: (123-127)/(60-74) 123/60 mmHg (02/03 1651) SpO2:  [97 %-99 %] 97 % (02/03 1651)   Constitutional: generally well-appearing Psychiatric: alert and oriented x3 Eyes: extraocular movements intact Mouth: oral pharynx moist, no lesions Neck: supple no lymphadenopathy Cardiovascular: heart regular rate and rhythm Lungs: clear to auscultation bilaterally Abdomen: soft, mild to moderate tenderness epigastrium and ruq, nondistended, no obvious ascites, no peritoneal  signs, normal bowel sounds Extremities: no lower extremity edema bilaterally Skin: no lesions on visible extremities Lab Results:  Basename 07/08/11 1408 07/06/11 0605  WBC 10.1 6.6  HGB 12.3 11.6*  HCT 36.9 34.6*  PLT 319 299  MCV 89.3 88.7   BMET  Basename 07/08/11 1408 07/06/11 0605  NA 141 140  K 3.5 3.7  CL 105 106  CO2 25 24  GLUCOSE 109* 111*  BUN 4* 4*  CREATININE 0.79 0.79  CALCIUM 9.7 9.0   LFT  Basename 07/08/11 1408  BILITOT 3.3*  BILIDIR --  IBILI --  AST 233*  ALT 347*  ALKPHOS 311*  PROT 7.2  ALBUMIN 3.4*   Imaging/Other Results: Ct Abdomen W Contrast  07/08/2011  *RADIOLOGY REPORT*  Clinical Data:  5 days  status post cholecystectomy.  Worsening diffuse abdominal pain.  CT ABDOMEN WITH CONTRAST  Technique:  Multidetector CT imaging of the abdomen was performed using the standard protocol following bolus administration of intravenous contrast.  Contrast:  100 ml Omnipaque-300 and oral contrast  Comparison:   None  Findings:  Expected postoperative changes from recent cholecystectomy are seen. Mild prominence of extrahepatic common bile ducts noted.  No abnormal fluid collections are seen in the gallbladder fossa were elsewhere within the abdomen.  The abdominal parenchymal organs are normal in appearance.  No soft tissue masses or lymphadenopathy identified.  No evidence of dilated bowel loops or focal inflammatory process.  IMPRESSION: Expected postoperative changes from recent cholecystectomy.  No abnormal fluid collections or other acute findings identified within the abdomen.  Original Report Authenticated By: Danae Orleans, M.D.      Impression/Plan: 21 y.o. female with likely retained stone, recurrent biliary pancreatitis following lap chole last week  She will need ERCP.  WE will coordinate timing with anesthesia (will need general given her morbid obesity, nature of the case).  For now, NPO, IVFs, IV abx since likey she has stones in  CBD.    Rob Bunting, MD  07/08/2011, 5:35 PM [facility] Pager [phone]

## 2011-07-08 NOTE — ED Notes (Signed)
Pt notified x2 of need to collect UA.

## 2011-07-08 NOTE — ED Notes (Signed)
Pt c/o diffuse upper abd pain since cholecystectomy on Thursday. Pt states that pain became much worse this am around 3 or 4. Pt states she has been taking oxycodone more than needed but pain is not going away. Pt states she has not been drinking or eating well and has not had BM since Thursday. Denies n/v.

## 2011-07-08 NOTE — ED Notes (Signed)
Consulting MD at bedside. Informed patient and/or family of status. Awaiting bed assignment.

## 2011-07-08 NOTE — ED Notes (Signed)
Report received, assumed care.  

## 2011-07-09 ENCOUNTER — Observation Stay (HOSPITAL_COMMUNITY): Payer: Medicaid Other

## 2011-07-09 ENCOUNTER — Encounter (HOSPITAL_COMMUNITY): Payer: Self-pay | Admitting: Certified Registered"

## 2011-07-09 ENCOUNTER — Encounter (HOSPITAL_COMMUNITY)
Admission: EM | Disposition: A | Discharge status: Home / Self Care | Payer: Self-pay | Source: Home / Self Care | Attending: Emergency Medicine

## 2011-07-09 ENCOUNTER — Observation Stay (HOSPITAL_COMMUNITY): Payer: Medicaid Other | Admitting: Certified Registered"

## 2011-07-09 ENCOUNTER — Encounter (HOSPITAL_COMMUNITY): Payer: Self-pay | Admitting: Gastroenterology

## 2011-07-09 ENCOUNTER — Encounter (HOSPITAL_COMMUNITY): Payer: Self-pay | Admitting: *Deleted

## 2011-07-09 DIAGNOSIS — K8050 Calculus of bile duct without cholangitis or cholecystitis without obstruction: Secondary | ICD-10-CM | POA: Diagnosis present

## 2011-07-09 HISTORY — PX: ERCP: SHX5425

## 2011-07-09 LAB — COMPREHENSIVE METABOLIC PANEL
AST: 89 U/L — ABNORMAL HIGH (ref 0–37)
Albumin: 3.4 g/dL — ABNORMAL LOW (ref 3.5–5.2)
BUN: 5 mg/dL — ABNORMAL LOW (ref 6–23)
Chloride: 107 mEq/L (ref 96–112)
Creatinine, Ser: 0.74 mg/dL (ref 0.50–1.10)
Total Bilirubin: 0.7 mg/dL (ref 0.3–1.2)
Total Protein: 7.4 g/dL (ref 6.0–8.3)

## 2011-07-09 LAB — CBC
HCT: 36.3 % (ref 36.0–46.0)
Hemoglobin: 11.9 g/dL — ABNORMAL LOW (ref 12.0–15.0)
MCV: 90.3 fL (ref 78.0–100.0)
RBC: 4.02 MIL/uL (ref 3.87–5.11)
RDW: 15.1 % (ref 11.5–15.5)
WBC: 8.9 10*3/uL (ref 4.0–10.5)

## 2011-07-09 LAB — LIPASE, BLOOD: Lipase: 230 U/L — ABNORMAL HIGH (ref 11–59)

## 2011-07-09 SURGERY — ERCP, WITH INTERVENTION IF INDICATED
Anesthesia: General

## 2011-07-09 MED ORDER — GLYCOPYRROLATE 0.2 MG/ML IJ SOLN
INTRAMUSCULAR | Status: DC | PRN
  Administered 2011-07-09: .4 mg via INTRAVENOUS

## 2011-07-09 MED ORDER — PROPOFOL 10 MG/ML IV EMUL
INTRAVENOUS | Status: DC | PRN
  Administered 2011-07-09: 200 mg via INTRAVENOUS
  Administered 2011-07-09: 20 mg via INTRAVENOUS
  Administered 2011-07-09: 30 mg via INTRAVENOUS

## 2011-07-09 MED ORDER — MIDAZOLAM HCL 5 MG/5ML IJ SOLN
INTRAMUSCULAR | Status: DC | PRN
  Administered 2011-07-09: 2 mg via INTRAVENOUS

## 2011-07-09 MED ORDER — ONDANSETRON HCL 4 MG/2ML IJ SOLN
INTRAMUSCULAR | Status: DC | PRN
  Administered 2011-07-09: 4 mg via INTRAVENOUS

## 2011-07-09 MED ORDER — GLUCAGON HCL (RDNA) 1 MG IJ SOLR
1.0000 mg | Freq: Once | INTRAMUSCULAR | Status: DC | PRN
  Filled 2011-07-09: qty 1

## 2011-07-09 MED ORDER — ONDANSETRON HCL 4 MG/2ML IJ SOLN: 4.0000 mg | Freq: Once | INTRAMUSCULAR | Status: DC | PRN

## 2011-07-09 MED ORDER — LACTATED RINGERS IV SOLN
INTRAVENOUS | Status: DC | PRN
  Administered 2011-07-09 (×2): via INTRAVENOUS

## 2011-07-09 MED ORDER — IOHEXOL 300 MG/ML  SOLN
INTRAMUSCULAR | Status: DC | PRN
  Administered 2011-07-09: 19:00:00

## 2011-07-09 MED ORDER — MEPERIDINE HCL 25 MG/ML IJ SOLN: 6.2500 mg | INTRAMUSCULAR | Status: DC | PRN

## 2011-07-09 MED ORDER — PHENYLEPHRINE HCL 10 MG/ML IJ SOLN
INTRAMUSCULAR | Status: DC | PRN
  Administered 2011-07-09 (×3): 40 ug via INTRAVENOUS

## 2011-07-09 MED ORDER — NEOSTIGMINE METHYLSULFATE 1 MG/ML IJ SOLN
INTRAMUSCULAR | Status: DC | PRN
  Administered 2011-07-09: 3 mg via INTRAVENOUS

## 2011-07-09 MED ORDER — ROCURONIUM BROMIDE 100 MG/10ML IV SOLN
INTRAVENOUS | Status: DC | PRN
  Administered 2011-07-09: 50 mg via INTRAVENOUS

## 2011-07-09 MED ORDER — HYDROMORPHONE HCL PF 1 MG/ML IJ SOLN: 0.2500 mg | INTRAMUSCULAR | Status: DC | PRN

## 2011-07-09 MED ORDER — FENTANYL CITRATE 0.05 MG/ML IJ SOLN
INTRAMUSCULAR | Status: DC | PRN
  Administered 2011-07-09 (×3): 100 ug via INTRAVENOUS
  Administered 2011-07-09: 50 ug via INTRAVENOUS

## 2011-07-09 MED ORDER — MORPHINE SULFATE 4 MG/ML IJ SOLN: 0.0500 mg/kg | INTRAMUSCULAR | Status: DC | PRN

## 2011-07-09 NOTE — Preoperative (Signed)
Beta Blockers   Reason not to administer Beta Blockers:Not Applicable 

## 2011-07-09 NOTE — Progress Notes (Signed)
ERCP demonstrated a markedly dilated common bile duct. No stones were seen. A 15 mm sphincterotomy was made and the balloon stone extractor was pulled through the sphincterotomized papilla.  No stones were extracted. There was prompt emptying of contrast dye from the duct.  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  Recommendations #1 continue antibiotics #2 repeat LFTs in a.m.

## 2011-07-09 NOTE — Anesthesia Postprocedure Evaluation (Signed)
  Anesthesia Post-op Note  Patient: Andrea Morton  Procedure(s) Performed:  ENDOSCOPIC RETROGRADE CHOLANGIOPANCREATOGRAPHY (ERCP)  Patient Location: PACU  Anesthesia Type: General  Level of Consciousness: awake and sedated  Airway and Oxygen Therapy: Patient Spontanous Breathing and Patient connected to nasal cannula oxygen  Post-op Pain: none  Post-op Assessment: Post-op Vital signs reviewed, Patient's Cardiovascular Status Stable, Respiratory Function Stable, Patent Airway, No signs of Nausea or vomiting and Pain level controlled  Post-op Vital Signs: Reviewed and stable  Complications: No apparent anesthesia complications

## 2011-07-09 NOTE — Interval H&P Note (Signed)
History and Physical Interval Note:  07/09/2011 5:09 PM  Margarete D Rickett  has presented today for surgery, with the diagnosis of choledocholithiasi  The various methods of treatment have been discussed with the patient and family. After consideration of risks, benefits and other options for treatment, the patient has consented to  Procedure(s): ENDOSCOPIC RETROGRADE CHOLANGIOPANCREATOGRAPHY (ERCP) as a surgical intervention .  The patients' history has been reviewed, patient examined, no change in status, stable for surgery.  I have reviewed the patients' chart and labs.  Questions were answered to the patient's satisfaction.    The recent H&P (dated *07/06/11**) was reviewed, the patient was examined and there is no change in the patients condition since that H&P was completed.   Melvia Heaps  07/09/2011, 5:09 PM    Melvia Heaps

## 2011-07-09 NOTE — ED Provider Notes (Signed)
Medical screening examination/treatment/procedure(s) were conducted as a shared visit with non-physician practitioner(s) and myself.  I personally evaluated the patient during the encounter  Epigastric, RUQ pain s/p lap choley 1/29. +nausea, no fever.  Incisions healing well, moderate upper abdominal tenderness.  Elevated lipase and LFTs, concern for recurrent gallstone pancreatitis.  D/w surgery and GI.  Glynn Octave, MD 07/09/11 [phone]

## 2011-07-09 NOTE — Progress Notes (Signed)
ERCP pending per GI. Lipase improving. Patient examined and I agree with the assessment and plan  Violeta Gelinas, MD, MPH, FACS Pager: [phone]  07/09/2011 10:29 AM

## 2011-07-09 NOTE — Transfer of Care (Signed)
Immediate Anesthesia Transfer of Care Note  Patient: Andrea Morton  Procedure(s) Performed:  ENDOSCOPIC RETROGRADE CHOLANGIOPANCREATOGRAPHY (ERCP)  Patient Location: PACU  Anesthesia Type: General  Level of Consciousness: awake, sedated and patient cooperative  Airway & Oxygen Therapy: Patient Spontanous Breathing and Patient connected to nasal cannula oxygen  Post-op Assessment: Report given to PACU RN, Post -op Vital signs reviewed and stable and Patient moving all extremities  Post vital signs: Reviewed and stable  Complications: No apparent anesthesia complications

## 2011-07-09 NOTE — Progress Notes (Signed)
  Subjective: Pt ok. Mildly sore at umbilical incision. No N/V  Objective: Vital signs in last 24 hours: Temp:  [97.3 F (36.3 C)-98.6 F (37 C)] 98.3 F (36.8 C) (02/04 0605) Pulse Rate:  [67-84] 84  (02/04 0605) Resp:  [14-20] 18  (02/04 0605) BP: (122-144)/(53-110) 126/53 mmHg (02/04 0605) SpO2:  [95 %-99 %] 97 % (02/04 0605) Weight:  [161 kg (354 lb 15.1 oz)] 161 kg (354 lb 15.1 oz) (02/03 2000) Last BM Date: 06/30/11  Intake/Output this shift:    Physical Exam: BP 126/53  Pulse 84  Temp(Src) 98.3 F (36.8 C) (Oral)  Resp 18  Ht 5\' 11"  (1.803 m)  Wt 161 kg (354 lb 15.1 oz)  BMI 49.50 kg/m2  SpO2 97% Lungs: CTA without w/r/r Heart: Regular Abdomen: soft, ND, appropriately tender   Incisions all c/d/i without erythema or hematoma. Ext: No edema or tenderness   Labs: CBC  Basename 07/09/11 0645 07/08/11 1408  WBC 8.9 10.1  HGB 11.9* 12.3  HCT 36.3 36.9  PLT 334 319   BMET  Basename 07/09/11 0645 07/08/11 1408  NA 141 141  K 3.4* 3.5  CL 107 105  CO2 25 25  GLUCOSE 101* 109*  BUN 5* 4*  CREATININE 0.74 0.79  CALCIUM 9.6 9.7   LFT  Basename 07/09/11 0645  PROT 7.4  ALBUMIN 3.4*  AST 89*  ALT 259*  ALKPHOS 297*  BILITOT 0.7  BILIDIR --  IBILI --  LIPASE 230*   PT/INR No results found for this basename: LABPROT:2,INR:2 in the last 72 hours ABG No results found for this basename: PHART:2,PCO2:2,PO2:2,HCO3:2 in the last 72 hours  Studies/Results: Ct Abdomen W Contrast  07/08/2011  *RADIOLOGY REPORT*  Clinical Data:  5 days status post cholecystectomy.  Worsening diffuse abdominal pain.  CT ABDOMEN WITH CONTRAST  Technique:  Multidetector CT imaging of the abdomen was performed using the standard protocol following bolus administration of intravenous contrast.  Contrast:  100 ml Omnipaque-300 and oral contrast  Comparison:   None  Findings:  Expected postoperative changes from recent cholecystectomy are seen. Mild prominence of extrahepatic  common bile ducts noted.  No abnormal fluid collections are seen in the gallbladder fossa were elsewhere within the abdomen.  The abdominal parenchymal organs are normal in appearance.  No soft tissue masses or lymphadenopathy identified.  No evidence of dilated bowel loops or focal inflammatory process.  IMPRESSION: Expected postoperative changes from recent cholecystectomy.  No abnormal fluid collections or other acute findings identified within the abdomen.  Original Report Authenticated By: Danae Orleans, M.D.    Assessment: Active Problems:  Gallstone pancreatitis  Choledocholithiasis  Plan: Enzymes better this am. Hopefully for ERCP soon.  LOS: 1 day    Alyse Low 07/09/2011 8:40 AM

## 2011-07-09 NOTE — Anesthesia Preprocedure Evaluation (Addendum)
Anesthesia Evaluation  Patient identified by MRN, date of birth, ID band Patient awake    Reviewed: Allergy & Precautions, H&P , NPO status , Patient's Chart, lab work & pertinent test results  Airway Mallampati: III TM Distance: >3 FB Neck ROM: Full    Dental  (+) Poor Dentition, Chipped and Dental Advisory Given,    Pulmonary          Cardiovascular     Neuro/Psych    GI/Hepatic   Endo/Other    Renal/GU      Musculoskeletal   Abdominal   Peds  Hematology   Anesthesia Other Findings   Reproductive/Obstetrics                           Anesthesia Physical Anesthesia Plan  ASA: II  Anesthesia Plan: General ETT   Post-op Pain Management:    Induction:   Airway Management Planned:   Additional Equipment:   Intra-op Plan:   Post-operative Plan:   Informed Consent: I have reviewed the patients History and Physical, chart, labs and discussed the procedure including the risks, benefits and alternatives for the proposed anesthesia with the patient or authorized representative who has indicated his/her understanding and acceptance.     Plan Discussed with: CRNA and Surgeon  Anesthesia Plan Comments:         Anesthesia Quick Evaluation

## 2011-07-09 NOTE — Op Note (Signed)
[name] [address] [place], [place]  [address]  ERCP PROCEDURE REPORT  PATIENT:  Andrea Morton, Andrea Morton  MR#:  [id number] BIRTHDATE:  January 20, 1991  GENDER:  female  ENDOSCOPIST:  Barbette Hair. Arlyce Dice, MD ASSISTANT:  PROCEDURE DATE:  07/09/2011 PROCEDURE:  ERCP with balloon passage, ERCP with sphincterotomy, ERCP with stent placement ASA CLASS:  Class II  INDICATIONS:  suspected stone  MEDICATIONS:   MAC sedation, administered by CRNA TOPICAL ANESTHETIC:  DESCRIPTION OF PROCEDURE:   After the risks benefits and alternatives of the procedure were thoroughly explained, informed consent was obtained.  The  endoscope was introduced through the mouth and advanced to the third portion of the duodenum.  A dilatation was found in the common bile duct. Initial attempts to cannulate the common bile duct were unsuccessful.the guidewire was preferentially fallen into the pancreatic duct. A 0.35 mm guidewire was placed into the pancreatic duct. A 4 French 3 cm pancreatic duct was inserted into the pancreatic duct to facilitate cannulation of the common bile duct. following this the common bile duct was selectively cannulated with a 0.053mm guidewire. Injection of the common bile duct demonstrated diffuse dilatation. No filling defects were seen.  A 15 mm sphincterotomy was made. Following this a 15 mm balloon stone extractor was slated at the bifurcation of the common hepatic duct. The common bile duct was swept on multiple occasions. No stones were extracted from the duct. There was prompt emptying of contrast dye from the common bile duct (see image1 and image2).    The scope was then completely withdrawn from the patient and the procedure terminated. <<PROCEDUREIMAGES>>  COMPLICATIONS:  None  ENDOSCOPIC IMPRESSION: 1) Dilatation in the common bile duct - suspect passed bile duct stone versus a biliary stenosis; status post sphincterotomy #2 status post  pancreatic duct stent insertion RECOMMENDATIONS: #1 followup LFTs #2 check abdominal x-ray in 3 days to determine if pancreatic stent is still in place  ______________________________ Barbette Hair. Arlyce Dice, MD  CC Dr. Thomes Dinning Dr. Enid Baas  CC:  n. eSIGNED:   Barbette Hair. Zyquan Crotty at 07/09/2011 07:34 PM  [place], [place], [phone]

## 2011-07-09 NOTE — H&P (View-Only) (Signed)
    Gi Daily Rounding Note 07/06/2011, 8:49 AM  SUBJECTIVE: Andrea Morton has minimal abdominal pain, not using a lot of narcotics. No nausea.  Hungry  OBJECTIVE: General: Looks well.  NAD  Vital signs in last 24 hours: Temp:  [97.4 F (36.3 C)-99.5 F (37.5 C)] 98 F (36.7 C) (02/01 0634) Pulse Rate:  [67-80] 67  (02/01 0634) Resp:  [18-24] 18  (02/01 0634) BP: (132-153)/(65-84) 132/65 mmHg (02/01 0634) SpO2:  [96 %-100 %] 99 % (02/01 0634) Last BM Date: 07/01/11  Heart: RRR Chest: clear  Abdomen: soft slight tenderness, upper abdomen B.  Extremities: no edema Neuro/Psych:  Pleasant, cooperative.   Intake/Output from previous day: 01/31 0701 - 02/01 0700 In: 2914 [P.O.:562; I.V.:2250; IV Piggyback:102] Out: -   Intake/Output this shift:    Lab Results:  Basename 07/06/11 0605 07/05/11 1735 07/05/11 0650  WBC 6.6 12.0* 7.9  HGB 11.6* 11.8* 11.7*  HCT 34.6* 35.3* 35.3*  PLT 299 283 290   BMET  Basename 07/06/11 0605 07/05/11 1735 07/05/11 0835 07/04/11 0435  NA 140 -- 141 141  K 3.7 -- 4.0 3.5  CL 106 -- 107 106  CO2 24 -- 24 23  GLUCOSE 111* -- 86 87  BUN 4* -- 6 10  CREATININE 0.79 0.77 0.78 --  CALCIUM 9.0 -- 9.1 9.1   LFT  Basename 07/06/11 0605  PROT 6.7  ALBUMIN 3.0*  AST 210*  ALT 166*  ALKPHOS 180*  BILITOT 1.1  BILIDIR --  IBILI --   PT/INR  Basename 07/04/11 0853  LABPROT 14.5  INR 1.11   Studies/Results: Dg Chest 1 View  07/04/2011  *RADIOLOGY REPORT*  Clinical Data: Preop gallbladder surgery.  CHEST - 1 VIEW  Comparison: None  Findings: Heart and mediastinal contours are within normal limits. No focal opacities or effusions.  No acute bony abnormality.  IMPRESSION: No active cardiopulmonary disease.  Original Report Authenticated By: KEVIN G. DOVER, M.D.   Dg Cholangiogram Operative  07/05/2011  *RADIOLOGY REPORT*  Clinical Data:   Laparoscopic cholecystectomy.  INTRAOPERATIVE CHOLANGIOGRAM  Technique:  Cholangiographic images from  the C-arm fluoroscopic device were submitted for interpretation post-operatively.  Please see the procedural report for the amount of contrast and the fluoroscopy time utilized.  Comparison:  Abdominal ultrasound 07/03/2011.  Findings:  Injection of the cystic duct remnant demonstrates mild prominence of the common hepatic and bile ducts.  No discrete intraluminal filling defects are identified.  However, there is no drainage into the duodenum.  There is no extravasation.  The visualized intrahepatic biliary system appears normal.  IMPRESSION: Nonvisualization of the drainage into the duodenum could be secondary to spasm of the sphincter of Oddi or a retained calculus in the distal common bile duct.  Correlate clinically.  Original Report Authenticated By: WILLIAM B. VEAZEY, M.D.    ASSESMENT: 1. S/p lap chole.  ? CBD stone on IOC?  LFTs are up.  Clinically pt in no distress.  D/W Dr Jeno Calleros who thinks this could be transient post up finding due to edema.  Wants to get LFTs next week, if they remain elevated, he will orchestrate an out pt ERCP.   2. Biliary pancreatitis.     3. 05/03/11 c-section, precipitated by severe preeclampsia.  4. Prior hx LFT abnormality, possibly related to preeclampsia, had resolved to normal.  5. Morbid Obesity.   PLAN: 1. Labs , our office, next Tuesday.   2.  Ordered low fat diet.   3.  D/C pepcid and   IV protonix (on both)  Was on no PPI PTA.  LOS: 3 days   Andrea Morton  07/06/2011, 8:49 AM Pager: [phone]   ________________________________________________________________________  [facility] MD note:  I personally examined the patient, reviewed the data and agree with the assessment and plan described above.  LFTs did bump after sugery.  Andrea Morton feels very well post op.  IOC shows non-empyting CBD but not a clear stone.  Given recent pancreatitis, morbid obesity, I would not proceed with ERCP at this point. Andrea Morton will get LFTs checked at my office early next week and if  rising or not normalize then we'll reconsider ERCP.  OK to d/c home for now.   Greco Gastelum, MD [facility] Pager [phone]  

## 2011-07-10 ENCOUNTER — Encounter (HOSPITAL_COMMUNITY): Payer: Self-pay | Admitting: Gastroenterology

## 2011-07-10 ENCOUNTER — Other Ambulatory Visit: Payer: Self-pay | Admitting: Gastroenterology

## 2011-07-10 DIAGNOSIS — K8050 Calculus of bile duct without cholangitis or cholecystitis without obstruction: Secondary | ICD-10-CM

## 2011-07-10 DIAGNOSIS — Z9889 Other specified postprocedural states: Secondary | ICD-10-CM

## 2011-07-10 LAB — HEPATIC FUNCTION PANEL
ALT: 159 U/L — ABNORMAL HIGH (ref 0–35)
Albumin: 3.2 g/dL — ABNORMAL LOW (ref 3.5–5.2)
Alkaline Phosphatase: 237 U/L — ABNORMAL HIGH (ref 39–117)
Total Bilirubin: 0.4 mg/dL (ref 0.3–1.2)

## 2011-07-10 LAB — CBC
MCH: 30.2 pg (ref 26.0–34.0)
MCHC: 33.7 g/dL (ref 30.0–36.0)
MCV: 89.4 fL (ref 78.0–100.0)
Platelets: 340 10*3/uL (ref 150–400)
RDW: 15.1 % (ref 11.5–15.5)
WBC: 7.7 10*3/uL (ref 4.0–10.5)

## 2011-07-10 NOTE — Progress Notes (Signed)
1 Day Post-Op  Subjective: Doing well today s/p ERCP with sphincterotomy.  No nausea/vomiting.  Mild tenderness at umbilical incision, otherwise doing well.  Tolerating PO.  Objective: Vital signs in last 24 hours: Temp:  [98.1 F (36.7 C)-99.7 F (37.6 C)] 98.6 F (37 C) (02/05 0520) Pulse Rate:  [75-102] 88  (02/05 0520) Resp:  [12-20] 18  (02/05 0520) BP: (116-174)/(52-94) 119/60 mmHg (02/05 0520) SpO2:  [97 %-100 %] 99 % (02/05 0520) Last BM Date: 06/30/11  Intake/Output this shift:    Physical Exam: BP 119/60  Pulse 88  Temp(Src) 98.6 F (37 C) (Oral)  Resp 18  Ht 5\' 11"  (1.803 m)  Wt 354 lb 15.1 oz (161 kg)  BMI 49.50 kg/m2  SpO2 99%  LMP 07/09/2011  Breastfeeding? Unknown Gen: NAD CV: RRR Resp: CTABL Abd: Soft, nondistended, mild tenderness at prior incision site.  No drainage/erythema. Ext: No edema, 2+ pulses Labs: CBC  Basename 07/10/11 0500 07/09/11 0645  WBC 7.7 8.9  HGB 11.7* 11.9*  HCT 34.7* 36.3  PLT 340 334   BMET  Basename 07/09/11 0645 07/08/11 1408  NA 141 141  K 3.4* 3.5  CL 107 105  CO2 25 25  GLUCOSE 101* 109*  BUN 5* 4*  CREATININE 0.74 0.79  CALCIUM 9.6 9.7   LFT  Basename 07/10/11 0500 07/09/11 0645  PROT 6.9 --  ALBUMIN 3.2* --  AST 41* --  ALT 159* --  ALKPHOS 237* --  BILITOT 0.4 --  BILIDIR 0.1 --  IBILI 0.3 --  LIPASE -- 230*   PT/INR No results found for this basename: LABPROT:2,INR:2 in the last 72 hours ABG No results found for this basename: PHART:2,PCO2:2,PO2:2,HCO3:2 in the last 72 hours  Studies/Results: Ct Abdomen W Contrast  07/08/2011  *RADIOLOGY REPORT*  Clinical Data:  5 days status post cholecystectomy.  Worsening diffuse abdominal pain.  CT ABDOMEN WITH CONTRAST  Technique:  Multidetector CT imaging of the abdomen was performed using the standard protocol following bolus administration of intravenous contrast.  Contrast:  100 ml Omnipaque-300 and oral contrast  Comparison:   None  Findings:   Expected postoperative changes from recent cholecystectomy are seen. Mild prominence of extrahepatic common bile ducts noted.  No abnormal fluid collections are seen in the gallbladder fossa were elsewhere within the abdomen.  The abdominal parenchymal organs are normal in appearance.  No soft tissue masses or lymphadenopathy identified.  No evidence of dilated bowel loops or focal inflammatory process.  IMPRESSION: Expected postoperative changes from recent cholecystectomy.  No abnormal fluid collections or other acute findings identified within the abdomen.  Original Report Authenticated By: Danae Orleans, M.D.   Dg Ercp With Sphincterotomy  07/09/2011  *RADIOLOGY REPORT*  Clinical Data: Possible distal duct obstruction post cholecystectomy.  ERCP  Comparison:  07/05/2011  Technique:  Multiple spot images obtained with the fluoroscopic device and submitted for interpretation post-procedure.  ERCP was performed by Dr.  Arlyce Dice.  Findings: This is of 13 images document endoscopic catheterization and opacification of the common bile duct with passage of a balloon stone extractor through the common duct.  No persistent filling defects are identified.  The intrahepatic ducts are incompletely visualized.  IMPRESSION:  1.  ERCP with passage of balloon stone extractor.  These images were submitted for radiologic interpretation only. Please see the procedural report for the amount of contrast and the fluoroscopy time utilized.  Original Report Authenticated By: Osa Craver, M.D.   Assessment/Plan: Enzymes have improved s/p  ERCP with sphincterotomy.  Appears to have had pancreatitis secondary to either sphincter dysfunction or a passed CBD stone.  Tolerating PO.  Plan per GI.  CBC/CMET ordered for tomorrow AM in case GI would like patient kept overnight.  Regular diet  SCDs for VTE PPx  Dispo is per GI recs  Abubakar Crispo MD 07/10/2011 8:19 AM

## 2011-07-10 NOTE — Discharge Summary (Signed)
Chaselyn Nanney, MD, MPH, FACS Pager: [phone]  

## 2011-07-10 NOTE — Discharge Summary (Signed)
Physician Discharge Summary  Patient ID: Andrea Morton MRN: [id number] DOB/AGE: 03-03-1991 21 y.o.  Admit date: 07/08/2011 Discharge date: 07/10/2011  Admission Diagnoses:  Discharge Diagnoses:  -Gallstone pancreatitis -Choledocholithiasis -Obesity -Tobacco use  Discharged Condition: good  Hospital Course: Patient is a 21 yo F who was recently admitted for a lap chole.  Patient had retained common bile duct stone with no ERCP performed.  Patient was readmitted with RUQ pain and recurrent elevation of LFTs and lipase.  She was given IVF and pain control overnight and was taken to endoscopy on 07/09/11.  Sphincterotomy was performed and a stent was placed.  No stone was identified.  The following day her pain was well controlled and her LFTs were trending down.  She was felt stable for discharge at that time.  Consults: GI  Significant Diagnostic Studies: endoscopy: ERCP: Dilated CBD, sphincterotomy performed and stent left in place.  Treatments: procedures: ERCP with biliary stent placement  Discharge Exam: Blood pressure 112/50, pulse 89, temperature 98.4 F (36.9 C), temperature source Oral, resp. rate 16, height 5\' 11"  (1.803 m), weight 354 lb 15.1 oz (161 kg), last menstrual period 07/09/2011, SpO2 100.00%, unknown if currently breastfeeding. General appearance: alert, cooperative, appears stated age and moderately obese Neck: no adenopathy, no JVD and supple, symmetrical, trachea midline Resp: clear to auscultation bilaterally Cardio: regular rate and rhythm, S1, S2 normal, no murmur, click, rub or gallop GI: abnormal findings:  incision site superior to umbilicus healing well.  Otherwise pt had minimal discomfort and no distension at time of discharge Extremities: extremities normal, atraumatic, no cyanosis or edema Pulses: 2+ and symmetric Skin: Skin color, texture, turgor normal. No rashes or lesions  Disposition: Home or Self Care  Discharge Orders    Future  Appointments: Provider: Department: Dept Phone: Center:   07/16/2011 1:30 PM Ardyth Gal, MD [facility] [phone] [facility]   07/17/2011 2:30 PM [facility] [phone] None   07/31/2011 10:45 AM Ernestene Mention, MD [facility] [phone] None     Future Orders Please Complete By Expires   Diet - low sodium heart healthy      Increase activity slowly      Discharge instructions      Comments:   No change from previous discharge.   No wound care      Call MD for:  temperature >100.4      Call MD for:  persistant nausea and vomiting      Call MD for:  severe uncontrolled pain      Call MD for:  redness, tenderness, or signs of infection (pain, swelling, redness, odor or green/yellow discharge around incision site)        Medication List  As of 07/10/2011 10:21 AM   STOP taking these medications         HYDROcodone-acetaminophen 5-500 MG per tablet         TAKE these medications         medroxyPROGESTERone 150 MG/ML injection   Commonly known as: DEPO-PROVERA   Inject 1 mL (150 mg total) into the muscle every 3 (three) months.      NIFEdipine 90 MG 24 hr tablet   Commonly known as: PROCARDIA XL/ADALAT-CC   Take 90 mg by mouth at bedtime.      ranitidine 150 MG tablet   Commonly known as: ZANTAC   Take 1 tablet (150 mg total) by mouth 2 (two) times daily.  valACYclovir 500 MG tablet   Commonly known as: VALTREX   Take 500 mg by mouth 2 (two) times daily as needed. for outbreaks           Follow-up Information    Follow up with [facility], MD. Call in 3 weeks.      Follow up with [facility] on 07/17/2011. (at 2:10pm)       Schedule an appointment as soon as possible for a visit with Melvia Heaps, MD. (call for follow up information)    Contact information:   [address] [facility] [address] [place] [address] [place] [address] [phone]          Signed: Majel Homer 07/10/2011, 10:21  AM

## 2011-07-10 NOTE — Progress Notes (Signed)
Agree Marlane Hirschmann, MD, MPH, FACS Pager: [phone]  

## 2011-07-10 NOTE — Progress Notes (Signed)
     [facility] Daily Rounding Note 07/10/2011, 11:56 AM  SUBJECTIVE: No belly pain.  No nausea.  Eating well  OBJECTIVE: General: obese , NAD.  Not ill appearing.  Vital signs in last 24 hours: Temp:  [98.1 F (36.7 C)-99.7 F (37.6 C)] 98.4 F (36.9 C) (02/05 0952) Pulse Rate:  [75-102] 89  (02/05 0952) Resp:  [12-20] 16  (02/05 0952) BP: (112-174)/(50-94) 112/50 mmHg (02/05 0952) SpO2:  [98 %-100 %] 100 % (02/05 0952) Last BM Date: 06/30/11  Heart: RRR Chest: Clear.  Not SOB Abdomen: Obese, NT, active BS  Extremities: no pitting edema at feet. Neuro/Psych:  Pleasant, cooperative, relaxed.  Intake/Output from previous day: 02/04 0701 - 02/05 0700 In: 3707 [P.O.:480; I.V.:3227] Out: 300 [Urine:300]  Intake/Output this shift: Total I/O In: 654 [I.V.:654] Out: -   Lab Results:  Basename 07/10/11 0500 07/09/11 0645 07/08/11 1408  WBC 7.7 8.9 10.1  HGB 11.7* 11.9* 12.3  HCT 34.7* 36.3 36.9  PLT 340 334 319   BMET  Basename 07/09/11 0645 07/08/11 1408  NA 141 141  K 3.4* 3.5  CL 107 105  CO2 25 25  GLUCOSE 101* 109*  BUN 5* 4*  CREATININE 0.74 0.79  CALCIUM 9.6 9.7   LFT  Basename 07/10/11 0500  PROT 6.9  ALBUMIN 3.2*  AST 41*  ALT 159*  ALKPHOS 237*  BILITOT 0.4  BILIDIR 0.1  IBILI 0.3   Studies/Results:  Dg Ercp With Sphincterotomy  07/09/2011  *RADIOLOGY REPORT*  Clinical Data: Possible distal duct obstruction post cholecystectomy.  ERCP  Comparison:  07/05/2011  Technique:  Multiple spot images obtained with the fluoroscopic device and submitted for interpretation post-procedure.  ERCP was performed by Dr.  Arlyce Dice.  Findings: This is of 13 images document endoscopic catheterization and opacification of the common bile duct with passage of a balloon stone extractor through the common duct.  No persistent filling defects are identified.  The intrahepatic ducts are incompletely visualized.  IMPRESSION:  1.  ERCP with passage of balloon stone  extractor.  These images were submitted for radiologic interpretation only. Please see the procedural report for the amount of contrast and the fluoroscopy time utilized.  Original Report Authenticated By: Osa Craver, M.D.    ASSESMENT: 1.  ERCP with placement of stent into pancreatic duct and sphincterotomy 07/09/11. CBD was dilated.  Felt to have passed a CBD stone vs biliary stenosis.  Doing well post ERCP. LFTs improved.  2.  Lap Chole last week.  Preop biliary pancreatis, not severe and resolved quickly. 3.  Morbid obesity.   PLAN: 1.  Xray of abdomen at [facility] office arranged for [facility] Feb 7th.  See AVS (after visit summary).  Pt aware of plans.   LOS: 2 days   Jennye Moccasin  07/10/2011, 11:56 AM Pager: [phone]

## 2011-07-10 NOTE — Discharge Planning (Signed)
Patient discharged home in stable condition. Verbalizes understanding of all discharge instructions, including home medications and follow up appointments. 

## 2011-07-10 NOTE — Progress Notes (Signed)
Agree with above note and plan as per Jennye Moccasin

## 2011-07-11 ENCOUNTER — Emergency Department (HOSPITAL_COMMUNITY): Payer: Medicaid Other

## 2011-07-11 ENCOUNTER — Encounter (HOSPITAL_COMMUNITY): Payer: Self-pay | Admitting: Emergency Medicine

## 2011-07-11 ENCOUNTER — Emergency Department (HOSPITAL_COMMUNITY)
Admission: EM | Admit: 2011-07-11 | Discharge: 2011-07-11 | Disposition: A | Discharge status: Home / Self Care | Payer: Medicaid Other | Attending: Emergency Medicine | Admitting: Emergency Medicine

## 2011-07-11 DIAGNOSIS — E669 Obesity, unspecified: Secondary | ICD-10-CM | POA: Insufficient documentation

## 2011-07-11 DIAGNOSIS — I10 Essential (primary) hypertension: Secondary | ICD-10-CM | POA: Insufficient documentation

## 2011-07-11 DIAGNOSIS — Z79899 Other long term (current) drug therapy: Secondary | ICD-10-CM | POA: Insufficient documentation

## 2011-07-11 DIAGNOSIS — R109 Unspecified abdominal pain: Secondary | ICD-10-CM | POA: Insufficient documentation

## 2011-07-11 LAB — COMPREHENSIVE METABOLIC PANEL
ALT: 125 U/L — ABNORMAL HIGH (ref 0–35)
AST: 21 U/L (ref 0–37)
Albumin: 3.6 g/dL (ref 3.5–5.2)
Alkaline Phosphatase: 221 U/L — ABNORMAL HIGH (ref 39–117)
BUN: 8 mg/dL (ref 6–23)
CO2: 27 mEq/L (ref 19–32)
Calcium: 9.7 mg/dL (ref 8.4–10.5)
Chloride: 104 mEq/L (ref 96–112)
Creatinine, Ser: 0.9 mg/dL (ref 0.50–1.10)
GFR calc Af Amer: >90 mL/min (ref >90)
GFR calc non Af Amer: >90 mL/min (ref >90)
Glucose, Bld: 112 mg/dL — ABNORMAL HIGH (ref 70–99)
Potassium: 3.4 mEq/L — ABNORMAL LOW (ref 3.5–5.1)
Sodium: 140 mEq/L (ref 135–145)
Total Bilirubin: 0.4 mg/dL (ref 0.3–1.2)
Total Protein: 7.3 g/dL (ref 6.0–8.3)

## 2011-07-11 LAB — CBC
HCT: 35.7 % — ABNORMAL LOW (ref 36.0–46.0)
Hemoglobin: 12 g/dL (ref 12.0–15.0)
MCH: 30.2 pg (ref 26.0–34.0)
MCHC: 33.6 g/dL (ref 30.0–36.0)
MCV: 89.9 fL (ref 78.0–100.0)
Platelets: 352 10*3/uL (ref 150–400)
RBC: 3.97 MIL/uL (ref 3.87–5.11)
RDW: 15.2 % (ref 11.5–15.5)
WBC: 8.7 10*3/uL (ref 4.0–10.5)

## 2011-07-11 LAB — URINALYSIS, DIPSTICK ONLY
Bilirubin Urine: NEGATIVE
Glucose, UA: NEGATIVE mg/dL
Ketones, ur: NEGATIVE mg/dL
Nitrite: NEGATIVE
Protein, ur: NEGATIVE mg/dL
Specific Gravity, Urine: 1.018 (ref 1.005–1.030)
Urobilinogen, UA: 1 mg/dL (ref 0.0–1.0)
pH: 6 (ref 5.0–8.0)

## 2011-07-11 LAB — LIPASE, BLOOD: Lipase: 34 U/L (ref 11–59)

## 2011-07-11 MED ORDER — OXYCODONE-ACETAMINOPHEN 5-325 MG PO TABS
1.0000 | ORAL_TABLET | Freq: Once | ORAL | Status: DC
  Filled 2011-07-11: qty 1

## 2011-07-11 MED ORDER — IBUPROFEN 800 MG PO TABS
ORAL_TABLET | ORAL | Status: AC
  Filled 2011-07-11: qty 1

## 2011-07-11 NOTE — ED Provider Notes (Signed)
History    21yo f s/p recent lap chole with retained common bile duct stone. Returned and had subsequent ERCP with placement of stent into pancreatic duct and sphincterotomy 07/09/11 by Dr Arlyce Dice, [facility]. DC'd 2/5 and returning now because of continued pain. Says her pain really hasn't changed from previous but that this is a much better. No fever or chills. Nausea but no vomiting. No urinary complaints.   CSN: [id number]  Arrival date & time 07/11/11  0257   First MD Initiated Contact with Patient 07/11/11 [phone]      Chief Complaint  Patient presents with  . Abdominal Pain    (Consider location/radiation/quality/duration/timing/severity/associated sxs/prior treatment) HPI  Past Medical History  Diagnosis Date  . Herpes, genital   . Hypertension   . Pregnancy induced hypertension   . Obesity   . Preeclampsia, severe 2012    lead to C section.    Past Surgical History  Procedure Date  . Cesarean section 05/03/2011    Procedure: CESAREAN SECTION;  Surgeon: Janine Limbo, MD;  Location: [facility];  Service: Gynecology;  Laterality: N/A;  . Cholecystectomy 07/05/2011    Procedure: LAPAROSCOPIC CHOLECYSTECTOMY WITH INTRAOPERATIVE CHOLANGIOGRAM;  Surgeon: Ernestene Mention, MD;  Location: [facility] OR;  Service: General;  Laterality: N/A;  . Ercp 07/09/2011    Procedure: ENDOSCOPIC RETROGRADE CHOLANGIOPANCREATOGRAPHY (ERCP);  Surgeon: Louis Meckel, MD;  Location: [facility] OR;  Service: Endoscopy;  Laterality: N/A;    Family History  Problem Relation Age of Onset  . Diabetes Mother   . Obesity Mother   . Asthma Sister   . Obesity Sister   . Diabetes Maternal Aunt   . Obesity Maternal Aunt   . Diabetes Maternal Grandmother   . Anesthesia problems Neg Hx     History  Substance Use Topics  . Smoking status: Former Smoker -- 2 years    Types: Cigarettes    Quit date: 10/13/2010  . Smokeless tobacco: Never Used  . Alcohol Use: No    OB History    Grav Para Term Preterm Abortions  TAB SAB Ect Mult Living   1 1 0 1 0 0 0 0 0 1       Review of Systems   Review of symptoms negative unless otherwise noted in HPI.   Allergies  Review of patient's allergies indicates no known allergies.  Home Medications   Current Outpatient Rx  Name Route Sig Dispense Refill  . MEDROXYPROGESTERONE ACETATE 150 MG/ML IM SUSP Intramuscular Inject 1 mL (150 mg total) into the muscle every 3 (three) months. 1 mL 3    Give first dose prior to discharge home.  Marland Kitchen NIFEDIPINE ER OSMOTIC 90 MG PO TB24 Oral Take 90 mg by mouth at bedtime.    Marland Kitchen RANITIDINE HCL 150 MG PO TABS Oral Take 1 tablet (150 mg total) by mouth 2 (two) times daily. 60 tablet 1  . VALACYCLOVIR HCL 500 MG PO TABS Oral Take 500 mg by mouth 2 (two) times daily as needed. for outbreaks      BP 141/88  Pulse 91  Temp(Src) 98.3 F (36.8 C) (Oral)  Resp 20  SpO2 98%  LMP 07/09/2011  Physical Exam  Nursing note and vitals reviewed. Constitutional: She appears well-developed and well-nourished. No distress.       Laying in bed. No acute distress. Obese.  HENT:  Head: Normocephalic and atraumatic.  Eyes: Conjunctivae are normal. Right eye exhibits no discharge. Left eye exhibits no discharge.  Neck: Neck supple.  Cardiovascular: Normal rate, regular rhythm and normal heart sounds.  Exam reveals no gallop and no friction rub.   No murmur heard. Pulmonary/Chest: Effort normal and breath sounds normal. No respiratory distress.  Abdominal: Soft. She exhibits no distension. There is tenderness.       Mild diffuse tenderness, slightly worse in the epigastrium. Tissue adhesive in place over laparoscopy incisions. These looked appropriate for recent procedure. There is no distention.  Genitourinary:       No costovertebral angle tenderness.  Musculoskeletal: She exhibits no edema and no tenderness.  Neurological: She is alert.  Skin: Skin is warm and dry.  Psychiatric: She has a normal mood and affect. Her behavior is normal.  Thought content normal.    ED Course  Procedures (including critical care time)  Labs Reviewed  COMPREHENSIVE METABOLIC PANEL - Abnormal; Notable for the following:    Potassium 3.4 (*)    Glucose, Bld 112 (*)    ALT 125 (*)    Alkaline Phosphatase 221 (*)    All other components within normal limits  URINALYSIS, DIPSTICK ONLY - Abnormal; Notable for the following:    Hgb urine dipstick MODERATE (*)    Leukocytes, UA MODERATE (*)    All other components within normal limits  CBC - Abnormal; Notable for the following:    HCT 35.7 (*)    All other components within normal limits  LIPASE, BLOOD   Dg Abd 1 View  07/11/2011  *RADIOLOGY REPORT*  Clinical Data: Abdominal pain  ABDOMEN - 1 VIEW  Comparison: 07/08/2011 CT  Findings: Surgical clips right upper quadrant. A stent is positioned over the left upper quadrant.  This likely migrated into the jejunal loops.  Hemidiaphragms are excluded from the image. Contrast opacifies the colon in keeping with contrast from recent CT scan.  Bowel gas pattern is nonobstructive.  Organ outlines normal where seen.  No acute osseous abnormality.  IMPRESSION: Nonobstructive bowel gas pattern.  A stent is positioned over the left upper quadrant.  This likely migrated into the jejunal loops.  Original Report Authenticated By: Waneta Martins, M.D.   Dg Ercp With Sphincterotomy  07/09/2011  *RADIOLOGY REPORT*  Clinical Data: Possible distal duct obstruction post cholecystectomy.  ERCP  Comparison:  07/05/2011  Technique:  Multiple spot images obtained with the fluoroscopic device and submitted for interpretation post-procedure.  ERCP was performed by Dr.  Arlyce Dice.  Findings: This is of 13 images document endoscopic catheterization and opacification of the common bile duct with passage of a balloon stone extractor through the common duct.  No persistent filling defects are identified.  The intrahepatic ducts are incompletely visualized.  IMPRESSION:  1.  ERCP with  passage of balloon stone extractor.  These images were submitted for radiologic interpretation only. Please see the procedural report for the amount of contrast and the fluoroscopy time utilized.  Original Report Authenticated By: Osa Craver, M.D.   4:50 AM Discussed case with Dr. Juanda Chance, GI. Given the LFTs are trending down, lipase normalized and only mild tenderness on exam that from a GI perspective the patient can be discharged. Followup in the office this week.  1. Abdominal  pain, other specified site       MDM  21 year old female with abdominal pain. Labs repeated and have been trending down appropriately.: Mild tenderness on examination. Given recent procedure and hospitalization though will discuss with GI. Feel that pt can be appropriately be evaluated as outpt.  Raeford Razor, MD 07/18/11 0230

## 2011-07-11 NOTE — ED Notes (Signed)
Patient transported to X-ray 

## 2011-07-11 NOTE — ED Notes (Signed)
Went to give pt percocet as ordered, pt says "thats not going to work" and reports taking 2 percocets within the past 3 hours.  Spoke with Dr. Juleen China and received verbal order for ibuprofen.  Went in to administer tablet and pt says "the percocet doesn't work, why would that?" and laid the tablet on the table, refuses at this time. Pt ambulatory to restroom

## 2011-07-11 NOTE — ED Notes (Signed)
PT. REPORTS PERSISTENT UPPER ABDOMINAL PAIN ONSET YESTERDAY , RECENT SURGERY - CHOLECYSTECTOMY AND DISCHARGE FROM HOSPITAL YESTERDAY , DENIES NAUSEA OR VOMITTING .

## 2011-07-16 ENCOUNTER — Inpatient Hospital Stay: Payer: Medicaid Other | Admitting: Family Medicine

## 2011-07-16 ENCOUNTER — Telehealth: Payer: Self-pay

## 2011-07-16 NOTE — Telephone Encounter (Signed)
Spoke with pt and she is aware.

## 2011-07-16 NOTE — Telephone Encounter (Signed)
Message copied by Michele Mcalpine on Mon Jul 16, 2011  2:49 PM ------      Message from: Melvia Heaps D      Created: Mon Jul 16, 2011  2:34 PM      Regarding: RE: Abdominal x-ray       Thanks for the reminder.  Please inform pt that nothing more needs to be done.      ----- Message -----         From: Lily Lovings, RN         Sent: 07/16/2011   2:26 PM           To: Louis Meckel, MD      Subject: Abdominal x-ray                                          Dr. Arlyce Dice,            Just wanted to touch base with you and see if you received the results of the xray that was done on this pt. 07/11/11...Marland KitchenMarland KitchenPlease let me know.            Bonita Quin

## 2011-07-17 ENCOUNTER — Ambulatory Visit (INDEPENDENT_AMBULATORY_CARE_PROVIDER_SITE_OTHER): Payer: Medicaid Other | Admitting: General Surgery

## 2011-07-17 ENCOUNTER — Encounter (INDEPENDENT_AMBULATORY_CARE_PROVIDER_SITE_OTHER): Payer: Self-pay | Admitting: General Surgery

## 2011-07-17 VITALS — BP 144/88 | HR 72 | Temp 97.8°F | Resp 18 | Ht 71.0 in | Wt 351.6 lb

## 2011-07-17 DIAGNOSIS — Z9889 Other specified postprocedural states: Secondary | ICD-10-CM

## 2011-07-17 DIAGNOSIS — K8010 Calculus of gallbladder with chronic cholecystitis without obstruction: Secondary | ICD-10-CM

## 2011-07-17 NOTE — Patient Instructions (Signed)
Follow up as needed

## 2011-07-17 NOTE — Progress Notes (Signed)
TIOMBE TOMEO [place] March 19, 1991 [id number] 07/17/2011   Andrea Morton is a 21 y.o. female who had a laparoscopic cholecystectomy with intraoperative cholangiogram by Dr. Derrell Lolling.  The pathology report confirmed chronic cholecystitis and cholelithiasis.  The patient reports that they are feeling well with normal bowel movements and good appetite.  The pre-operative symptoms of abdominal pain, nausea, and vomiting have resolved.    Physical examination - Incisions appear well-healed with no sign of infection or bleeding.   Abdomen - soft, non-tender  Impression:  s/p laparoscopic cholecystectomy  Plan:  She may resume a regular diet and full activity.  She may follow-up on a PRN basis.

## 2011-07-31 ENCOUNTER — Inpatient Hospital Stay: Payer: Medicaid Other | Admitting: Family Medicine

## 2011-07-31 ENCOUNTER — Encounter (INDEPENDENT_AMBULATORY_CARE_PROVIDER_SITE_OTHER): Payer: Medicaid Other | Admitting: General Surgery

## 2011-08-14 ENCOUNTER — Ambulatory Visit (INDEPENDENT_AMBULATORY_CARE_PROVIDER_SITE_OTHER): Payer: Medicaid Other | Admitting: *Deleted

## 2011-08-14 DIAGNOSIS — Z309 Encounter for contraceptive management, unspecified: Secondary | ICD-10-CM

## 2011-08-14 MED ORDER — MEDROXYPROGESTERONE ACETATE 150 MG/ML IM SUSP
150.0000 mg | Freq: Once | INTRAMUSCULAR | Status: AC
  Administered 2011-08-14: 150 mg via INTRAMUSCULAR

## 2011-08-14 NOTE — Progress Notes (Signed)
Patient received depo in [facility]  After birth of baby and before discharge. She is late receiving injection  now.  Urine pregnancy test  performed today and is negative. Patient advised   to use extra protection for next 7 days.

## 2011-08-28 ENCOUNTER — Encounter: Payer: Self-pay | Admitting: Family Medicine

## 2011-08-28 ENCOUNTER — Ambulatory Visit (INDEPENDENT_AMBULATORY_CARE_PROVIDER_SITE_OTHER): Payer: Medicaid Other | Admitting: Family Medicine

## 2011-08-28 VITALS — BP 145/78 | HR 67 | Temp 98.8°F | Ht 71.0 in | Wt 355.0 lb

## 2011-08-28 DIAGNOSIS — L989 Disorder of the skin and subcutaneous tissue, unspecified: Secondary | ICD-10-CM

## 2011-08-28 NOTE — Assessment & Plan Note (Signed)
Worrisome for HSV infection. We'll obtain culture. Discharge patient to apply antibiotic ointment and to keep covered.

## 2011-08-28 NOTE — Progress Notes (Signed)
  Subjective:    Patient ID: Andrea Morton, female    DOB: 1990/08/13, 21 y.o.   MRN: [id number]  HPI Worsening lesion on the left forearm since Sunday. Initially performed as raised small blisters which broke open and has been weeping clear fluid since that time. Does have some burning-type pain. Has history of genital herpes. Mild erythema around the lesion. Has no other lesions similar to this. Nothing has improved or worsens the lesions.   Review of Systems  Constitutional: Negative for fever and chills.  Skin: Negative for color change, pallor and rash.  Neurological: Negative for numbness and headaches.       Objective:   Physical Exam  Constitutional: She is oriented to person, place, and time. She appears well-developed and well-nourished.  Neurological: She is alert and oriented to person, place, and time.  Skin:       1 cm slightly raised lesion weeping clear fluid with peripheral small clear papule  Psychiatric: She has a normal mood and affect. Her behavior is normal. Judgment and thought content normal.      Assessment & Plan:

## 2011-11-07 ENCOUNTER — Ambulatory Visit (INDEPENDENT_AMBULATORY_CARE_PROVIDER_SITE_OTHER): Payer: Medicaid Other | Admitting: *Deleted

## 2011-11-07 DIAGNOSIS — Z309 Encounter for contraceptive management, unspecified: Secondary | ICD-10-CM

## 2011-11-07 MED ORDER — MEDROXYPROGESTERONE ACETATE 150 MG/ML IM SUSP
150.0000 mg | Freq: Once | INTRAMUSCULAR | Status: AC
  Administered 2011-11-07: 150 mg via INTRAMUSCULAR

## 2011-11-07 NOTE — Progress Notes (Signed)
Advised patient of need for annual exam . Appointment scheduled.

## 2011-11-26 ENCOUNTER — Encounter: Payer: Medicaid Other | Admitting: Family Medicine

## 2011-12-02 ENCOUNTER — Encounter (HOSPITAL_COMMUNITY): Payer: Self-pay | Admitting: *Deleted

## 2011-12-02 ENCOUNTER — Emergency Department (HOSPITAL_COMMUNITY)
Admission: EM | Admit: 2011-12-02 | Discharge: 2011-12-02 | Disposition: A | Discharge status: Home / Self Care | Payer: Medicaid Other | Source: Home / Self Care | Attending: Family Medicine | Admitting: Family Medicine

## 2011-12-02 DIAGNOSIS — B354 Tinea corporis: Secondary | ICD-10-CM

## 2011-12-02 HISTORY — DX: Acute pancreatitis without necrosis or infection, unspecified: K85.90

## 2011-12-02 MED ORDER — KETOCONAZOLE 2 % EX CREA: TOPICAL_CREAM | Freq: Two times a day (BID) | CUTANEOUS | Status: DC

## 2011-12-02 NOTE — ED Notes (Signed)
Pt with round rash right posterior leg onset x two weeks -

## 2011-12-02 NOTE — ED Provider Notes (Signed)
History     CSN: [id number]  Arrival date & time 12/02/11  1254   First MD Initiated Contact with Patient 12/02/11 1503      Chief Complaint  Patient presents with  . Rash    (Consider location/radiation/quality/duration/timing/severity/associated sxs/prior treatment) Patient is a 21 y.o. female presenting with rash. The history is provided by the patient.  Rash  This is a new problem. Episode onset: a week ago. The problem has not changed since onset.Associated with: pt's daughter has "tinea" and pt thinks she has it now too. There has been no fever. The rash is present on the left lower leg. The pain is at a severity of 4/10. The pain is mild. The pain has been constant since onset. Associated symptoms include itching. Treatments tried: used daughter's ketoconazole cream multiple times a day until it was gone, applied bleach and alcohol frequently. The treatment provided no relief.    Past Medical History  Diagnosis Date  . Herpes, genital   . Hypertension   . Pregnancy induced hypertension   . Obesity   . Preeclampsia, severe 2012    lead to C section.  . Pancreatitis     Past Surgical History  Procedure Date  . Cholecystectomy 07/05/2011    Procedure: LAPAROSCOPIC CHOLECYSTECTOMY WITH INTRAOPERATIVE CHOLANGIOGRAM;  Surgeon: Ernestene Mention, MD;  Location: [facility] OR;  Service: General;  Laterality: N/A;  . Ercp 07/09/2011    Procedure: ENDOSCOPIC RETROGRADE CHOLANGIOPANCREATOGRAPHY (ERCP);  Surgeon: Louis Meckel, MD;  Location: [facility] OR;  Service: Endoscopy;  Laterality: N/A;  . Cesarean section 05/03/2011    Procedure: CESAREAN SECTION;  Surgeon: Janine Limbo, MD;  Location: [facility];  Service: Gynecology;  Laterality: N/A;    Family History  Problem Relation Age of Onset  . Diabetes Mother   . Obesity Mother   . Asthma Sister   . Obesity Sister   . Diabetes Maternal Aunt   . Obesity Maternal Aunt   . Diabetes Maternal Grandmother   . Anesthesia problems Neg Hx      History  Substance Use Topics  . Smoking status: Former Smoker -- 2 years    Types: Cigarettes    Quit date: 10/13/2010  . Smokeless tobacco: Never Used  . Alcohol Use: No    OB History    Grav Para Term Preterm Abortions TAB SAB Ect Mult Living   1 1 0 1 0 0 0 0 0 1       Review of Systems  Constitutional: Negative for fever and chills.  Skin: Positive for itching and rash.    Allergies  Review of patient's allergies indicates no known allergies.  Home Medications   Current Outpatient Rx  Name Route Sig Dispense Refill  . KETOCONAZOLE 2 % EX CREA Topical Apply topically daily. Daughters medication    . KETOCONAZOLE 2 % EX CREA Topical Apply topically 2 (two) times daily. 15 g 0  . MEDROXYPROGESTERONE ACETATE 150 MG/ML IM SUSP Intramuscular Inject 1 mL (150 mg total) into the muscle every 3 (three) months. 1 mL 3    Give first dose prior to discharge home.  Marland Kitchen NIFEDIPINE ER OSMOTIC 90 MG PO TB24 Oral Take 90 mg by mouth at bedtime.    Marland Kitchen RANITIDINE HCL 150 MG PO TABS Oral Take 1 tablet (150 mg total) by mouth 2 (two) times daily. 60 tablet 1  . VALACYCLOVIR HCL 500 MG PO TABS Oral Take 500 mg by mouth 2 (two) times daily as needed. for outbreaks  BP 137/75  Pulse 83  Temp 98.8 F (37.1 C) (Oral)  Resp 18  SpO2 98%  Breastfeeding? Unknown  Physical Exam  Constitutional: She appears well-developed and well-nourished. No distress.       Obese   Pulmonary/Chest: Effort normal.  Skin: Skin is warm and dry. Rash noted.       ED Course  Procedures (including critical care time)  Labs Reviewed - No data to display No results found.   1. Tinea corporis       MDM  Rash c/w ringworm given hx, but does not exactly look like ringworm after damage to skin from bleach and alcohol.  Instructed pt to stop applying bleach and alcohol to her skin, use ketoconazole as prescribed only, and to f/u with family practice to make sure it is healing appropriately. No  evidence of secondary bacterial infection at this time.         Cathlyn Parsons, NP 12/02/11 1521

## 2011-12-02 NOTE — Discharge Instructions (Signed)
Follow up with family practice in 2 weeks to have them recheck your skin.  Do NOT use bleach or alcohol on your skin.  Use the ketoconazole cream twice a day only.  You can keep the area covered.    Ringworm, Body [Tinea Corporis] Ringworm is a fungal infection of the skin and hair. Another name for this problem is Tinea Corporis. It has nothing to do with worms. A fungus is an organism that lives on dead cells (the outer layer of skin). It can involve the entire body. It can spread from infected pets. Tinea corporis can be a problem in wrestlers who may get the infection form other players/opponents, equipment and mats. DIAGNOSIS  A skin scraping can be obtained from the affected area and by looking for fungus under the microscope. This is called a KOH examination.  HOME CARE INSTRUCTIONS   Ringworm may be treated with a topical antifungal cream, ointment, or oral medications.   If you are using a cream or ointment, wash infected skin. Dry it completely before application.   Scrub the skin with a buff puff or abrasive sponge using a shampoo with ketoconazole to remove dead skin and help treat the ringworm.   Have your pet treated by your veterinarian if it has the same infection.  SEEK MEDICAL CARE IF:   Your ringworm patch (fungus) continues to spread after 7 days of treatment.   Your rash is not gone in 4 weeks. Fungal infections are slow to respond to treatment. Some redness (erythema) may remain for several weeks after the fungus is gone.   The area becomes red, warm, tender, and swollen beyond the patch. This may be a secondary bacterial (germ) infection.   You have a fever.  Document Released: 05/18/2000 Document Revised: 05/10/2011 Document Reviewed: 10/29/2008 [facility] Patient Information 2012 [place], [place].

## 2011-12-03 NOTE — ED Provider Notes (Signed)
Medical screening examination/treatment/procedure(s) were performed by resident physician or non-physician practitioner and as supervising physician I was immediately available for consultation/collaboration.   Gohan Collister DOUGLAS MD.    Jamal Haskin D Dreshon Proffit, MD 12/03/11 2026 

## 2012-01-09 ENCOUNTER — Emergency Department (HOSPITAL_COMMUNITY)
Admission: EM | Admit: 2012-01-09 | Discharge: 2012-01-09 | Disposition: A | Discharge status: Home / Self Care | Payer: Medicaid Other | Source: Home / Self Care | Attending: Emergency Medicine | Admitting: Emergency Medicine

## 2012-01-09 ENCOUNTER — Encounter (HOSPITAL_COMMUNITY): Payer: Self-pay

## 2012-01-09 DIAGNOSIS — H60399 Other infective otitis externa, unspecified ear: Secondary | ICD-10-CM

## 2012-01-09 DIAGNOSIS — H6090 Unspecified otitis externa, unspecified ear: Secondary | ICD-10-CM

## 2012-01-09 MED ORDER — NEOMYCIN-POLYMYXIN-HC 3.5-10000-1 OT SUSP: 3.0000 [drp] | Freq: Four times a day (QID) | OTIC | Status: AC

## 2012-01-09 NOTE — ED Notes (Signed)
C/o bilateral ear pain for 1 week.  Denies fever or cold sx.

## 2012-01-09 NOTE — ED Provider Notes (Signed)
Chief Complaint  Patient presents with  . Otalgia    History of Present Illness:   The patient is a 21 year old female who has had a one-week history of bilateral ear pain, worse on the left than the right. She denies any drainage or difficulty hearing. She's had no fever, chills, headache, or URI symptoms. She denies any prior history of ear problems. She has not been swimming or gotten any water in her ears.  Review of Systems:  Other than noted above, the patient denies any of the following symptoms: Systemic:  No fevers, chills, sweats, weight loss or gain, fatigue, or tiredness. Eye:  No redness, pain, discharge, itching, blurred vision, or diplopia. ENT:  No headache, nasal congestion, sneezing, itching, epistaxis, ear pain, congestion, decreased hearing, ringing in ears, vertigo, or tinnitus.  No oral lesions, sore throat, pain on swallowing, or hoarseness. Neck:  No mass, tenderness or adenopathy. Lungs:  No coughing, wheezing, or shortness of breath. Skin:  No rash or itching.  PMFSH:  Past medical history, family history, social history, meds, and allergies were reviewed.  Physical Exam:   Vital signs:  BP 127/59  Pulse 94  Temp 98.3 F (36.8 C) (Oral)  Resp 18  SpO2 97% General:  Alert and oriented.  In no distress.  Skin warm and dry. Eye:  PERRL, full EOMs, lids and conjunctiva normal.   ENT:  Ear canals were erythematous, worse on the left than the right, TMs are normal. The canals are widely patent and there was no debris or exudate present.  Nasal mucosa not congested and without drainage.  Mucous membranes moist, no oral lesions, normal dentition, pharynx clear.  No cranial or facial pain to palplation. Neck:  Supple, full ROM.  No adenopathy, tenderness or mass.  Thyroid normal. Lungs:  Breath sounds clear and equal bilaterally.  No wheezes, rales or rhonchi. Heart:  Rhythm regular, without extrasystoles.  No gallops or murmers. Skin:  Clear, warm and  dry.  Assessment:  The encounter diagnosis was Otitis externa.  Plan:   1.  The following meds were prescribed:   New Prescriptions   NEOMYCIN-POLYMYXIN-HYDROCORTISONE (CORTISPORIN) 3.5-10000-1 OTIC SUSPENSION    Place 3 drops into both ears 4 (four) times daily.   2.  The patient was instructed in symptomatic care and handouts were given. 3.  The patient was told to return if becoming worse in any way, if no better in 3 or 4 days, and given some red flag symptoms that would indicate earlier return.  Follow up:  The patient was told to follow up here if no better in a week.       Reuben Likes, MD 01/09/12 2005

## 2012-01-13 ENCOUNTER — Encounter (HOSPITAL_COMMUNITY): Payer: Self-pay

## 2012-01-13 ENCOUNTER — Emergency Department (HOSPITAL_COMMUNITY)
Admission: EM | Admit: 2012-01-13 | Discharge: 2012-01-13 | Disposition: A | Discharge status: Home / Self Care | Payer: Medicaid Other | Source: Home / Self Care

## 2012-01-13 DIAGNOSIS — H9209 Otalgia, unspecified ear: Secondary | ICD-10-CM

## 2012-01-13 DIAGNOSIS — H6090 Unspecified otitis externa, unspecified ear: Secondary | ICD-10-CM

## 2012-01-13 MED ORDER — IBUPROFEN 800 MG PO TABS: 800.0000 mg | ORAL_TABLET | Freq: Three times a day (TID) | ORAL | Status: AC

## 2012-01-13 MED ORDER — AMOXICILLIN 500 MG PO CAPS: 500.0000 mg | ORAL_CAPSULE | Freq: Three times a day (TID) | ORAL | Status: AC

## 2012-01-13 NOTE — ED Notes (Signed)
Pt has bilateral ear pain and was treated here on 01/09/12 for swimmers ear and has no improvement.

## 2012-01-13 NOTE — ED Provider Notes (Signed)
History     CSN: [id number]  Arrival date & time 01/13/12  1142   None     Chief Complaint  Patient presents with  . Otalgia    (Consider location/radiation/quality/duration/timing/severity/associated sxs/prior treatment) The history is provided by the patient.  Andrea Morton is a 21 y.o. female who complains of onset of bilateral ear pain 7 days ago, evaluated at [facility] care 3 days ago and was prescribed cortisporin for ear infection.  States no relief in symptoms, pain in right ear is worse, she has noted bumps with tenderness, report pain is radiating into right jaw.    Past Medical History  Diagnosis Date  . Herpes, genital   . Hypertension   . Pregnancy induced hypertension   . Obesity   . Preeclampsia, severe 2012    lead to C section.  . Pancreatitis     Past Surgical History  Procedure Date  . Cholecystectomy 07/05/2011    Procedure: LAPAROSCOPIC CHOLECYSTECTOMY WITH INTRAOPERATIVE CHOLANGIOGRAM;  Surgeon: Ernestene Mention, MD;  Location: [facility] OR;  Service: General;  Laterality: N/A;  . Ercp 07/09/2011    Procedure: ENDOSCOPIC RETROGRADE CHOLANGIOPANCREATOGRAPHY (ERCP);  Surgeon: Louis Meckel, MD;  Location: [facility] OR;  Service: Endoscopy;  Laterality: N/A;  . Cesarean section 05/03/2011    Procedure: CESAREAN SECTION;  Surgeon: Janine Limbo, MD;  Location: [facility] ORS;  Service: Gynecology;  Laterality: N/A;    Family History  Problem Relation Age of Onset  . Diabetes Mother   . Obesity Mother   . Asthma Sister   . Obesity Sister   . Diabetes Maternal Aunt   . Obesity Maternal Aunt   . Diabetes Maternal Grandmother   . Anesthesia problems Neg Hx     History  Substance Use Topics  . Smoking status: Former Smoker -- 2 years    Types: Cigarettes    Quit date: 10/13/2010  . Smokeless tobacco: Never Used  . Alcohol Use: No    OB History    Grav Para Term Preterm Abortions TAB SAB Ect Mult Living   1 1 0 1 0 0 0 0 0 1       Review of Systems    Constitutional: Negative.   HENT: Positive for ear pain and neck pain. Negative for hearing loss, nosebleeds, congestion, sore throat, facial swelling, rhinorrhea, sneezing, mouth sores, trouble swallowing, neck stiffness, sinus pressure, tinnitus and ear discharge.        Right neck pain, bilateral ear pain,  Respiratory: Negative.   Cardiovascular: Negative.   Neurological: Negative for dizziness, light-headedness and headaches.  Hematological: Negative for adenopathy.    Allergies  Review of patient's allergies indicates no known allergies.  Home Medications   Current Outpatient Rx  Name Route Sig Dispense Refill  . KETOCONAZOLE 2 % EX CREA Topical Apply topically daily. Daughters medication    . KETOCONAZOLE 2 % EX CREA Topical Apply topically 2 (two) times daily. 15 g 0  . MEDROXYPROGESTERONE ACETATE 150 MG/ML IM SUSP Intramuscular Inject 1 mL (150 mg total) into the muscle every 3 (three) months. 1 mL 3    Give first dose prior to discharge home.  . NEOMYCIN-POLYMYXIN-HC 3.5-10000-1 OT SUSP Both Ears Place 3 drops into both ears 4 (four) times daily. 10 mL 0  . NIFEDIPINE ER OSMOTIC 90 MG PO TB24 Oral Take 90 mg by mouth at bedtime.    Marland Kitchen RANITIDINE HCL 150 MG PO TABS Oral Take 1 tablet (150 mg total) by mouth 2 (two) times  daily. 60 tablet 1  . VALACYCLOVIR HCL 500 MG PO TABS Oral Take 500 mg by mouth 2 (two) times daily as needed. for outbreaks      BP 150/79  Pulse 76  Temp 99.4 F (37.4 C) (Oral)  Resp 18  SpO2 99%  Physical Exam  Nursing note and vitals reviewed. Constitutional: She is oriented to person, place, and time. Vital signs are normal. She appears well-developed and well-nourished. She is active and cooperative.  HENT:  Head: Normocephalic.  Right Ear: Hearing normal. There is swelling and tenderness. No drainage. No mastoid tenderness. Tympanic membrane is not injected, not scarred, not perforated and not erythematous. Tympanic membrane mobility is normal.  No decreased hearing is noted.  Left Ear: Hearing normal. There is tenderness. No drainage or swelling. No mastoid tenderness. Tympanic membrane is not injected, not scarred, not perforated and not erythematous. Tympanic membrane mobility is normal. No decreased hearing is noted.  Ears:  Nose: Nose normal.  Mouth/Throat: Uvula is midline, oropharynx is clear and moist and mucous membranes are normal.       2 papular/pustular lesions right ear canal, bilateral inflammation noted inferior, tenderness on right tragus   Eyes: Conjunctivae are normal. Pupils are equal, round, and reactive to light. No scleral icterus.  Neck: Trachea normal and normal range of motion. Neck supple. No spinous process tenderness and no muscular tenderness present. No rigidity. No edema, no erythema and normal range of motion present.  Cardiovascular: Normal rate, regular rhythm, normal heart sounds and normal pulses.   Pulmonary/Chest: Effort normal and breath sounds normal.  Lymphadenopathy:       Head (right side): No submental, no submandibular, no tonsillar, no preauricular, no posterior auricular and no occipital adenopathy present.       Head (left side): No submental, no submandibular, no tonsillar, no preauricular, no posterior auricular and no occipital adenopathy present.    She has no cervical adenopathy.  Neurological: She is alert and oriented to person, place, and time. She has normal strength. No cranial nerve deficit or sensory deficit. GCS eye subscore is 4. GCS verbal subscore is 5. GCS motor subscore is 6.  Skin: Skin is warm and dry.  Psychiatric: She has a normal mood and affect. Her speech is normal and behavior is normal. Judgment and thought content normal. Cognition and memory are normal.    ED Course  Procedures (including critical care time)  Labs Reviewed - No data to display No results found.   1. Ear pain       MDM  Continue ear drops, begin ibuprofen for inflammation and pain,  start amoxicillin.  Follow up with primary care provider in 3 days if symptoms are not improved.         [name], NP 01/13/12 1330  [name], NP 01/18/12 2235

## 2012-01-17 ENCOUNTER — Ambulatory Visit: Payer: Self-pay | Admitting: Obstetrics and Gynecology

## 2012-01-29 NOTE — ED Provider Notes (Signed)
Medical screening examination/treatment/procedure(s) were performed by non-physician practitioner and as supervising physician I was immediately available for consultation/collaboration.  Luiz Blare MD   Luiz Blare, MD 01/29/12 [phone]

## 2012-02-10 ENCOUNTER — Encounter (HOSPITAL_COMMUNITY): Payer: Self-pay | Admitting: *Deleted

## 2012-02-10 ENCOUNTER — Emergency Department (HOSPITAL_COMMUNITY)
Admission: EM | Admit: 2012-02-10 | Discharge: 2012-02-10 | Disposition: A | Discharge status: Home / Self Care | Payer: Medicaid Other | Attending: Emergency Medicine | Admitting: Emergency Medicine

## 2012-02-10 DIAGNOSIS — E669 Obesity, unspecified: Secondary | ICD-10-CM | POA: Insufficient documentation

## 2012-02-10 DIAGNOSIS — Z8489 Family history of other specified conditions: Secondary | ICD-10-CM | POA: Insufficient documentation

## 2012-02-10 DIAGNOSIS — Z825 Family history of asthma and other chronic lower respiratory diseases: Secondary | ICD-10-CM | POA: Insufficient documentation

## 2012-02-10 DIAGNOSIS — I10 Essential (primary) hypertension: Secondary | ICD-10-CM | POA: Insufficient documentation

## 2012-02-10 DIAGNOSIS — H6090 Unspecified otitis externa, unspecified ear: Secondary | ICD-10-CM

## 2012-02-10 DIAGNOSIS — Z87891 Personal history of nicotine dependence: Secondary | ICD-10-CM | POA: Insufficient documentation

## 2012-02-10 DIAGNOSIS — H60399 Other infective otitis externa, unspecified ear: Secondary | ICD-10-CM | POA: Insufficient documentation

## 2012-02-10 DIAGNOSIS — Z833 Family history of diabetes mellitus: Secondary | ICD-10-CM | POA: Insufficient documentation

## 2012-02-10 MED ORDER — CIPROFLOXACIN-DEXAMETHASONE 0.3-0.1 % OT SUSP: 4.0000 [drp] | Freq: Two times a day (BID) | OTIC | Status: DC

## 2012-02-10 MED ORDER — NAPROXEN 375 MG PO TABS: 375.0000 mg | ORAL_TABLET | Freq: Two times a day (BID) | ORAL | Status: DC

## 2012-02-10 MED ORDER — DESLORATADINE 5 MG PO TABS: 5.0000 mg | ORAL_TABLET | Freq: Every day | ORAL | Status: DC

## 2012-02-10 NOTE — ED Notes (Signed)
Pt states seen and treated for a ear infection recently pt took all of medications and then started havign similar syptoms a week ago sat. Pt has ear ache in both ears.

## 2012-02-10 NOTE — ED Provider Notes (Signed)
History     CSN: [id number]  Arrival date & time 02/10/12  0546   First MD Initiated Contact with Patient 02/10/12 [phone]      Chief Complaint  Patient presents with  . Recurrent Otitis    (Consider location/radiation/quality/duration/timing/severity/associated sxs/prior treatment) Patient is a 21 y.o. female presenting with ear pain. The history is provided by the patient.  Otalgia This is a recurrent problem. The current episode started more than 2 days ago. There is pain in both ears. The problem occurs constantly. There has been no fever. The pain is at a severity of 10/10. The pain is severe. Associated symptoms include neck pain. Pertinent negatives include no ear discharge, no hearing loss, no rhinorrhea, no sore throat and no rash. Her past medical history does not include hearing loss or tympanostomy tube. Past medical history comments: previous otitis externa.    Past Medical History  Diagnosis Date  . Herpes, genital   . Hypertension   . Pregnancy induced hypertension   . Obesity   . Preeclampsia, severe 2012    lead to C section.  . Pancreatitis   . H/O varicella   . Yeast infection   . Trichomonas     Past Surgical History  Procedure Date  . Cholecystectomy 07/05/2011    Procedure: LAPAROSCOPIC CHOLECYSTECTOMY WITH INTRAOPERATIVE CHOLANGIOGRAM;  Surgeon: Ernestene Mention, MD;  Location: [facility] OR;  Service: General;  Laterality: N/A;  . Ercp 07/09/2011    Procedure: ENDOSCOPIC RETROGRADE CHOLANGIOPANCREATOGRAPHY (ERCP);  Surgeon: Louis Meckel, MD;  Location: [facility] OR;  Service: Endoscopy;  Laterality: N/A;  . Cesarean section 05/03/2011    Procedure: CESAREAN SECTION;  Surgeon: Janine Limbo, MD;  Location: [facility] ORS;  Service: Gynecology;  Laterality: N/A;    Family History  Problem Relation Age of Onset  . Diabetes Mother   . Obesity Mother   . Asthma Sister   . Obesity Sister   . Diabetes Maternal Aunt   . Obesity Maternal Aunt   . Diabetes Maternal  Grandmother   . Anesthesia problems Neg Hx     History  Substance Use Topics  . Smoking status: Former Smoker -- 2 years    Types: Cigarettes    Quit date: 10/13/2010  . Smokeless tobacco: Never Used  . Alcohol Use: No    OB History    Grav Para Term Preterm Abortions TAB SAB Ect Mult Living   1 1 0 1 0 0 0 0 0 1       Review of Systems  Constitutional: Negative for fever.  HENT: Positive for ear pain and neck pain. Negative for hearing loss, sore throat, rhinorrhea and ear discharge.   Skin: Negative for rash.  All other systems reviewed and are negative.    Allergies  Review of patient's allergies indicates no known allergies.  Home Medications   Current Outpatient Rx  Name Route Sig Dispense Refill  . CIPROFLOXACIN-DEXAMETHASONE 0.3-0.1 % OT SUSP Left Ear Place 4 drops into the left ear 2 (two) times daily. 7.5 mL 0  . DESLORATADINE 5 MG PO TABS Oral Take 1 tablet (5 mg total) by mouth daily. 7 tablet 0  . KETOCONAZOLE 2 % EX CREA Topical Apply topically daily. Daughters medication    . KETOCONAZOLE 2 % EX CREA Topical Apply topically 2 (two) times daily. 15 g 0  . MEDROXYPROGESTERONE ACETATE 150 MG/ML IM SUSP Intramuscular Inject 1 mL (150 mg total) into the muscle every 3 (three) months. 1 mL 3  Give first dose prior to discharge home.  Marland Kitchen NAPROXEN 375 MG PO TABS Oral Take 1 tablet (375 mg total) by mouth 2 (two) times daily. 20 tablet 0  . NIFEDIPINE ER OSMOTIC 90 MG PO TB24 Oral Take 90 mg by mouth at bedtime.    Marland Kitchen RANITIDINE HCL 150 MG PO TABS Oral Take 1 tablet (150 mg total) by mouth 2 (two) times daily. 60 tablet 1  . VALACYCLOVIR HCL 500 MG PO TABS Oral Take 500 mg by mouth 2 (two) times daily as needed. for outbreaks      BP 150/105  Pulse 82  Temp 97.4 F (36.3 C) (Oral)  Resp 16  SpO2 99%  Breastfeeding? Unknown  Physical Exam  Constitutional: She is oriented to person, place, and time. She appears well-developed and well-nourished.  HENT:    Head: Normocephalic and atraumatic.  Right Ear: No lacerations. Tympanic membrane is scarred and retracted. Tympanic membrane is not erythematous. No hemotympanum.  Left Ear: No mastoid tenderness. Tympanic membrane is scarred. Tympanic membrane is not injected, not perforated and not erythematous. No hemotympanum.  Mouth/Throat: Oropharynx is clear and moist. No oropharyngeal exudate.       Crusting and red left canal   Eyes: EOM are normal.  Neck: Normal range of motion. Neck supple.  Cardiovascular: Normal rate and regular rhythm.   Pulmonary/Chest: Effort normal and breath sounds normal. She has no wheezes. She has no rales.  Abdominal: Soft. Bowel sounds are normal. There is no tenderness. There is no rebound and no guarding.  Musculoskeletal: Normal range of motion.  Neurological: She is alert and oriented to person, place, and time.  Skin: Skin is warm and dry.  Psychiatric: She has a normal mood and affect.    ED Course  Procedures (including critical care time)  Labs Reviewed - No data to display No results found.   1. Otitis externa       MDM  Use all drops, follow up with ENT for ongoing care.  No Qtips.  No swimming.  Patient verbalizes understanding and agrees to follow up        Toby Breithaupt Smitty Cords, MD 02/10/12 [phone]

## 2012-02-10 NOTE — ED Notes (Signed)
Pt for discharge.GCS 15 

## 2012-02-12 ENCOUNTER — Emergency Department (HOSPITAL_COMMUNITY)
Admission: EM | Admit: 2012-02-12 | Discharge: 2012-02-12 | Disposition: A | Discharge status: Home / Self Care | Payer: Medicaid Other | Attending: Emergency Medicine | Admitting: Emergency Medicine

## 2012-02-12 ENCOUNTER — Encounter (HOSPITAL_COMMUNITY): Payer: Self-pay | Admitting: *Deleted

## 2012-02-12 DIAGNOSIS — Z8489 Family history of other specified conditions: Secondary | ICD-10-CM | POA: Insufficient documentation

## 2012-02-12 DIAGNOSIS — Z825 Family history of asthma and other chronic lower respiratory diseases: Secondary | ICD-10-CM | POA: Insufficient documentation

## 2012-02-12 DIAGNOSIS — Z833 Family history of diabetes mellitus: Secondary | ICD-10-CM | POA: Insufficient documentation

## 2012-02-12 DIAGNOSIS — H60399 Other infective otitis externa, unspecified ear: Secondary | ICD-10-CM | POA: Insufficient documentation

## 2012-02-12 DIAGNOSIS — I10 Essential (primary) hypertension: Secondary | ICD-10-CM | POA: Insufficient documentation

## 2012-02-12 DIAGNOSIS — Z87891 Personal history of nicotine dependence: Secondary | ICD-10-CM | POA: Insufficient documentation

## 2012-02-12 DIAGNOSIS — E669 Obesity, unspecified: Secondary | ICD-10-CM | POA: Insufficient documentation

## 2012-02-12 DIAGNOSIS — H6090 Unspecified otitis externa, unspecified ear: Secondary | ICD-10-CM

## 2012-02-12 MED ORDER — IBUPROFEN 800 MG PO TABS
800.0000 mg | ORAL_TABLET | Freq: Once | ORAL | Status: AC
  Administered 2012-02-12: 800 mg via ORAL
  Filled 2012-02-12: qty 1

## 2012-02-12 MED ORDER — AMOXICILLIN 500 MG PO CAPS: 1000.0000 mg | ORAL_CAPSULE | Freq: Two times a day (BID) | ORAL | Status: AC

## 2012-02-12 MED ORDER — NEOMYCIN-POLYMYXIN-HC 3.5-10000-1 OT SOLN
3.0000 [drp] | Freq: Four times a day (QID) | OTIC | Status: DC
  Administered 2012-02-12: 3 [drp] via OTIC
  Filled 2012-02-12: qty 10

## 2012-02-12 MED ORDER — AMOXICILLIN 500 MG PO CAPS
1000.0000 mg | ORAL_CAPSULE | Freq: Once | ORAL | Status: AC
  Administered 2012-02-12: 1000 mg via ORAL
  Filled 2012-02-12: qty 1

## 2012-02-12 MED ORDER — IBUPROFEN 800 MG PO TABS: 800.0000 mg | ORAL_TABLET | Freq: Three times a day (TID) | ORAL | Status: AC | PRN

## 2012-02-12 NOTE — ED Notes (Addendum)
C/o bilateral ear ache. L>R, L has been draining. Onset 1-1.5 weeks ago. (Denies: fever,nv, dizziness), "sometimes hearing goes in and out". Seen here 2d ago. "Plan & meds not working".

## 2012-02-12 NOTE — ED Provider Notes (Signed)
History     CSN: [id number]  Arrival date & time 02/12/12  0410   First MD Initiated Contact with Patient 02/12/12 0532      Chief Complaint  Patient presents with  . Otalgia    (Consider location/radiation/quality/duration/timing/severity/associated sxs/prior treatment) HPI Comments: Chronic recurrent ear pain draining recently started on Cipro drops naprosyn PO without resolution. States Ibuprofen, Polymyxin otic and Amoxicillin po is what works best for her   She also made an appointment with ENT for FU but first available appointment was not until October 18   Patient is a 21 y.o. female presenting with ear pain. The history is provided by the patient.  Otalgia This is a recurrent problem. The current episode started more than 1 week ago. There is pain in both ears. The problem occurs constantly. The problem has not changed since onset.There has been no fever. Associated symptoms include ear discharge. Pertinent negatives include no headaches and no rhinorrhea.    Past Medical History  Diagnosis Date  . Herpes, genital   . Hypertension   . Pregnancy induced hypertension   . Obesity   . Preeclampsia, severe 2012    lead to C section.  . Pancreatitis   . H/O varicella   . Yeast infection   . Trichomonas     Past Surgical History  Procedure Date  . Cholecystectomy 07/05/2011    Procedure: LAPAROSCOPIC CHOLECYSTECTOMY WITH INTRAOPERATIVE CHOLANGIOGRAM;  Surgeon: Ernestene Mention, MD;  Location: [facility] OR;  Service: General;  Laterality: N/A;  . Ercp 07/09/2011    Procedure: ENDOSCOPIC RETROGRADE CHOLANGIOPANCREATOGRAPHY (ERCP);  Surgeon: Louis Meckel, MD;  Location: [facility] OR;  Service: Endoscopy;  Laterality: N/A;  . Cesarean section 05/03/2011    Procedure: CESAREAN SECTION;  Surgeon: Janine Limbo, MD;  Location: [facility] ORS;  Service: Gynecology;  Laterality: N/A;    Family History  Problem Relation Age of Onset  . Diabetes Mother   . Obesity Mother   . Asthma Sister   .  Obesity Sister   . Diabetes Maternal Aunt   . Obesity Maternal Aunt   . Diabetes Maternal Grandmother   . Anesthesia problems Neg Hx     History  Substance Use Topics  . Smoking status: Former Smoker -- 2 years    Types: Cigarettes    Quit date: 10/13/2010  . Smokeless tobacco: Never Used  . Alcohol Use: No    OB History    Grav Para Term Preterm Abortions TAB SAB Ect Mult Living   1 1 0 1 0 0 0 0 0 1       Review of Systems  Constitutional: Negative for fever and chills.  HENT: Positive for ear pain and ear discharge. Negative for congestion and rhinorrhea.   Gastrointestinal: Negative for nausea.  Musculoskeletal: Negative for myalgias.  Neurological: Negative for dizziness, weakness and headaches.    Allergies  Review of patient's allergies indicates no known allergies.  Home Medications   Current Outpatient Rx  Name Route Sig Dispense Refill  . IBUPROFEN 200 MG PO TABS Oral Take 800 mg by mouth every 6 (six) hours as needed. For pain    . MEDROXYPROGESTERONE ACETATE 150 MG/ML IM SUSP Intramuscular Inject 1 mL (150 mg total) into the muscle every 3 (three) months. 1 mL 3    Give first dose prior to discharge home.  Marland Kitchen AMOXICILLIN 500 MG PO CAPS Oral Take 2 capsules (1,000 mg total) by mouth 2 (two) times daily. 40 capsule 0  . IBUPROFEN  800 MG PO TABS Oral Take 1 tablet (800 mg total) by mouth every 8 (eight) hours as needed for pain. 30 tablet 0    BP 151/104  Pulse 91  Temp 97 F (36.1 C) (Oral)  Resp 16  SpO2 99%  Physical Exam  Constitutional: She is oriented to person, place, and time. She appears well-developed and well-nourished.  HENT:  Head: Normocephalic.  Right Ear: There is drainage and tenderness. No mastoid tenderness. Tympanic membrane is scarred.  Left Ear: There is drainage and tenderness. No mastoid tenderness. Tympanic membrane is scarred and retracted.  Cardiovascular: Normal rate.   Pulmonary/Chest: Effort normal.  Musculoskeletal:  Normal range of motion.  Neurological: She is alert and oriented to person, place, and time.  Skin: Skin is warm. No erythema.    ED Course  Procedures (including critical care time)  Labs Reviewed - No data to display No results found.   1. Otitis externa       MDM   Will switch to PO antibiotic, Ibuprofen and Polymixin otic         Arman Filter, NP 02/12/12 0549  Arman Filter, NP 02/12/12 [phone]

## 2012-02-12 NOTE — ED Notes (Signed)
Patient presents with c/o bilateral ear pain for the last 1-1.5 weeks.  Seen here 2 days ago for the same but nothing seems to be helping

## 2012-02-13 NOTE — ED Provider Notes (Signed)
Medical screening examination/treatment/procedure(s) were performed by non-physician practitioner and as supervising physician I was immediately available for consultation/collaboration.    Vida Roller, MD 02/13/12 [phone]

## 2012-02-14 ENCOUNTER — Encounter: Payer: Self-pay | Admitting: Family Medicine

## 2012-02-14 ENCOUNTER — Ambulatory Visit (INDEPENDENT_AMBULATORY_CARE_PROVIDER_SITE_OTHER): Payer: Medicaid Other | Admitting: Family Medicine

## 2012-02-14 VITALS — BP 156/89 | HR 87 | Temp 98.8°F | Ht 71.0 in | Wt 354.9 lb

## 2012-02-14 DIAGNOSIS — Z309 Encounter for contraceptive management, unspecified: Secondary | ICD-10-CM

## 2012-02-14 DIAGNOSIS — H6090 Unspecified otitis externa, unspecified ear: Secondary | ICD-10-CM

## 2012-02-14 DIAGNOSIS — R030 Elevated blood-pressure reading, without diagnosis of hypertension: Secondary | ICD-10-CM

## 2012-02-14 DIAGNOSIS — H60399 Other infective otitis externa, unspecified ear: Secondary | ICD-10-CM

## 2012-02-14 MED ORDER — MEDROXYPROGESTERONE ACETATE 150 MG/ML IM SUSP
150.0000 mg | Freq: Once | INTRAMUSCULAR | Status: AC
  Administered 2012-02-14: 150 mg via INTRAMUSCULAR

## 2012-02-14 MED ORDER — HYDROCORTISONE-ACETIC ACID 1-2 % OT SOLN: 4.0000 [drp] | Freq: Two times a day (BID) | OTIC | Status: AC

## 2012-02-14 NOTE — Addendum Note (Signed)
Addended by: Farrell Ours on: 02/14/2012 02:48 PM   Modules accepted: Orders

## 2012-02-14 NOTE — Progress Notes (Signed)
Patient ID: Andrea Morton, female   DOB: Oct 31, 1990, 21 y.o.   MRN: [id number] Subjective: The patient is a 21 y.o. year old female who presents today for ear pain.  1. Ear pain: Began 1 month ago.  Has been back and forth to ED and urgent care 3 times.  Has been treated with amoxicillin two times and ear drops 3 times.  Has always come back.  Pain is located in ear canal.  Hearing may be affected a little, but not bad.  Has had some drainage, clear, from left ear.  Both ears are bothering her but left is worse than right.  No fevers/chills, no nausea/vomiting.  2. HTN: Patient had pre-e with most recent pregnancy.  Otherwise is asymptomatic.  Reports bp was normal prior to current ear infections.  No cp/sob/doe.  Patient's past medical, social, and family history were reviewed and updated as appropriate. History  Substance Use Topics  . Smoking status: Former Smoker -- 2 years    Types: Cigarettes    Quit date: 10/13/2010  . Smokeless tobacco: Never Used  . Alcohol Use: No   Objective:  Filed Vitals:   02/14/12 0846  BP: 156/89  Pulse: 87  Temp: 98.8 F (37.1 C)   Gen: NAD, obese HEENT: No adenopathy, neck slightly tender on left, white exudate with obvious inflammation in bilateral ear canals, left worse than right, TM intact bilaterally, no dental lesions CV: RRR, no murmurs Resp: CTABL  Assessment/Plan:  Please also see individual problems in problem list for problem-specific plans.

## 2012-02-14 NOTE — Patient Instructions (Addendum)
We will try a different ear drop and will send you to ENT. Please try taking 400mg  of ibuprofen every 4 hours. Come back in about 1 month for [place] to recheck your blood pressure.

## 2012-02-14 NOTE — Assessment & Plan Note (Signed)
No obvious fungal element, TM intact bilaterally.  Recurrent x1 month despite appropriate treatment.  Will treat with acetic acid plus steroid and arrange for ENT f/u next week.

## 2012-02-14 NOTE — Assessment & Plan Note (Signed)
RTC 1 month for bp recheck once ear infection under control.  Likely has HTN.  No current concerning findings on exam or history.

## 2012-04-01 ENCOUNTER — Ambulatory Visit: Payer: Medicaid Other

## 2012-04-07 ENCOUNTER — Encounter (HOSPITAL_COMMUNITY): Payer: Self-pay | Admitting: Emergency Medicine

## 2012-04-07 ENCOUNTER — Emergency Department (HOSPITAL_COMMUNITY)
Admission: EM | Admit: 2012-04-07 | Discharge: 2012-04-07 | Disposition: A | Discharge status: Home / Self Care | Payer: Medicaid Other | Attending: Emergency Medicine | Admitting: Emergency Medicine

## 2012-04-07 DIAGNOSIS — H6000 Abscess of external ear, unspecified ear: Secondary | ICD-10-CM

## 2012-04-07 DIAGNOSIS — H60399 Other infective otitis externa, unspecified ear: Secondary | ICD-10-CM | POA: Insufficient documentation

## 2012-04-07 DIAGNOSIS — Z87891 Personal history of nicotine dependence: Secondary | ICD-10-CM | POA: Insufficient documentation

## 2012-04-07 DIAGNOSIS — E669 Obesity, unspecified: Secondary | ICD-10-CM | POA: Insufficient documentation

## 2012-04-07 DIAGNOSIS — Z8719 Personal history of other diseases of the digestive system: Secondary | ICD-10-CM | POA: Insufficient documentation

## 2012-04-07 DIAGNOSIS — Z8619 Personal history of other infectious and parasitic diseases: Secondary | ICD-10-CM | POA: Insufficient documentation

## 2012-04-07 DIAGNOSIS — I10 Essential (primary) hypertension: Secondary | ICD-10-CM | POA: Insufficient documentation

## 2012-04-07 DIAGNOSIS — Z8742 Personal history of other diseases of the female genital tract: Secondary | ICD-10-CM | POA: Insufficient documentation

## 2012-04-07 DIAGNOSIS — A6000 Herpesviral infection of urogenital system, unspecified: Secondary | ICD-10-CM | POA: Insufficient documentation

## 2012-04-07 MED ORDER — CEPHALEXIN 500 MG PO CAPS: 500.0000 mg | ORAL_CAPSULE | Freq: Three times a day (TID) | ORAL | Status: DC

## 2012-04-07 MED ORDER — CEPHALEXIN 250 MG PO CAPS
500.0000 mg | ORAL_CAPSULE | Freq: Once | ORAL | Status: AC
  Administered 2012-04-07: 500 mg via ORAL
  Filled 2012-04-07: qty 2

## 2012-04-07 MED ORDER — OXYCODONE-ACETAMINOPHEN 5-325 MG PO TABS: 1.0000 | ORAL_TABLET | ORAL | Status: DC | PRN

## 2012-04-07 NOTE — ED Provider Notes (Signed)
History     CSN: [id number]  Arrival date & time 04/07/12  4098   First MD Initiated Contact with Patient 04/07/12 0405      Chief Complaint  Patient presents with  . Otalgia    (Consider location/radiation/quality/duration/timing/severity/associated sxs/prior treatment) Patient is a 21 y.o. female presenting with ear pain. The history is provided by the patient.  Otalgia  She has had pain and swelling in her right ear for the last week. This is getting significantly worse. She rates pain at 6/10. She had been treated for recurrent ear infections and had been taking amoxicillin and ibuprofen whenever they flared up, but she has run out of both of those medications. Nares are difficult he hearing and she denies fever chills or sweats. She has noted some slight scabbing to her right ear with some slight drainage.  Past Medical History  Diagnosis Date  . Herpes, genital   . Hypertension   . Pregnancy induced hypertension   . Obesity   . Preeclampsia, severe 2012    lead to C section.  . Pancreatitis   . H/O varicella   . Yeast infection   . Trichomonas     Past Surgical History  Procedure Date  . Cholecystectomy 07/05/2011    Procedure: LAPAROSCOPIC CHOLECYSTECTOMY WITH INTRAOPERATIVE CHOLANGIOGRAM;  Surgeon: Ernestene Mention, MD;  Location: [facility] OR;  Service: General;  Laterality: N/A;  . Ercp 07/09/2011    Procedure: ENDOSCOPIC RETROGRADE CHOLANGIOPANCREATOGRAPHY (ERCP);  Surgeon: Louis Meckel, MD;  Location: [facility] OR;  Service: Endoscopy;  Laterality: N/A;  . Cesarean section 05/03/2011    Procedure: CESAREAN SECTION;  Surgeon: Janine Limbo, MD;  Location: [facility] ORS;  Service: Gynecology;  Laterality: N/A;    Family History  Problem Relation Age of Onset  . Diabetes Mother   . Obesity Mother   . Asthma Sister   . Obesity Sister   . Diabetes Maternal Aunt   . Obesity Maternal Aunt   . Diabetes Maternal Grandmother   . Anesthesia problems Neg Hx     History    Substance Use Topics  . Smoking status: Former Smoker -- 2 years    Types: Cigarettes    Quit date: 10/13/2010  . Smokeless tobacco: Never Used  . Alcohol Use: No    OB History    Grav Para Term Preterm Abortions TAB SAB Ect Mult Living   1 1 0 1 0 0 0 0 0 1       Review of Systems  HENT: Positive for ear pain.   All other systems reviewed and are negative.    Allergies  Review of patient's allergies indicates no known allergies.  Home Medications   Current Outpatient Rx  Name  Route  Sig  Dispense  Refill  . IBUPROFEN 200 MG PO TABS   Oral   Take 800 mg by mouth every 6 (six) hours as needed. For pain         . MEDROXYPROGESTERONE ACETATE 150 MG/ML IM SUSP   Intramuscular   Inject 1 mL (150 mg total) into the muscle every 3 (three) months.   1 mL   3     Give first dose prior to discharge home.     BP 140/82  Pulse 76  Temp 98.2 F (36.8 C) (Oral)  Resp 20  SpO2 100%  Physical Exam  Nursing note and vitals reviewed. 21 year old female, resting comfortably and in no acute distress. Vital signs are normal. Oxygen saturation is  100%, which is normal. Head is normocephalic and atraumatic. PERRLA, EOMI. Oropharynx is clear. Tympanic membranes are clear. There is an area approximately 1 cm in diameter on the tragus of the right ear which is erythematous and swollen with slight fluctuance. This is consistent with an abscess. The swollen and fluctuant area does extend into the preauricular area. Neck is nontender and supple without adenopathy or JVD. Back is nontender and there is no CVA tenderness. Lungs are clear without rales, wheezes, or rhonchi. Chest is nontender. Heart has regular rate and rhythm without murmur. Abdomen is soft, flat, nontender without masses or hepatosplenomegaly and peristalsis is normoactive. Extremities have no cyanosis or edema, full range of motion is present. Skin is warm and dry without rash. Neurologic: Mental status is normal,  cranial nerves are intact, there are no motor or sensory deficits.   ED Course  Procedures (including critical care time)  INCISION AND DRAINAGE Performed by: KGMWN,UUVOZ Consent: Verbal consent obtained. Risks and benefits: risks, benefits and alternatives were discussed Type: abscess  Body area: right ear  Anesthesia: local infiltration  Local anesthetic: lidocaine 2% without epinephrine  Anesthetic total: 1 ml  Because of location, it was elected to attempt only needle aspiration rather than actual incision on. Aspiration with 18-gauge needle yielded only a very small amount of thick, purulent material.  Complexity: simple  Drainage: purulent, thick  Drainage amount: scant  Patient tolerance: Patient tolerated the procedure well with no immediate complications.     1. Abscess of external ear       MDM  Abscess of the right tragus and preauricular area. Aspiration yielded a small amount of very thick material. I feel that incision and drainage to be done by ENT, so she is referred to ENT for followup. She is sent home with prescriptions for Percocet and cephalexin. Old records were reviewed, and she had a series of ED and office visits for what had been diagnosed as right outer ear infection is.        Dione Booze, MD 04/07/12 [phone]

## 2012-04-07 NOTE — ED Notes (Signed)
right ear pain for about a week now, denies any dizziness. Took OTC 800 mg motrin without relief.

## 2012-04-23 ENCOUNTER — Ambulatory Visit: Payer: Medicaid Other | Admitting: Family Medicine

## 2012-05-05 ENCOUNTER — Other Ambulatory Visit (HOSPITAL_COMMUNITY)
Admission: RE | Admit: 2012-05-05 | Discharge: 2012-05-05 | Disposition: A | Discharge status: Home / Self Care | Payer: Medicaid Other | Source: Ambulatory Visit | Attending: Family Medicine | Admitting: Family Medicine

## 2012-05-05 ENCOUNTER — Encounter: Payer: Self-pay | Admitting: Family Medicine

## 2012-05-05 ENCOUNTER — Ambulatory Visit (INDEPENDENT_AMBULATORY_CARE_PROVIDER_SITE_OTHER): Payer: Medicaid Other | Admitting: Family Medicine

## 2012-05-05 VITALS — BP 140/84 | HR 116 | Temp 98.9°F | Ht 71.0 in | Wt 344.0 lb

## 2012-05-05 DIAGNOSIS — Z309 Encounter for contraceptive management, unspecified: Secondary | ICD-10-CM

## 2012-05-05 DIAGNOSIS — H6090 Unspecified otitis externa, unspecified ear: Secondary | ICD-10-CM

## 2012-05-05 DIAGNOSIS — Z01419 Encounter for gynecological examination (general) (routine) without abnormal findings: Secondary | ICD-10-CM | POA: Insufficient documentation

## 2012-05-05 DIAGNOSIS — Z113 Encounter for screening for infections with a predominantly sexual mode of transmission: Secondary | ICD-10-CM | POA: Insufficient documentation

## 2012-05-05 DIAGNOSIS — H60399 Other infective otitis externa, unspecified ear: Secondary | ICD-10-CM

## 2012-05-05 DIAGNOSIS — I10 Essential (primary) hypertension: Secondary | ICD-10-CM

## 2012-05-05 DIAGNOSIS — N898 Other specified noninflammatory disorders of vagina: Secondary | ICD-10-CM

## 2012-05-05 LAB — POCT WET PREP (WET MOUNT): Clue Cells Wet Prep Whiff POC: NEGATIVE

## 2012-05-05 MED ORDER — MEDROXYPROGESTERONE ACETATE 150 MG/ML IM SUSP
150.0000 mg | Freq: Once | INTRAMUSCULAR | Status: AC
  Administered 2012-05-05: 150 mg via INTRAMUSCULAR

## 2012-05-05 MED ORDER — HYDROCHLOROTHIAZIDE 25 MG PO TABS: 25.0000 mg | ORAL_TABLET | Freq: Every day | ORAL | Status: DC

## 2012-05-05 NOTE — Patient Instructions (Signed)
It was good to see you.  Please start taking HCTZ, one pill daily for your blood pressure. Please come back and see me in about 3 months so I can see how your blood pressure is doing.   I will call you with the results of your labs.

## 2012-05-05 NOTE — Assessment & Plan Note (Signed)
Will start HCTZ for HTN, f/u in 3 months for BP check.

## 2012-05-05 NOTE — Progress Notes (Signed)
  Subjective:    Patient ID: Andrea Morton, female    DOB: February 21, 1991, 21 y.o.   MRN: [id number]  HPI  Andrea Morton comes in for a well woman exam.  She recently had otitis externa and developed an ear abscess.  She has not yet seen ENT in their office because they have not been able to get her a morning appointment.  She says her ears are overall doing better, but still having some discomfort.   She complains of some vaginal discharge and itching.  She says she is sexually active, with one partner, they do not use condoms.  She is using Depo for contraception, which she is due for today.   Blood Pressure- had elevated blood pressure during pregnancy.  She was supposed to take medications, but says she did not take it regularly (infant was born 10 weeks premature due to pre-eclampsia).  Denies chest pain, dyspnea, palpitations, LE edema.   Past Medical History  Diagnosis Date  . Herpes, genital   . Hypertension   . Pregnancy induced hypertension   . Obesity   . Preeclampsia, severe 2012    lead to C section.  . Pancreatitis   . H/O varicella   . Yeast infection   . Trichomonas    Family History  Problem Relation Age of Onset  . Diabetes Mother   . Obesity Mother   . Asthma Sister   . Obesity Sister   . Diabetes Maternal Aunt   . Obesity Maternal Aunt   . Diabetes Maternal Grandmother   . Anesthesia problems Neg Hx    History  Substance Use Topics  . Smoking status: Former Smoker -- 2 years    Types: Cigarettes    Quit date: 10/13/2010  . Smokeless tobacco: Never Used  . Alcohol Use: No   Review of Systems Pertinent items in HPI    Objective:   Physical Exam BP 140/84  Pulse 116  Temp 98.9 F (37.2 C) (Oral)  Ht 5\' 11"  (1.803 m)  Wt 344 lb (156.037 kg)  BMI 47.98 kg/m2 General appearance: alert, cooperative and no distress, morbidly obese Lungs: clear to auscultation bilaterally Heart: regular rate and rhythm, S1, S2 normal, no murmur, click, rub or  gallop Pelvic: cervix normal in appearance, external genitalia normal, no adnexal masses or tenderness, no cervical motion tenderness, rectovaginal septum normal, uterus normal size, shape, and consistency and vagina with thick white discharge Pulses: 2+ and symmetric       Assessment & Plan:

## 2012-05-05 NOTE — Assessment & Plan Note (Signed)
Wet prep and GC/Chlamydia done today for screening, does not need pap until 2015.  Patient declines flu shot.  Discussed weight management with dietary changes and exercise.

## 2012-05-05 NOTE — Assessment & Plan Note (Signed)
Referral has been made to ENT, encouraged patient to call to re-schedule appointment for a time that works for her.

## 2012-05-07 ENCOUNTER — Telehealth: Payer: Self-pay | Admitting: Family Medicine

## 2012-05-07 MED ORDER — FLUCONAZOLE 150 MG PO TABS: 150.0000 mg | ORAL_TABLET | Freq: Once | ORAL | Status: DC

## 2012-05-07 NOTE — Telephone Encounter (Signed)
Called patient, let her know Wet prep showed yeast infection, but all other tests negative.  Diflucan sent to pharmacy.

## 2012-05-21 ENCOUNTER — Emergency Department (HOSPITAL_COMMUNITY)
Admission: EM | Admit: 2012-05-21 | Discharge: 2012-05-22 | Disposition: A | Discharge status: Home / Self Care | Payer: Medicaid Other | Attending: Emergency Medicine | Admitting: Emergency Medicine

## 2012-05-21 ENCOUNTER — Encounter (HOSPITAL_COMMUNITY): Payer: Self-pay

## 2012-05-21 DIAGNOSIS — I10 Essential (primary) hypertension: Secondary | ICD-10-CM | POA: Insufficient documentation

## 2012-05-21 DIAGNOSIS — Z9089 Acquired absence of other organs: Secondary | ICD-10-CM | POA: Insufficient documentation

## 2012-05-21 DIAGNOSIS — Z3202 Encounter for pregnancy test, result negative: Secondary | ICD-10-CM | POA: Insufficient documentation

## 2012-05-21 DIAGNOSIS — R1110 Vomiting, unspecified: Secondary | ICD-10-CM | POA: Insufficient documentation

## 2012-05-21 DIAGNOSIS — Z8719 Personal history of other diseases of the digestive system: Secondary | ICD-10-CM | POA: Insufficient documentation

## 2012-05-21 DIAGNOSIS — R1013 Epigastric pain: Secondary | ICD-10-CM | POA: Insufficient documentation

## 2012-05-21 DIAGNOSIS — Z8619 Personal history of other infectious and parasitic diseases: Secondary | ICD-10-CM | POA: Insufficient documentation

## 2012-05-21 DIAGNOSIS — H6690 Otitis media, unspecified, unspecified ear: Secondary | ICD-10-CM

## 2012-05-21 DIAGNOSIS — H61899 Other specified disorders of external ear, unspecified ear: Secondary | ICD-10-CM | POA: Insufficient documentation

## 2012-05-21 DIAGNOSIS — Z87891 Personal history of nicotine dependence: Secondary | ICD-10-CM | POA: Insufficient documentation

## 2012-05-21 DIAGNOSIS — E669 Obesity, unspecified: Secondary | ICD-10-CM | POA: Insufficient documentation

## 2012-05-21 LAB — URINALYSIS, MICROSCOPIC ONLY
Glucose, UA: NEGATIVE mg/dL
Hgb urine dipstick: NEGATIVE
Ketones, ur: NEGATIVE mg/dL
Protein, ur: NEGATIVE mg/dL

## 2012-05-21 LAB — CBC WITH DIFFERENTIAL/PLATELET
Basophils Absolute: 0 10*3/uL (ref 0.0–0.1)
HCT: 39.3 % (ref 36.0–46.0)
Hemoglobin: 13.1 g/dL (ref 12.0–15.0)
Lymphocytes Relative: 23 % (ref 12–46)
Lymphs Abs: 2.4 10*3/uL (ref 0.7–4.0)
MCV: 90.8 fL (ref 78.0–100.0)
Monocytes Absolute: 0.4 10*3/uL (ref 0.1–1.0)
Monocytes Relative: 4 % (ref 3–12)
Neutro Abs: 7.3 10*3/uL (ref 1.7–7.7)
RBC: 4.33 MIL/uL (ref 3.87–5.11)
RDW: 14.6 % (ref 11.5–15.5)
WBC: 10.6 10*3/uL — ABNORMAL HIGH (ref 4.0–10.5)

## 2012-05-21 NOTE — ED Notes (Addendum)
Pt reports sharp upper abd pain and vomiting x3 starting today at 1400, pt also reports an earache "for some time," pt reports she is unable to remember when her ear started hurting. Pt reports a hx of pancreatitis and sts the pain feels the same

## 2012-05-22 LAB — COMPREHENSIVE METABOLIC PANEL
ALT: 12 U/L (ref 0–35)
AST: 11 U/L (ref 0–37)
CO2: 23 mEq/L (ref 19–32)
Chloride: 105 mEq/L (ref 96–112)
Creatinine, Ser: 0.83 mg/dL (ref 0.50–1.10)
GFR calc Af Amer: >90 mL/min (ref >90)
GFR calc non Af Amer: >90 mL/min (ref >90)
Glucose, Bld: 100 mg/dL — ABNORMAL HIGH (ref 70–99)
Total Bilirubin: 0.3 mg/dL (ref 0.3–1.2)

## 2012-05-22 MED ORDER — HYDROCODONE-ACETAMINOPHEN 5-325 MG PO TABS: 1.0000 | ORAL_TABLET | Freq: Four times a day (QID) | ORAL | Status: DC | PRN

## 2012-05-22 MED ORDER — GI COCKTAIL ~~LOC~~
30.0000 mL | Freq: Once | ORAL | Status: AC
  Administered 2012-05-22: 30 mL via ORAL
  Filled 2012-05-22: qty 30

## 2012-05-22 MED ORDER — PROMETHAZINE HCL 25 MG PO TABS: 25.0000 mg | ORAL_TABLET | Freq: Four times a day (QID) | ORAL | Status: DC | PRN

## 2012-05-22 MED ORDER — MORPHINE SULFATE 4 MG/ML IJ SOLN
4.0000 mg | Freq: Once | INTRAMUSCULAR | Status: AC
  Administered 2012-05-22: 4 mg via INTRAVENOUS
  Filled 2012-05-22: qty 1

## 2012-05-22 MED ORDER — SODIUM CHLORIDE 0.9 % IV BOLUS (SEPSIS)
1000.0000 mL | Freq: Once | INTRAVENOUS | Status: AC
  Administered 2012-05-22: 1000 mL via INTRAVENOUS

## 2012-05-22 MED ORDER — ONDANSETRON HCL 4 MG/2ML IJ SOLN
4.0000 mg | Freq: Once | INTRAMUSCULAR | Status: AC
  Administered 2012-05-22: 4 mg via INTRAVENOUS
  Filled 2012-05-22: qty 2

## 2012-05-22 MED ORDER — DOXYCYCLINE HYCLATE 100 MG PO CAPS: 100.0000 mg | ORAL_CAPSULE | Freq: Two times a day (BID) | ORAL | Status: DC

## 2012-05-22 NOTE — ED Provider Notes (Signed)
Medical screening examination/treatment/procedure(s) were performed by non-physician practitioner and as supervising physician I was immediately available for consultation/collaboration.    Donaldo Teegarden B. Bernette Mayers, MD 05/22/12 1339

## 2012-05-22 NOTE — ED Provider Notes (Signed)
History     CSN: [id number]  Arrival date & time 05/21/12  2229   First MD Initiated Contact with Patient 05/22/12 0005      Chief Complaint  Patient presents with  . Abdominal Pain  . Otalgia    (Consider location/radiation/quality/duration/timing/severity/associated sxs/prior treatment) HPI The patient presents with epigastric pain and 3 episodes of vomiting today.  She denies nausea, fever, chills, chest pain, shortness of breath, syncope, back pain, dizziness diarrhea, or constipation.  She also reports of R ear pain with discharge.  She reports similar pain in the past.  Patient, states she did not take all the antibiotics appropriately.  She states that Ciprodex did not work.  Past Medical History  Diagnosis Date  . Herpes, genital   . Hypertension   . Pregnancy induced hypertension   . Obesity   . Preeclampsia, severe 2012    lead to C section.  . Pancreatitis   . H/O varicella   . Yeast infection   . Trichomonas     Past Surgical History  Procedure Date  . Cholecystectomy 07/05/2011    Procedure: LAPAROSCOPIC CHOLECYSTECTOMY WITH INTRAOPERATIVE CHOLANGIOGRAM;  Surgeon: Ernestene Mention, MD;  Location: [facility] OR;  Service: General;  Laterality: N/A;  . Ercp 07/09/2011    Procedure: ENDOSCOPIC RETROGRADE CHOLANGIOPANCREATOGRAPHY (ERCP);  Surgeon: Louis Meckel, MD;  Location: [facility] OR;  Service: Endoscopy;  Laterality: N/A;  . Cesarean section 05/03/2011    Procedure: CESAREAN SECTION;  Surgeon: Janine Limbo, MD;  Location: [facility] ORS;  Service: Gynecology;  Laterality: N/A;    Family History  Problem Relation Age of Onset  . Diabetes Mother   . Obesity Mother   . Asthma Sister   . Obesity Sister   . Diabetes Maternal Aunt   . Obesity Maternal Aunt   . Diabetes Maternal Grandmother   . Anesthesia problems Neg Hx     History  Substance Use Topics  . Smoking status: Former Smoker -- 2 years    Types: Cigarettes    Quit date: 10/13/2010  . Smokeless tobacco:  Never Used  . Alcohol Use: No    OB History    Grav Para Term Preterm Abortions TAB SAB Ect Mult Living   1 1 0 1 0 0 0 0 0 1       Review of Systems All other systems negative except as documented in the HPI. All pertinent positives and negatives as reviewed in the HPI. Allergies  Review of patient's allergies indicates no known allergies.  Home Medications   Current Outpatient Rx  Name  Route  Sig  Dispense  Refill  . ACYCLOVIR 400 MG PO TABS   Oral   Take 400 mg by mouth daily as needed. For outbreak         . MEDROXYPROGESTERONE ACETATE 150 MG/ML IM SUSP   Intramuscular   Inject 1 mL (150 mg total) into the muscle every 3 (three) months.   1 mL   3     Give first dose prior to discharge home.     There were no vitals taken for this visit.  Physical Exam  Constitutional: She appears well-developed and well-nourished. She is cooperative. No distress.  HENT:  Right Ear: There is drainage and tenderness. No mastoid tenderness. Tympanic membrane is injected and scarred. Tympanic membrane is not perforated.  Left Ear: No mastoid tenderness. Tympanic membrane is injected and scarred. Tympanic membrane is not perforated.  Ears:  Abdominal: Normal appearance and bowel sounds are  normal. There is tenderness in the epigastric area. There is no rebound and no guarding.    Neurological: She is alert.    ED Course  Procedures (including critical care time)  Labs Reviewed  CBC WITH DIFFERENTIAL - Abnormal; Notable for the following:    WBC 10.6 (*)     All other components within normal limits  COMPREHENSIVE METABOLIC PANEL - Abnormal; Notable for the following:    Glucose, Bld 100 (*)     All other components within normal limits  LIPASE, BLOOD - Abnormal; Notable for the following:    Lipase 10 (*)     All other components within normal limits  URINALYSIS, MICROSCOPIC ONLY - Abnormal; Notable for the following:    APPearance CLOUDY (*)     Leukocytes, UA TRACE  (*)     Bacteria, UA FEW (*)     Squamous Epithelial / LPF MANY (*)     All other components within normal limits  POCT PREGNANCY, URINE  URINE CULTURE   Will refer the patient to ENT, and will give oral antibiotics.  Told to use warm compresses.   MDM         Carlyle Dolly, PA-C 05/22/12 0124

## 2012-05-23 LAB — URINE CULTURE

## 2012-05-24 IMAGING — US US OB FOLLOW-UP
1 series · 13 of 23 positions shown · non-contrast
Comparison: none

[Series 1: us ob follow up · 13 of 23 slices shown]
[im 1/23]
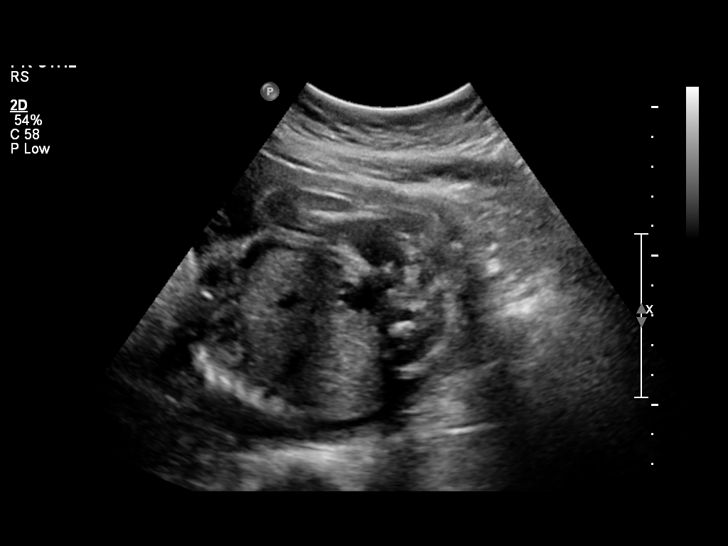
[im 3/23]
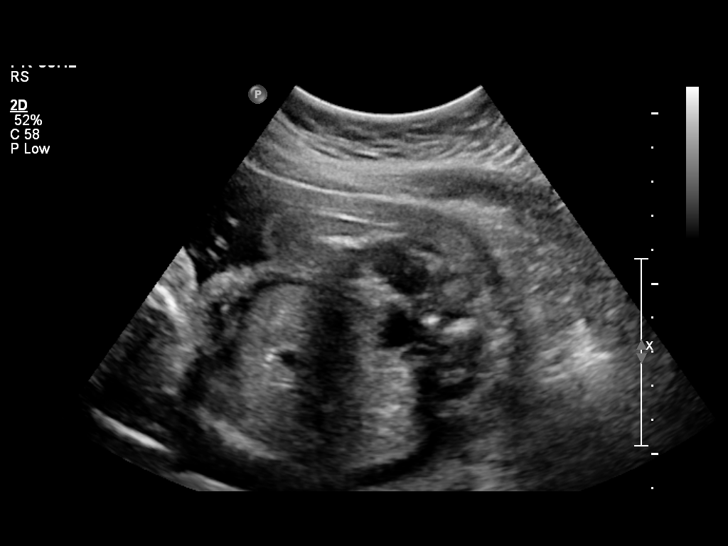
[im 5/23]
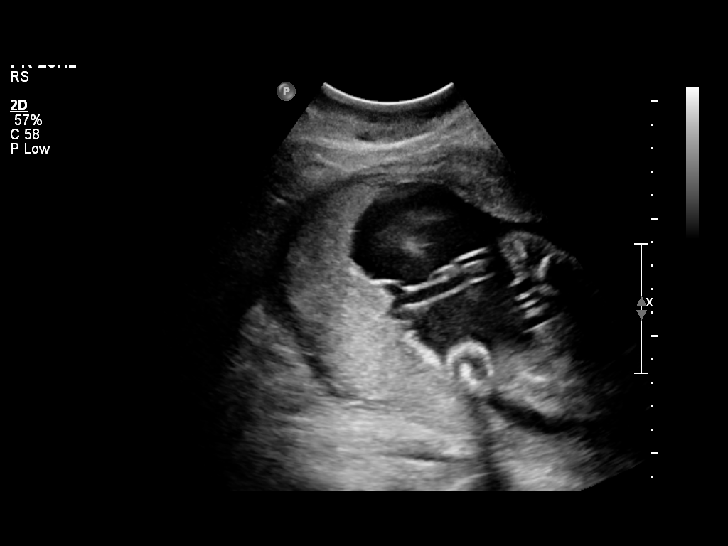
[im 7/23]
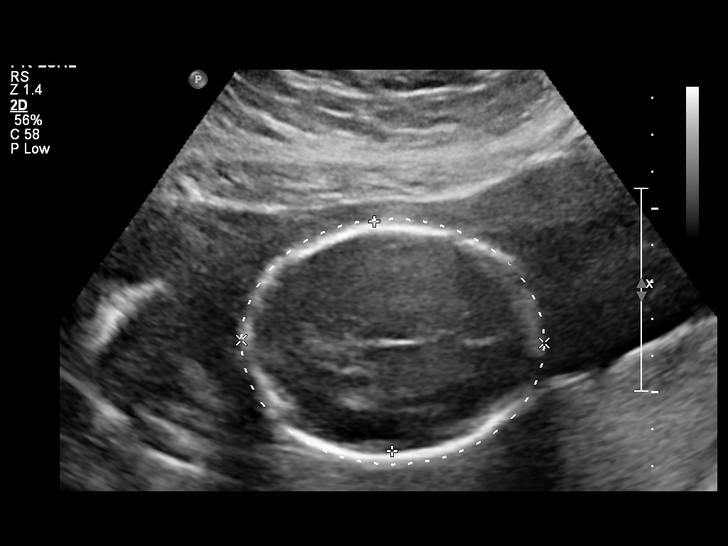
[im 8/23]
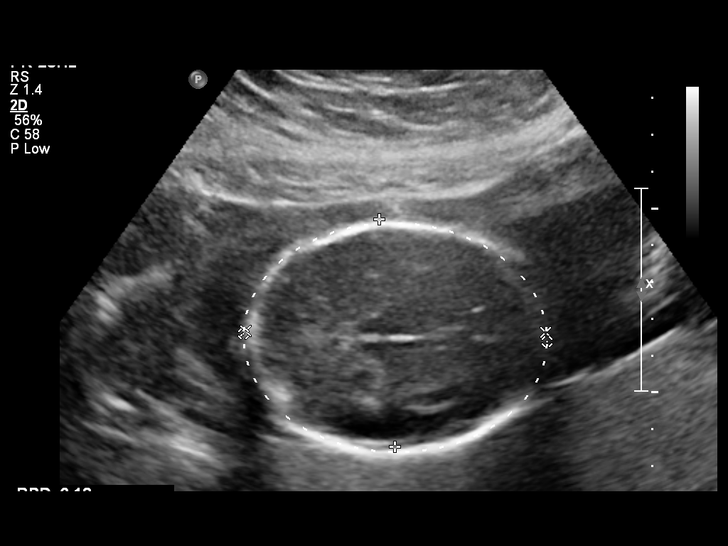
[im 10/23]
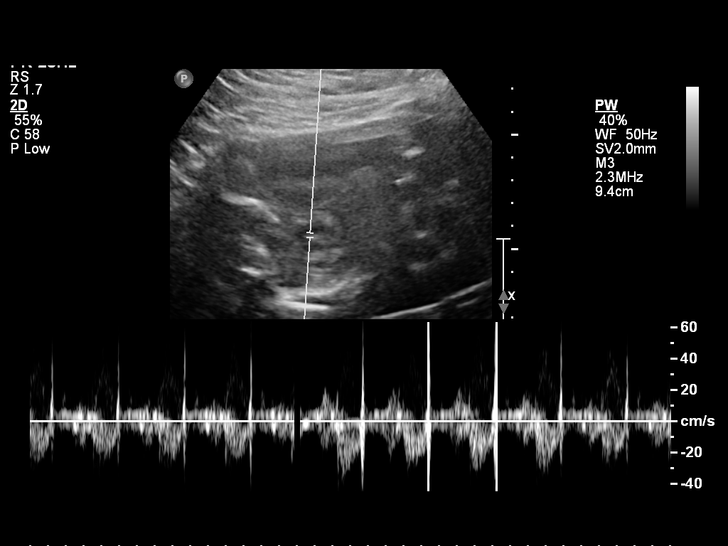
[im 12/23]
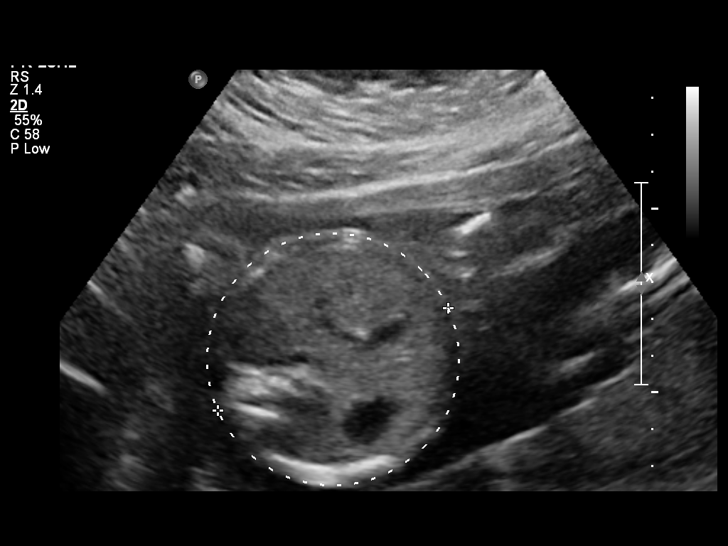
[im 14/23]
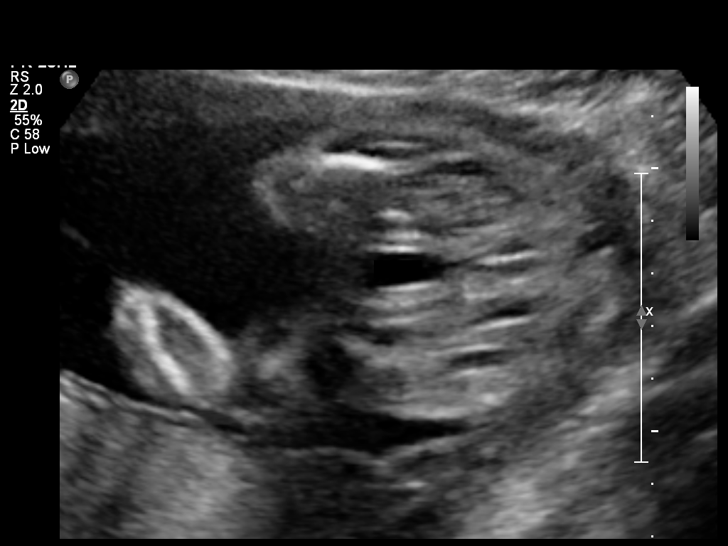
[im 16/23]
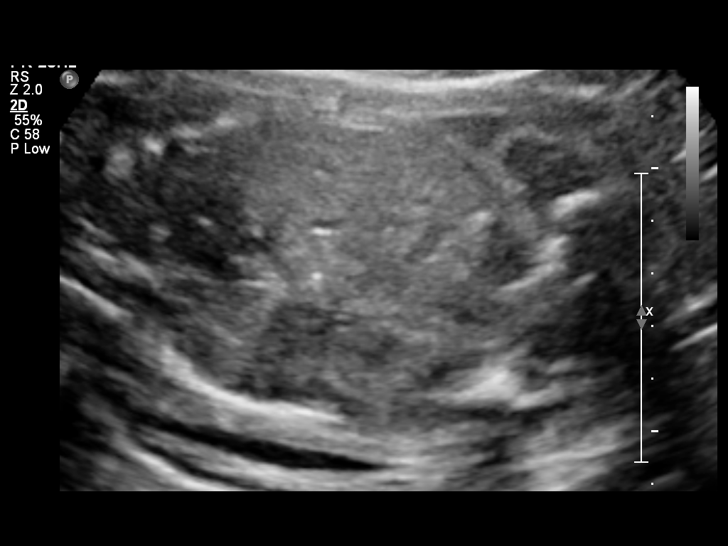
[im 17/23]
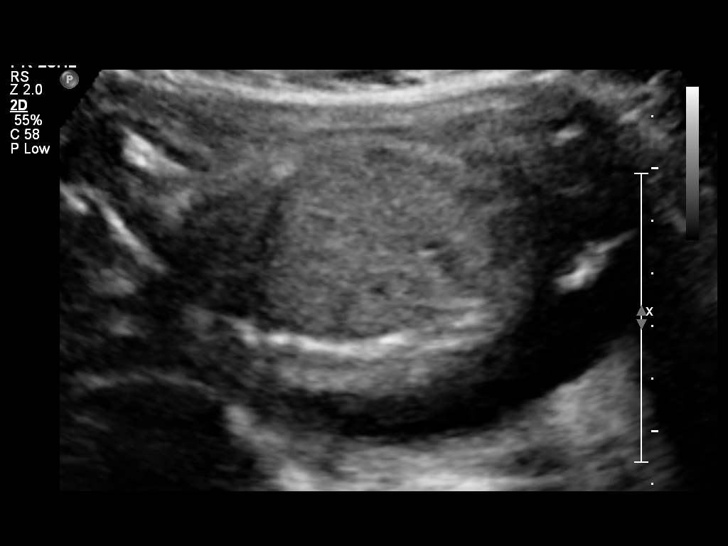
[im 19/23]
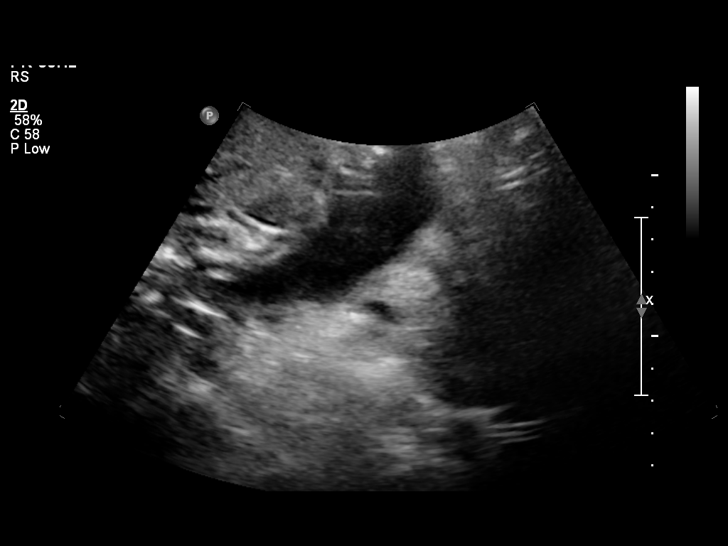
[im 21/23]
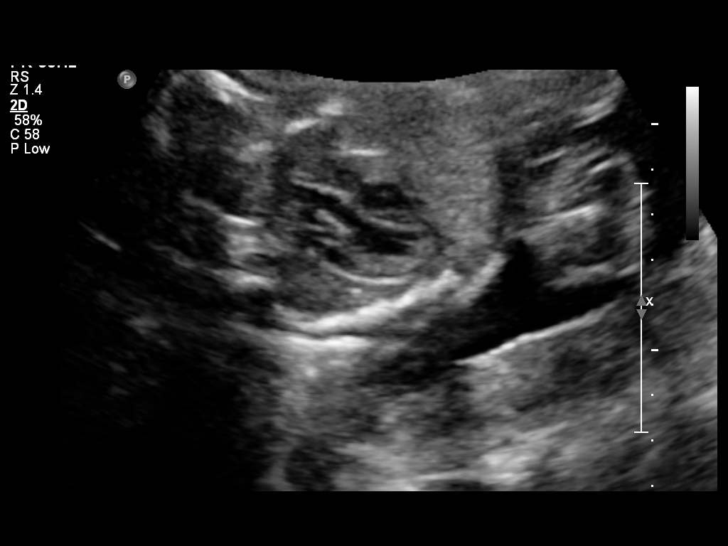
[im 23/23]
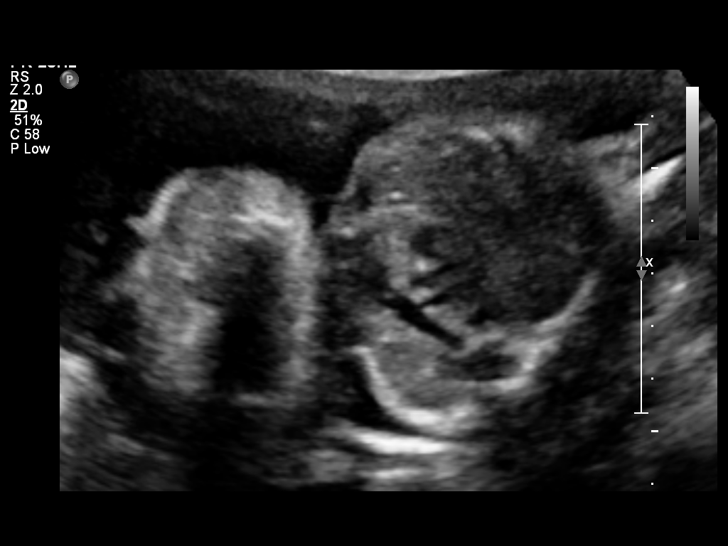

[13 of 23 positions shown; findings below may reference images not displayed]

OBSTETRICS REPORT
                      (Signed Final 03/05/2011 [DATE])

 Order#:         [id number]
Procedures

 US OB FOLLOW UP                                       76816.1
Indications

 Assess Fetal Growth / Estimated Fetal Weight
 Obesity
 F/u detailed fetal anatomic survey (complete
 anatomy)
 Herpes simplex virus (KO)
Fetal Evaluation

 Fetal Heart Rate:  156                          bpm
 Cardiac Activity:  Observed
 Presentation:      Breech
 Placenta:          Posterior Fundal, above
                    cervical os

 Amniotic Fluid
 AFI FV:      Subjectively within normal limits
                                             Larg Pckt:     4.4  cm
Biometry

 BPD:     62.1  mm     G. Age:  25w 2d                CI:         76.4   70 - 86
 OFD:     81.3  mm                                    FL/HC:      20.5   18.7 -

 HC:     231.9  mm     G. Age:  25w 1d       32  %    HC/AC:      1.08   1.04 -

 AC:     214.5  mm     G. Age:  26w 0d       66  %    FL/BPD:     76.5   71 - 87
 FL:      47.5  mm     G. Age:  25w 6d       59  %    FL/AC:      22.1   20 - 24

 Est. FW:     855  gm    1 lb 14 oz      66  %
Gestational Age

 LMP:           24w 3d        Date:  09/15/10                 EDD:   06/22/11
 U/S Today:     25w 4d                                        EDD:   06/14/11
 Best:          25w 1d     Det. By:  U/S C R L (11/02/10)     EDD:   06/17/11
Anatomy
 Cranium:           Appears normal      Aortic Arch:       Appears normal
 Fetal Cavum:       Appears normal      Ductal Arch:       Not well
                                                           visualized
 Ventricles:        Appears normal      Diaphragm:         Appears normal
 Choroid Plexus:    Previously seen     Stomach:           Appears
                                                           normal, left
                                                           sided
 Cerebellum:        Previously seen     Abdomen:           Appears normal
 Posterior Fossa:   Previously seen     Abdominal Wall:    Previously seen
 Nuchal Fold:       Not applicable      Cord Vessels:      Previously seen
                    (>20 wks GA)
 Face:              Previously seen     Kidneys:           Appear normal
 Heart:             Appears normal      Bladder:           Appears normal
                    (4 chamber &
                    axis)
 RVOT:              Appears normal      Spine:             Previously seen
 LVOT:              Appears normal      Limbs:             Previously seen
Cervix Uterus Adnexa

 Cervical Length:    4.8      cm

 Cervix:       Normal appearance by transabdominal scan.
Impression

 Assigned GA is currently 25w 1d.   Appropriate interval fetal
 growth.
 No fetal anomalies seen involving visualized anatomy.
 Cardiac outflow tracts were visualized on today's exam.
 Normal amniotic fluid volume. Normal cervical length.

## 2012-05-24 NOTE — ED Notes (Signed)
+  Urine. Patient given Doxycycline. No sensitivity listed. Chart sent to EDP office for review. °

## 2012-05-30 NOTE — ED Notes (Signed)
Only 20,000 colonies

## 2012-07-21 ENCOUNTER — Ambulatory Visit: Payer: Medicaid Other

## 2012-07-24 ENCOUNTER — Ambulatory Visit (INDEPENDENT_AMBULATORY_CARE_PROVIDER_SITE_OTHER): Payer: Medicaid Other | Admitting: *Deleted

## 2012-07-24 DIAGNOSIS — Z309 Encounter for contraceptive management, unspecified: Secondary | ICD-10-CM

## 2012-07-24 MED ORDER — MEDROXYPROGESTERONE ACETATE 150 MG/ML IM SUSP
150.0000 mg | Freq: Once | INTRAMUSCULAR | Status: AC
  Administered 2012-07-24: 150 mg via INTRAMUSCULAR

## 2012-07-24 NOTE — Progress Notes (Signed)
Next depo due May 8 thru Oct 23, 2012.

## 2012-08-24 ENCOUNTER — Encounter (HOSPITAL_COMMUNITY): Payer: Self-pay | Admitting: *Deleted

## 2012-08-24 ENCOUNTER — Emergency Department (HOSPITAL_COMMUNITY)
Admission: EM | Admit: 2012-08-24 | Discharge: 2012-08-24 | Disposition: A | Discharge status: Home / Self Care | Payer: Medicaid Other | Source: Home / Self Care

## 2012-08-24 DIAGNOSIS — R21 Rash and other nonspecific skin eruption: Secondary | ICD-10-CM

## 2012-08-24 MED ORDER — TRIAMCINOLONE ACETONIDE 0.025 % EX OINT
TOPICAL_OINTMENT | Freq: Two times a day (BID) | CUTANEOUS | Status: DC
Start: 2012-08-24 — End: 2015-09-13

## 2012-08-24 NOTE — ED Notes (Signed)
Patient complains of red rash on left posterior shoulder x 2 days.

## 2012-08-24 NOTE — ED Provider Notes (Signed)
Andrea Morton is a 22 y.o. female who presents to [facility] today for rash. Patient notes puritic papules on her posterior left shoulder. She denies any change in shampoos lotions detergents etc. she denies any flulike illness fevers chills and feels well otherwise. She's tried some zinc oxide ointment as well as some antifungal ointment which has not helped. The rash has been present for 2 days. She feels well otherwise.    PMH reviewed.  History  Substance Use Topics  . Smoking status: Former Smoker -- 2 years    Types: Cigarettes    Quit date: 10/13/2010  . Smokeless tobacco: Never Used  . Alcohol Use: No   ROS as above otherwise neg   Exam:  BP 137/90  Pulse 78  Temp(Src) 98.9 F (37.2 C) (Oral)  Resp 18  SpO2 100%  Breastfeeding? Unknown Gen: Well NAD Skin: Small erythematous group of papules in posterior shoulder some excoriated no vesicles. Nontender.   Assessment and plan:  Puritic papular rash. Likely contact dermatitis. No systemic illness.  Plan: Triamcinolone ointment and Benadryl at night.  Follow up with primary care Dr..   Discussed warning signs or symptoms. Please see discharge instructions. Patient expresses understanding.     Rodolph Bong, MD 08/24/12 1245

## 2012-08-27 ENCOUNTER — Ambulatory Visit: Payer: Medicaid Other | Admitting: Family Medicine

## 2012-09-02 NOTE — ED Provider Notes (Signed)
Medical screening examination/treatment/procedure(s) were performed by resident physician or non-physician practitioner and as supervising physician I was immediately available for consultation/collaboration.   Cassell Voorhies DOUGLAS MD.   Kassity Woodson D Laylani Pudwill, MD 09/02/12 1011 

## 2012-09-03 ENCOUNTER — Encounter: Payer: Self-pay | Admitting: Family Medicine

## 2012-09-03 ENCOUNTER — Ambulatory Visit (INDEPENDENT_AMBULATORY_CARE_PROVIDER_SITE_OTHER): Payer: Medicaid Other | Admitting: Family Medicine

## 2012-09-03 VITALS — BP 130/76 | HR 60 | Temp 98.1°F | Ht 71.0 in | Wt 344.0 lb

## 2012-09-03 DIAGNOSIS — Z30017 Encounter for initial prescription of implantable subdermal contraceptive: Secondary | ICD-10-CM

## 2012-09-03 DIAGNOSIS — Z3046 Encounter for surveillance of implantable subdermal contraceptive: Secondary | ICD-10-CM

## 2012-09-03 DIAGNOSIS — Z309 Encounter for contraceptive management, unspecified: Secondary | ICD-10-CM

## 2012-09-03 MED ORDER — ETONOGESTREL 68 MG ~~LOC~~ IMPL
68.0000 mg | DRUG_IMPLANT | Freq: Once | SUBCUTANEOUS | Status: AC
  Administered 2012-09-03: 68 mg via SUBCUTANEOUS

## 2012-09-03 MED ORDER — ETONOGESTREL 68 MG ~~LOC~~ IMPL: 1.0000 | DRUG_IMPLANT | Freq: Once | SUBCUTANEOUS | Status: AC

## 2012-09-03 NOTE — Progress Notes (Signed)
PROCEDURE NOTE: Nexplanon insertion Patient given informed consent, signed copy in the chart.  Appropriate time out taken  Pregnancy test was Not necessary as patient up to date on Depo.  The patient's  left  arm was prepped in the usual sterile fashion.. The ruler used to measure and mark the insertion area 8 cm from medial epicondyle of the elbow. Local anaesthesia obtained using 3 cc of 1% lidocaine with epinephrine. Nexplanon was inserted per manufacturer's directions. Less than 3 cc blood loss. The insertion site covered with antibiotic ointment and a pressure bandage to minimize bruising. There were no complications and the patient tolerated the procedure well.  Device information was given in handout form. Patient is informed the removal date will be in three years.

## 2012-09-03 NOTE — Patient Instructions (Signed)
Contraceptive Implant Information A contraceptive implant is a plastic rod that is inserted under the skin. It is usually inserted under the skin of your upper arm. It continually releases small amounts of progestin (synthetic progesterone) into the bloodstream. This prevents an egg from being released from the ovary. It also thickens the cervical mucus to prevent sperm from entering the cervix, and it thins the uterine lining to prevent a fertilized egg from attaching to the uterus. They can be effective for up to 3 years. Implants do not provide protection against sexually transmitted diseases (STDs).  The procedure to insert an implant usually takes about 10 minutes. There may be minor bruising, swelling, and discomfort at the insertion site for a couple days. The implant begins to work within the first day. Other contraceptive protection should be used for 2 weeks. Follow up with your caregiver to get rechecked as directed. Your caregiver will make sure you are a good candidate for the contraceptive implant. Discuss with your caregiver the possible side effects of the implant ADVANTAGES  It prevents pregnancy for up to 3 years.  It is easily reversible.  It is convenient.  The progestins may protect against uterine and ovarian cancer.  It can be used when breastfeeding.  It can be used by women who cannot take estrogen. DISADVANTAGES  You may have irregular or unplanned vaginal bleeding.  You may develop side effects, including headache, weight gain, acne, breast tenderness, or mood changes.  You may have tissue or nerve damage after insertion (rare).  It may be difficult and uncomfortable to remove.  Certain medications may interfere with the effectiveness of the implants. REMOVAL OF IMPLANT The implant should be removed in 3 years or as directed by your caregiver. The implants effect wears off in a few hours after removal. Your ability to get pregnant (fertility) is restored  within a couple of weeks. New implants can be inserted as soon as the old ones are removed if desired. DO NOT GET THE IMPLANT IF:   You are pregnant.  You have a history of breast cancer, osteoporosis, blood clots, heart disease, diabetes, high blood pressure, liver disease, tumors, or stroke.   You have undiagnosed vaginal bleeding.  You have overly sensitive to certain parts of the implant. Document Released: 05/10/2011 Document Revised: 08/13/2011 Document Reviewed: 05/10/2011 [employer] Patient Information 2013 [employer].  

## 2013-02-24 ENCOUNTER — Telehealth: Payer: Self-pay | Admitting: Family Medicine

## 2013-03-11 NOTE — Telephone Encounter (Signed)
error 

## 2013-03-25 ENCOUNTER — Telehealth: Payer: Self-pay | Admitting: Family Medicine

## 2013-03-27 MED ORDER — ACYCLOVIR 400 MG PO TABS: 400.0000 mg | ORAL_TABLET | Freq: Every day | ORAL | Status: DC | PRN

## 2013-03-27 NOTE — Telephone Encounter (Signed)
Will forward to MD. Jazmin Hartsell,CMA  

## 2013-03-27 NOTE — Telephone Encounter (Signed)
Pt states she had made numerous calls requesting refill on acyclovoir She has sores and needs this  [facility] at the [address] of [address] Please call her when the prescription is called in

## 2013-03-27 NOTE — Telephone Encounter (Signed)
I do not see any telephone encounters about this.  Please let patient know that I am sending in rx. Patient has herpes in problem list. I have not met her yet so I do want to see her in clinic though. Please ask her to make an appt.  Leona Singleton, MD

## 2013-03-27 NOTE — Telephone Encounter (Signed)
Number does not accept incoming calls. Fleeger, Maryjo [place]

## 2014-04-05 ENCOUNTER — Encounter: Payer: Self-pay | Admitting: Family Medicine

## 2015-07-25 ENCOUNTER — Ambulatory Visit: Payer: Medicaid Other

## 2015-09-13 ENCOUNTER — Encounter: Payer: Self-pay | Admitting: Family Medicine

## 2015-09-13 ENCOUNTER — Ambulatory Visit (INDEPENDENT_AMBULATORY_CARE_PROVIDER_SITE_OTHER): Payer: Medicaid Other | Admitting: Family Medicine

## 2015-09-13 VITALS — BP 127/67 | HR 82 | Temp 98.0°F | Wt 243.0 lb

## 2015-09-13 DIAGNOSIS — Z3046 Encounter for surveillance of implantable subdermal contraceptive: Secondary | ICD-10-CM

## 2015-09-13 DIAGNOSIS — Z975 Presence of (intrauterine) contraceptive device: Secondary | ICD-10-CM | POA: Diagnosis not present

## 2015-09-13 DIAGNOSIS — Z0000 Encounter for general adult medical examination without abnormal findings: Secondary | ICD-10-CM

## 2015-09-13 DIAGNOSIS — Z3009 Encounter for other general counseling and advice on contraception: Secondary | ICD-10-CM

## 2015-09-13 DIAGNOSIS — Z30017 Encounter for initial prescription of implantable subdermal contraceptive: Secondary | ICD-10-CM

## 2015-09-13 DIAGNOSIS — Z3040 Encounter for surveillance of contraceptives, unspecified: Secondary | ICD-10-CM

## 2015-09-13 LAB — POCT URINE PREGNANCY: Preg Test, Ur: NEGATIVE

## 2015-09-13 MED ORDER — ACYCLOVIR 400 MG PO TABS: 400.0000 mg | ORAL_TABLET | Freq: Every day | ORAL | Status: DC | PRN

## 2015-09-13 NOTE — Assessment & Plan Note (Signed)
Patient reports [place] pap 8-9 [place] ago in [place]. Asked patient to have records faxed to this office.

## 2015-09-13 NOTE — Assessment & Plan Note (Addendum)
Nexplanon removed and re-inserted today. See procedure note above.  Follow up in 1 year for annual exam.

## 2015-09-13 NOTE — Progress Notes (Signed)
Subjective:  Andrea Morton is a 25 y.o. female who presents to the [facility] today with a chief complaint of contraception management.   HPI:  Contraception Management Patient presents for nexplanon removal and reinsertion. Patient not interested in other forms of birth control. No vaginal discharge or bleeding. No abdominal pain. No fevers or chills.     ROS: Per HPI  PMH:  The following were reviewed and entered/updated in epic: Past Medical History  Diagnosis Date  . Herpes, genital   . Hypertension   . Pregnancy induced hypertension   . Obesity   . Preeclampsia, severe 2012    lead to C section.  . Pancreatitis   . H/O varicella   . Yeast infection   . Trichomonas    Patient Active Problem List   Diagnosis Date Noted  . Nexplanon in place 09/13/2015  . Healthcare maintenance 09/13/2015  . Well woman exam with routine gynecological exam 05/05/2012  . Skin lesion of left arm 08/28/2011  . ASCUS on Pap smear 11/07/2010  . GENITAL HERPES 06/19/2010  . TOBACCO USER 02/19/2009  . HTN (hypertension), benign 01/28/2008  . OBESITY, CLASS III 08/01/2006   Past Surgical History  Procedure Laterality Date  . Cholecystectomy  07/05/2011    Procedure: LAPAROSCOPIC CHOLECYSTECTOMY WITH INTRAOPERATIVE CHOLANGIOGRAM;  Surgeon: Ernestene MentionHaywood M Ingram, MD;  [facility];  Service: General;  Laterality: N/A;  . Ercp  07/09/2011    Procedure: ENDOSCOPIC RETROGRADE CHOLANGIOPANCREATOGRAPHY (ERCP);  Surgeon: Louis Meckelobert D Kaplan, MD;  [facility];  Service: Endoscopy;  Laterality: N/A;  . Cesarean section  05/03/2011    Procedure: CESAREAN SECTION;  Surgeon: Janine LimboArthur V Stringer, MD;  Location: [facility] ORS;  Service: Gynecology;  Laterality: N/A;    Family History  Problem Relation Age of Onset  . Diabetes Mother   . Obesity Mother   . Asthma Sister   . Obesity Sister   . Diabetes Maternal Aunt   . Obesity Maternal Aunt   . Diabetes Maternal Grandmother   . Anesthesia problems Neg Hx       Medications- reviewed and updated Current Outpatient Prescriptions  Medication Sig Dispense Refill  . acyclovir (ZOVIRAX) 400 MG tablet Take 1 tablet (400 mg total) by mouth daily as needed. For outbreak 30 tablet 1  . etonogestrel (NEXPLANON) 68 MG IMPL implant Inject 1 each (68 mg total) into the skin once. 1 each 0   No current facility-administered medications for this visit.    Allergies-reviewed and updated No Known Allergies  Social History   Social History  . Marital Status: Single    Spouse Name: N/A  . Number of Children: N/A  . Years of Education: N/A   Social History Main Topics  . Smoking status: Current Every Day Smoker -- 2 years    Types: Cigarettes    Last Attempt to Quit: 10/13/2010  . Smokeless tobacco: Never Used  . Alcohol Use: No  . Drug Use: No  . Sexual Activity: Not Asked   Other Topics Concern  . None   Social History Narrative   Unemployed, finished high school.   1 child- 2 months old.    No exercise.    Doesn't eat a healthy diet.      Objective:  Physical Exam: BP 127/67 mmHg  Pulse 82  Temp(Src) 98 F (36.7 C) (Oral)  Wt 243 lb (110.224 kg)  Gen: NAD, resting comfortably CV: RRR with no murmurs appreciated Pulm: NWOB, CTAB with no crackles, wheezes, or  rhonchi GI: Normal bowel sounds present. Soft, Nontender, Nondistended. MSK: no edema, cyanosis, or clubbing noted Skin: warm, dry Neuro: grossly normal, moves all extremities Psych: Normal affect and thought content  Procedure Note  Patient given informed consent, signed copy in the chart, time out was performed. Pregnancy test was negative.  Removal Patient given informed consent for removal of her nexplanon, time out was performed.  Signed copy in the chart.  Appropriate time out taken. Implanon site identified.  Area prepped in usual sterile fashon. Five cc's of 1% lidocaine was used to anesthetize the area at the distal end of the implant. A small stab incision was  made right beside the implant on the distal portion.  The implanon rod was grasped using hemostats and removed without difficulty.  There was less than 3 cc blood loss.   Insertion Patient's left arm was prepped and draped in the usual sterile fashion.  Pt was prepped with alcohol swab and then injected with 10 cc of 1% lidocaine with epinephrine.  Pt was prepped with betadine, Nexplanon removed form packaging,  Device confirmed in needle, then inserted full length of needle and withdrawn per handbook instructions.  Pt insertion site covered with steristrips.   Minimal blood loss.  Pt tolerated the procedure well.   Assessment/Plan:  Nexplanon in place Nexplanon removed and re-inserted today. See procedure note above.  Follow up in 1 year for annual exam.  ASCUS on Pap smear Patient reports normal pap 8-9 months ago in [place]. Asked patient to have records faxed to this office.   Katina Degree. Jimmey Ralph, MD [facility] Resident PGY-2 09/13/2015 3:59 PM

## 2015-09-13 NOTE — Patient Instructions (Signed)
Etonogestrel implant What is this medicine? ETONOGESTREL (et oh noe JES trel) is a contraceptive (birth control) device. It is used to prevent pregnancy. It can be used for up to 3 years. This medicine may be used for other purposes; ask your health care provider or pharmacist if you have questions. What should I tell my health care provider before I take this medicine? They need to know if you have any of these conditions: -abnormal vaginal bleeding -blood vessel disease or blood clots -cancer of the breast, cervix, or liver -depression -diabetes -gallbladder disease -headaches -heart disease or recent heart attack -high blood pressure -high cholesterol -kidney disease -liver disease -renal disease -seizures -tobacco smoker -an unusual or allergic reaction to etonogestrel, other hormones, anesthetics or antiseptics, medicines, foods, dyes, or preservatives -pregnant or trying to get pregnant -breast-feeding How should I use this medicine? This device is inserted just under the skin on the inner side of your upper arm by a health care professional. Talk to your pediatrician regarding the use of this medicine in children. Special care may be needed. Overdosage: If you think you have taken too much of this medicine contact a poison control center or emergency room at once. NOTE: This medicine is only for you. Do not share this medicine with others. What if I miss a dose? This does not apply. What may interact with this medicine? Do not take this medicine with any of the following medications: -amprenavir -bosentan -fosamprenavir This medicine may also interact with the following medications: -barbiturate medicines for inducing sleep or treating seizures -certain medicines for fungal infections like ketoconazole and itraconazole -griseofulvin -medicines to treat seizures like carbamazepine, felbamate, oxcarbazepine, phenytoin,  topiramate -modafinil -phenylbutazone -rifampin -some medicines to treat HIV infection like atazanavir, indinavir, lopinavir, nelfinavir, tipranavir, ritonavir -St. John's wort This list may not describe all possible interactions. Give your health care provider a list of all the medicines, herbs, non-prescription drugs, or dietary supplements you use. Also tell them if you smoke, drink alcohol, or use illegal drugs. Some items may interact with your medicine. What should I watch for while using this medicine? This product does not protect you against HIV infection (AIDS) or other sexually transmitted diseases. You should be able to feel the implant by pressing your fingertips over the skin where it was inserted. Contact your doctor if you cannot feel the implant, and use a non-hormonal birth control method (such as condoms) until your doctor confirms that the implant is in place. If you feel that the implant may have broken or become bent while in your arm, contact your healthcare provider. What side effects may I notice from receiving this medicine? Side effects that you should report to your doctor or health care professional as soon as possible: -allergic reactions like skin rash, itching or hives, swelling of the face, lips, or tongue -breast lumps -changes in emotions or moods -depressed mood -heavy or prolonged menstrual bleeding -pain, irritation, swelling, or bruising at the insertion site -scar at site of insertion -signs of infection at the insertion site such as fever, and skin redness, pain or discharge -signs of pregnancy -signs and symptoms of a blood clot such as breathing problems; changes in vision; chest pain; severe, sudden headache; pain, swelling, warmth in the leg; trouble speaking; sudden numbness or weakness of the face, arm or leg -signs and symptoms of liver injury like dark yellow or brown urine; general ill feeling or flu-like symptoms; light-colored stools; loss of  appetite; nausea; right upper belly   pain; unusually weak or tired; yellowing of the eyes or skin -unusual vaginal bleeding, discharge -signs and symptoms of a stroke like changes in vision; confusion; trouble speaking or understanding; severe headaches; sudden numbness or weakness of the face, arm or leg; trouble walking; dizziness; loss of balance or coordination Side effects that usually do not require medical attention (Report these to your doctor or health care professional if they continue or are bothersome.): -acne -back pain -breast pain -changes in weight -dizziness -general ill feeling or flu-like symptoms -headache -irregular menstrual bleeding -nausea -sore throat -vaginal irritation or inflammation This list may not describe all possible side effects. Call your doctor for medical advice about side effects. You may report side effects to FDA at 1-800-FDA-1088. Where should I keep my medicine? This drug is given in a hospital or clinic and will not be stored at home. NOTE: This sheet is a summary. It may not cover all possible information. If you have questions about this medicine, talk to your doctor, pharmacist, or health care provider.    2016, Elsevier/Gold Standard. (2014-03-05 14:07:06)  

## 2015-09-14 MED ORDER — ETONOGESTREL 68 MG ~~LOC~~ IMPL
68.0000 mg | DRUG_IMPLANT | Freq: Once | SUBCUTANEOUS | Status: AC
  Administered 2015-09-13: 68 mg via SUBCUTANEOUS

## 2015-09-14 NOTE — Addendum Note (Signed)
Addended by: Jone BasemanFLEEGER, Anab Vivar D on: 09/14/2015 09:54 AM   Modules accepted: Orders

## 2015-10-12 ENCOUNTER — Ambulatory Visit (INDEPENDENT_AMBULATORY_CARE_PROVIDER_SITE_OTHER): Payer: Self-pay | Admitting: *Deleted

## 2015-10-12 DIAGNOSIS — Z111 Encounter for screening for respiratory tuberculosis: Secondary | ICD-10-CM

## 2015-10-12 NOTE — Progress Notes (Signed)
   PPD placed Left Forearm.  Pt to return 10/14/2015,Friday for reading.  Pt tolerated intradermal injection. Clovis PuMartin, Glayds Insco L, RN

## 2015-10-14 ENCOUNTER — Ambulatory Visit (INDEPENDENT_AMBULATORY_CARE_PROVIDER_SITE_OTHER): Payer: Self-pay | Admitting: *Deleted

## 2015-10-14 DIAGNOSIS — Z7689 Persons encountering health services in other specified circumstances: Secondary | ICD-10-CM

## 2015-10-14 DIAGNOSIS — Z111 Encounter for screening for respiratory tuberculosis: Secondary | ICD-10-CM

## 2015-10-14 LAB — TB SKIN TEST
Induration: 0 mm
TB SKIN TEST: NEGATIVE

## 2015-10-14 NOTE — Progress Notes (Signed)
   Patient ID: Andrea Morton, female   DOB: Aug 26, 1990, 25 y.o.   MRN: [id number]   PPD Reading Note PPD read and results entered in EpicCare. Result: 0 mm induration. Interpretation: Negative If test not read within 48-72 hours of initial placement, patient advised to repeat in other arm 1-3 weeks after this test. Allergic reaction: no  Clovis PuMartin, Saburo Luger L, RN

## 2015-10-18 ENCOUNTER — Other Ambulatory Visit: Payer: Self-pay | Admitting: Family Medicine

## 2015-10-18 MED ORDER — ACYCLOVIR 400 MG PO TABS: 400.0000 mg | ORAL_TABLET | Freq: Every day | ORAL | Status: DC | PRN

## 2015-10-18 NOTE — Telephone Encounter (Signed)
Informed pt of medication refill. Page, cma.

## 2015-10-18 NOTE — Telephone Encounter (Signed)
Needs refill on acyclovir  [address] [address]

## 2015-10-18 NOTE — Telephone Encounter (Signed)
Rx filled.  Katina Degreealeb M. Jimmey RalphParker, MD [facility] Resident PGY-2 10/18/2015 10:46 AM

## 2015-11-04 ENCOUNTER — Other Ambulatory Visit (HOSPITAL_COMMUNITY)
Admission: RE | Admit: 2015-11-04 | Discharge: 2015-11-04 | Disposition: A | Discharge status: Home / Self Care | Payer: Medicaid Other | Source: Ambulatory Visit | Attending: Family Medicine | Admitting: Family Medicine

## 2015-11-04 ENCOUNTER — Ambulatory Visit (INDEPENDENT_AMBULATORY_CARE_PROVIDER_SITE_OTHER): Payer: Medicaid Other | Admitting: Family Medicine

## 2015-11-04 ENCOUNTER — Encounter: Payer: Self-pay | Admitting: Family Medicine

## 2015-11-04 VITALS — BP 129/60 | HR 82 | Temp 98.6°F | Ht 71.0 in | Wt 250.0 lb

## 2015-11-04 DIAGNOSIS — Z01419 Encounter for gynecological examination (general) (routine) without abnormal findings: Secondary | ICD-10-CM | POA: Insufficient documentation

## 2015-11-04 DIAGNOSIS — Z0000 Encounter for general adult medical examination without abnormal findings: Secondary | ICD-10-CM

## 2015-11-04 NOTE — Patient Instructions (Signed)
Your pap test will come back next week. We will call you or send a letter with the results.  Please continue healthy behaviors such as healthy eating and daily exercise.  Come back in 1 year for your next check up, or sooner if you need anything else.  Take care,  Dr Jimmey RalphParker

## 2015-11-04 NOTE — Assessment & Plan Note (Signed)
Pap smear sent today. Discussed smoking cessation and healthy lifestyle behaviors including healthy diet and regular exercise.

## 2015-11-04 NOTE — Progress Notes (Signed)
Subjective:     Andrea Morton is a 25 y.o. female and is here for a comprehensive physical exam. The patient reports no problems.  Social History   Social History  . Marital Status: Single    Spouse Name: N/A  . Number of Children: N/A  . Years of Education: N/A   Occupational History  . Not on file.   Social History Main Topics  . Smoking status: Current Every Day Smoker -- 2 years    Types: Cigarettes    Last Attempt to Quit: 10/13/2010  . Smokeless tobacco: Never Used  . Alcohol Use: No  . Drug Use: No  . Sexual Activity: Not on file   Other Topics Concern  . Not on file   Social History Narrative   Unemployed, finished high school.   1 child- 2 months old.    No exercise.    Doesn't eat a healthy diet.    Health Maintenance  Topic Date Due  . PAP SMEAR  10/26/2013  . INFLUENZA VACCINE  01/03/2016  . TETANUS/TDAP  01/27/2018  . HIV Screening  Completed    The following portions of the patient's history were reviewed and updated as appropriate: allergies, current medications, past family history, past medical history, past social history, past surgical history and problem list.  Review of Systems A comprehensive review of systems was negative.   Objective:    BP 129/60 mmHg  Pulse 82  Temp(Src) 98.6 F (37 C) (Oral)  Ht 5\' 11"  (1.803 m)  Wt 250 lb (113.399 kg)  BMI 34.88 kg/m2 General appearance: alert, cooperative, appears stated age and no distress Head: Normocephalic, without obvious abnormality, atraumatic Eyes: conjunctivae/corneas clear. PERRL, EOM's intact. Fundi benign. Ears: normal TM's and external ear canals both ears Nose: Nares normal. Septum midline. Mucosa normal. No drainage or sinus tenderness. Throat: lips, mucosa, and tongue normal; teeth and gums normal Neck: no adenopathy, no carotid bruit, no JVD, supple, symmetrical, trachea midline and thyroid not enlarged, symmetric, no tenderness/mass/nodules Back: symmetric, no  curvature. ROM normal. No CVA tenderness. Lungs: clear to auscultation bilaterally Heart: regular rate and rhythm, S1, S2 normal, no murmur, click, rub or gallop Abdomen: soft, non-tender; bowel sounds normal; no masses,  no organomegaly Pelvic: cervix normal in appearance, external genitalia normal, no adnexal masses or tenderness, no cervical motion tenderness, rectovaginal septum normal, uterus normal size, shape, and consistency and vagina normal without discharge Extremities: extremities normal, atraumatic, no cyanosis or edema Pulses: 2+ and symmetric Skin: Skin color, texture, turgor normal. No rashes or lesions Lymph nodes: Cervical, supraclavicular, and axillary nodes normal. Neurologic: Grossly normal    Assessment:    Healthy female exam.      Plan:     See After Visit Summary for Counseling Recommendations   No problem-specific assessment & plan notes found for this encounter.

## 2015-11-04 NOTE — Addendum Note (Signed)
Addended by: Henri MedalHARTSELL, JAZMIN M on: 11/04/2015 03:50 PM   Modules accepted: Orders, SmartSet

## 2015-11-07 ENCOUNTER — Encounter (HOSPITAL_COMMUNITY): Payer: Self-pay | Admitting: Family Medicine

## 2015-11-07 LAB — CYTOLOGY - PAP

## 2016-03-22 ENCOUNTER — Encounter: Payer: Self-pay | Admitting: Family Medicine

## 2016-03-22 ENCOUNTER — Ambulatory Visit (INDEPENDENT_AMBULATORY_CARE_PROVIDER_SITE_OTHER): Payer: Medicaid Other | Admitting: Family Medicine

## 2016-03-22 VITALS — BP 135/80 | HR 73 | Temp 98.2°F | Ht 71.0 in | Wt 251.8 lb

## 2016-03-22 DIAGNOSIS — E669 Obesity, unspecified: Secondary | ICD-10-CM | POA: Diagnosis not present

## 2016-03-22 DIAGNOSIS — Z3009 Encounter for other general counseling and advice on contraception: Secondary | ICD-10-CM | POA: Diagnosis present

## 2016-03-22 DIAGNOSIS — Z3046 Encounter for surveillance of implantable subdermal contraceptive: Secondary | ICD-10-CM | POA: Diagnosis not present

## 2016-03-22 LAB — POCT URINE PREGNANCY: PREG TEST UR: NEGATIVE

## 2016-03-22 NOTE — Patient Instructions (Signed)

## 2016-03-22 NOTE — Progress Notes (Signed)
Subjective:     Patient ID: Andrea Morton, female   DOB: Jul 22, 1990, 25 y.o.   MRN: [id number]  HPI Obesity/Birth control counseling:Patient here for birth control counseling and weight management. 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 Patient felt she gained 30 pounds on nexplanon and she will like a birth control that would prevent her from gaining weight.  She stated she does portion control with her meal but she craves sugar a lot.  Current Outpatient Prescriptions on File Prior to Visit  Medication Sig Dispense Refill  . acyclovir (ZOVIRAX) 400 MG tablet Take 1 tablet (400 mg total) by mouth daily as needed. For outbreak 30 tablet 1  . etonogestrel (NEXPLANON) 68 MG IMPL implant Inject 1 each (68 mg total) into the skin once. 1 each 0   No current facility-administered medications on file prior to visit.    Past Medical History:  Diagnosis Date  . H/O varicella   . Herpes, genital   . Hypertension   . Obesity   . Pancreatitis   . Preeclampsia, severe 2012   lead to C section.  . Pregnancy induced hypertension   . Trichomonas   . Yeast infection    Vitals:   03/22/16 1014  BP: 135/80  Pulse: 73  Temp: 98.2 F (36.8 C)  TempSrc: Oral  Weight: 251 lb 12.8 oz (114.2 kg)  Height: 5\' 11"  (1.803 m)     Review of Systems  Constitutional: Positive for unexpected weight change. Negative for activity change and appetite change.  Respiratory: Negative.   Cardiovascular: Negative.   Gastrointestinal: Negative.   Genitourinary: Negative.   All other systems reviewed and are negative.      Objective:   Physical Exam  Constitutional: She is oriented to person, place, and time. She appears well-developed. No distress.  Pulmonary/Chest: Effort normal.  Neurological: She is alert and oriented to person, place, and time.  Psychiatric: She has a normal mood and affect.  Nursing note and vitals reviewed.       Assessment:     Contraception management Obesity    Plan:     I spent time reviewing her weight with her from March 2017 till now. She had only gained 8 lbs in total since she got her Nexplanon in not 30 lbs. Diet and exercise counseling done. I referred her to Dr. Gerilyn PilgrimSykes ( Nutritionist) for diet counseling and gave her Dr. Gerilyn PilgrimSykes card to call for her own appointment. At this point she declined Nexplanon removal given that she had not gained 30 lbs. F/U with us as needed.

## 2016-07-27 ENCOUNTER — Ambulatory Visit: Payer: Self-pay | Admitting: Family Medicine

## 2016-09-07 ENCOUNTER — Ambulatory Visit: Payer: Self-pay | Admitting: Family Medicine

## 2016-11-07 ENCOUNTER — Ambulatory Visit: Payer: Self-pay | Admitting: Student

## 2016-12-27 ENCOUNTER — Encounter (HOSPITAL_COMMUNITY): Payer: Self-pay

## 2016-12-27 ENCOUNTER — Emergency Department (HOSPITAL_COMMUNITY)
Admission: EM | Admit: 2016-12-27 | Discharge: 2016-12-27 | Disposition: A | Discharge status: Home / Self Care | Payer: Medicaid Other | Attending: Emergency Medicine | Admitting: Emergency Medicine

## 2016-12-27 DIAGNOSIS — Z5321 Procedure and treatment not carried out due to patient leaving prior to being seen by health care provider: Secondary | ICD-10-CM | POA: Insufficient documentation

## 2016-12-27 DIAGNOSIS — R109 Unspecified abdominal pain: Secondary | ICD-10-CM | POA: Insufficient documentation

## 2016-12-27 LAB — COMPREHENSIVE METABOLIC PANEL
ALT: 9 U/L — ABNORMAL LOW (ref 14–54)
ANION GAP: 6 (ref 5–15)
AST: 13 U/L — ABNORMAL LOW (ref 15–41)
Albumin: 3.8 g/dL (ref 3.5–5.0)
Alkaline Phosphatase: 62 U/L (ref 38–126)
BUN: 5 mg/dL — ABNORMAL LOW (ref 6–20)
CO2: 25 mmol/L (ref 22–32)
Calcium: 8.8 mg/dL — ABNORMAL LOW (ref 8.9–10.3)
Chloride: 106 mmol/L (ref 101–111)
Creatinine, Ser: 0.68 mg/dL (ref 0.44–1.00)
Glucose, Bld: 108 mg/dL — ABNORMAL HIGH (ref 65–99)
POTASSIUM: 3.8 mmol/L (ref 3.5–5.1)
Sodium: 137 mmol/L (ref 135–145)
Total Bilirubin: 0.5 mg/dL (ref 0.3–1.2)
Total Protein: 6.4 g/dL — ABNORMAL LOW (ref 6.5–8.1)

## 2016-12-27 LAB — CBC
HEMATOCRIT: 37.3 % (ref 36.0–46.0)
HEMOGLOBIN: 12.2 g/dL (ref 12.0–15.0)
MCH: 30.3 pg (ref 26.0–34.0)
MCHC: 32.7 g/dL (ref 30.0–36.0)
MCV: 92.6 fL (ref 78.0–100.0)
Platelets: 259 10*3/uL (ref 150–400)
RBC: 4.03 MIL/uL (ref 3.87–5.11)
RDW: 14.2 % (ref 11.5–15.5)
WBC: 9.2 10*3/uL (ref 4.0–10.5)

## 2016-12-27 LAB — URINALYSIS, ROUTINE W REFLEX MICROSCOPIC
Bilirubin Urine: NEGATIVE
Glucose, UA: NEGATIVE mg/dL
Hgb urine dipstick: NEGATIVE
KETONES UR: NEGATIVE mg/dL
LEUKOCYTES UA: NEGATIVE
NITRITE: NEGATIVE
PH: 6 (ref 5.0–8.0)
Protein, ur: NEGATIVE mg/dL
Specific Gravity, Urine: 1.009 (ref 1.005–1.030)

## 2016-12-27 LAB — POC URINE PREG, ED: Preg Test, Ur: NEGATIVE

## 2016-12-27 LAB — LIPASE, BLOOD: Lipase: 22 U/L (ref 11–51)

## 2016-12-27 NOTE — ED Notes (Signed)
No answer when called to update vital signs, x 2.

## 2016-12-27 NOTE — ED Notes (Signed)
No answer from patient when called for vital signs x 2.

## 2016-12-27 NOTE — ED Triage Notes (Signed)
Pt here for abd pain, hx of pancreatitis, sts it feels like it is her pancreas today.

## 2017-02-20 ENCOUNTER — Other Ambulatory Visit: Payer: Self-pay | Admitting: *Deleted

## 2017-02-20 MED ORDER — ACYCLOVIR 400 MG PO TABS: 400.0000 mg | ORAL_TABLET | Freq: Every day | ORAL | 1 refills | Status: DC | PRN

## 2017-02-21 MED ORDER — ACYCLOVIR 400 MG PO TABS: 400.0000 mg | ORAL_TABLET | Freq: Every day | ORAL | 1 refills | Status: DC | PRN

## 2017-02-21 NOTE — Addendum Note (Signed)
Addended by: Jone Baseman D on: 02/21/2017 09:22 AM   Modules accepted: Orders

## 2017-02-21 NOTE — Telephone Encounter (Signed)
Pt needed Rx called to [facility], resent as requested.   Shanaiya Bene, Maryjo Rochester, CMA

## 2017-03-21 ENCOUNTER — Ambulatory Visit (INDEPENDENT_AMBULATORY_CARE_PROVIDER_SITE_OTHER): Payer: Self-pay | Admitting: Family Medicine

## 2017-03-21 ENCOUNTER — Encounter: Payer: Self-pay | Admitting: Family Medicine

## 2017-03-21 VITALS — BP 118/68 | HR 86 | Temp 98.9°F | Ht 71.0 in | Wt 273.8 lb

## 2017-03-21 DIAGNOSIS — S80862A Insect bite (nonvenomous), left lower leg, initial encounter: Secondary | ICD-10-CM

## 2017-03-21 DIAGNOSIS — W57XXXA Bitten or stung by nonvenomous insect and other nonvenomous arthropods, initial encounter: Secondary | ICD-10-CM | POA: Insufficient documentation

## 2017-03-21 MED ORDER — HYDROXYZINE HCL 10 MG PO TABS: 10.0000 mg | ORAL_TABLET | Freq: Three times a day (TID) | ORAL | 0 refills | Status: DC | PRN

## 2017-03-21 MED ORDER — TRIAMCINOLONE ACETONIDE 0.1 % EX CREA: 1.0000 "application " | TOPICAL_CREAM | Freq: Two times a day (BID) | CUTANEOUS | 0 refills | Status: DC

## 2017-03-21 NOTE — Patient Instructions (Signed)
Andrea Morton, you were seen today for a bug bite and some irritation of your skin.  This will likely go away on it's own, but I prescribed you a steroid cream to help bring down some of the inflammation and atarax to help with the itching.   If you develop any worsening pain, fevers, chills or drainage from the area please come back to be seen.  Otherwise, follow up as needed.  Take care, Gregg Holster L. Myrtie SomanWarden, MD [facility] Resident PGY-2 03/21/2017 5:02 PM

## 2017-03-21 NOTE — Assessment & Plan Note (Signed)
Patient has 7x7cm urticarial wheal on posterior L thigh that will likely be self-limiting. No systemic signs/symptoms or red flags. Unlikely to be skin infection.  -triamcinolone 0.1% -atarax 10 TID PRN itching - return precautions discussed.

## 2017-03-21 NOTE — Progress Notes (Signed)
    Subjective:  Andrea Morton is a 26 y.o. female who [facility] with a bug bite on her leg  HPI:  Bug bite Noticed yesterday that she a somewhat painful and irritated area on the back of her left leg with some wheals in a tracking pattern.  She woke up this morning and the area was enlarged, warm, red and more uncomfortable. Denies, SOB, CP, chills or drainage from the area.  She has no known allergies. Denies any recent changes in detergents or recent soaps. No pets at home and no recent travel.  Has not stayed at any hotels recently. Thinks she might have been bit by a spider.     PMH: HTN Tobacco use: current smoker Medication: reviewed and updated ROS: see HPI   Objective:  Physical Exam: BP 118/68   Pulse 86   Temp 98.9 F (37.2 C) (Oral)   Ht 5\' 11"  (1.803 m)   Wt 273 lb 12.8 oz (124.2 kg)   SpO2 98%   BMI 38.19 kg/m   Gen: 26yo F in NAD, resting comfortably Skin: 7x7cm erythematous wheal without drainage on posterior thigh on the left side. Minimally TTP along with some warmth to touch (see image below) Neuro: grossly normal, moves all extremities Psych: Normal affect and thought content  No results found for this or any previous visit (from the past 72 hour(s)).      Assessment/Plan:  Bug bite Patient has 7x7cm urticarial wheal on posterior L thigh that will likely be self-limiting. No systemic signs/symptoms or red flags. Unlikely to be skin infection.  -triamcinolone 0.1% -atarax 10 TID PRN itching - return precautions discussed.   Yuepheng Schaller L. Myrtie SomanWarden, MD [facility] Resident PGY-2 03/21/2017 6:58 PM

## 2017-05-17 ENCOUNTER — Other Ambulatory Visit: Payer: Self-pay

## 2017-05-17 ENCOUNTER — Emergency Department (HOSPITAL_BASED_OUTPATIENT_CLINIC_OR_DEPARTMENT_OTHER)
Admission: EM | Admit: 2017-05-17 | Discharge: 2017-05-17 | Disposition: A | Discharge status: Home / Self Care | Payer: Medicaid Other | Attending: Emergency Medicine | Admitting: Emergency Medicine

## 2017-05-17 ENCOUNTER — Encounter (HOSPITAL_BASED_OUTPATIENT_CLINIC_OR_DEPARTMENT_OTHER): Payer: Self-pay | Admitting: Emergency Medicine

## 2017-05-17 DIAGNOSIS — F17210 Nicotine dependence, cigarettes, uncomplicated: Secondary | ICD-10-CM | POA: Insufficient documentation

## 2017-05-17 DIAGNOSIS — K0889 Other specified disorders of teeth and supporting structures: Secondary | ICD-10-CM | POA: Insufficient documentation

## 2017-05-17 DIAGNOSIS — I10 Essential (primary) hypertension: Secondary | ICD-10-CM | POA: Insufficient documentation

## 2017-05-17 MED ORDER — PENICILLIN V POTASSIUM 500 MG PO TABS: 500.0000 mg | ORAL_TABLET | Freq: Four times a day (QID) | ORAL | 0 refills | Status: AC

## 2017-05-17 MED ORDER — IBUPROFEN 800 MG PO TABS: 800.0000 mg | ORAL_TABLET | Freq: Three times a day (TID) | ORAL | 0 refills | Status: DC

## 2017-05-17 MED ORDER — LIDOCAINE VISCOUS 2 % MT SOLN: 20.0000 mL | OROMUCOSAL | 0 refills | Status: AC | PRN

## 2017-05-17 NOTE — ED Triage Notes (Signed)
Right upper dental pain from wisdom tooth x 3 days.

## 2017-05-17 NOTE — Discharge Instructions (Signed)
Take antibiotics as prescribed. Take the entire course of antibiotics, even if your symptoms improve.  Take ibuprofen 3 times a day with meals.  Do not take other anti-inflammatories at the same time open (Advil, Motrin, naproxen, Aleve). You may supplement with Tylenol if you need further pain control. Use ice packs to help control your pain and swelling. Use viscous lidocaine for pain control.  Follow-up with a dentist.  Contact the dentist you already have, or there is information about other dentists in the area. Return to the emergency room if you develop fevers, vomiting, worsening pain, pain with movement of your eyes, or any new or concerning symptoms.

## 2017-05-17 NOTE — ED Provider Notes (Signed)
[facility] EMERGENCY DEPARTMENT Provider Note   CSN: [id number] Arrival date & time: 05/17/17  4098     History   Chief Complaint Chief Complaint  Patient presents with  . Dental Pain    HPI Andrea Morton is a 26 y.o. female presenting with 3-day history of tooth pain.  Patient states that for the past 3 days, she has had right upper tooth pain.  She states she has broken this tooth in the past.  She has never seen a dentist.  She contacted a dentist, who told her to come the emergency room to get antibiotics and to follow-up in 1 week for the tooth to be pulled.  She reports pain of the right upper tooth and cheek.  It is constant, nothing makes it better or worse.  She denies fevers, chills, vision changes, eye pain, nausea, vomiting.  She is not immunocompromised.  No history of diabetes.  She took 1 dose of ibuprofen 800 and some of her mom's antibiotics without improvement of pain.   HPI  Past Medical History:  Diagnosis Date  . H/O varicella   . Herpes, genital   . Hypertension   . Obesity   . Pancreatitis   . Preeclampsia, severe 2012   lead to C section.  . Pregnancy induced hypertension   . Trichomonas   . Yeast infection     Patient Active Problem List   Diagnosis Date Noted  . Bug bite 03/21/2017  . Nexplanon in place 09/13/2015  . Healthcare maintenance 09/13/2015  . Well woman exam with routine gynecological exam 05/05/2012  . Skin lesion of left arm 08/28/2011  . ASCUS on Pap smear 11/07/2010  . GENITAL HERPES 06/19/2010  . TOBACCO USER 02/19/2009  . HTN (hypertension), benign 01/28/2008  . OBESITY, CLASS III 08/01/2006    Past Surgical History:  Procedure Laterality Date  . CESAREAN SECTION  05/03/2011   Procedure: CESAREAN SECTION;  Surgeon: Janine Limbo, MD;  Location: [facility] ORS;  Service: Gynecology;  Laterality: N/A;  . CHOLECYSTECTOMY  07/05/2011   Procedure: LAPAROSCOPIC CHOLECYSTECTOMY WITH INTRAOPERATIVE CHOLANGIOGRAM;   Surgeon: Ernestene Mention, MD;  Location: [facility] OR;  Service: General;  Laterality: N/A;  . ERCP  07/09/2011   Procedure: ENDOSCOPIC RETROGRADE CHOLANGIOPANCREATOGRAPHY (ERCP);  Surgeon: Louis Meckel, MD;  Location: [facility] OR;  Service: Endoscopy;  Laterality: N/A;    OB History    Gravida Para Term Preterm AB Living   1 1 0 1 0 1   SAB TAB Ectopic Multiple Live Births   0 0 0 0 1       Home Medications    Prior to Admission medications   Medication Sig Start Date End Date Taking? Authorizing Provider  acyclovir (ZOVIRAX) 400 MG tablet Take 1 tablet (400 mg total) by mouth daily as needed. For outbreak 02/21/17   Latrelle Dodrill, MD  etonogestrel (NEXPLANON) 68 MG IMPL implant Inject 1 each (68 mg total) into the skin once. 09/03/12   Ardyth Gal, MD  ibuprofen (ADVIL,MOTRIN) 800 MG tablet Take 1 tablet (800 mg total) by mouth 3 (three) times daily with meals. 05/17/17   Murle Hellstrom, PA-C  lidocaine (XYLOCAINE) 2 % solution Use as directed 20 mLs in the mouth or throat as needed for mouth pain. 05/17/17   Marivel Mcclarty, PA-C  penicillin v potassium (VEETID) 500 MG tablet Take 1 tablet (500 mg total) by mouth 4 (four) times daily for 7 days. 05/17/17 05/24/17  Makylah Bossard, Jeanette Caprice, PA-C  Family History Family History  Problem Relation Age of Onset  . Diabetes Mother   . Obesity Mother   . Asthma Sister   . Obesity Sister   . Diabetes Maternal Aunt   . Obesity Maternal Aunt   . Diabetes Maternal Grandmother   . Anesthesia problems Neg Hx     Social History Social History   Tobacco Use  . Smoking status: Current Every Day Smoker    Years: 2.00    Types: Cigarettes    Last attempt to quit: 10/13/2010    Years since quitting: 6.5  . Smokeless tobacco: Never Used  Substance Use Topics  . Alcohol use: No  . Drug use: No     Allergies   Patient has no known allergies.   Review of Systems Review of Systems  Constitutional: Negative for chills and fever.    HENT: Positive for dental problem and facial swelling.   Eyes: Negative for pain.     Physical Exam Updated Vital Signs BP 135/66 (BP Location: Right Arm)   Pulse 82   Temp 98.1 F (36.7 C) (Oral)   Resp 18   Ht 5\' 10"  (1.778 m)   Wt 120.2 kg (265 lb)   SpO2 100%   BMI 38.02 kg/m   Physical Exam  Constitutional: She is oriented to person, place, and time. She appears well-developed and well-nourished. No distress.  HENT:  Head: Normocephalic and atraumatic.  Mouth/Throat: Uvula is midline, oropharynx is clear and moist and mucous membranes are normal. No oral lesions. No trismus in the jaw. Abnormal dentition. Dental caries present. No dental abscesses or uvula swelling.    Gum erythema and edema.  No obvious abscess.  No trismus.  Patient handling secretions well.  Uvula midline with equal palate rise.  Eyes: EOM are normal.  Neck: Normal range of motion.  Cardiovascular: Normal rate, regular rhythm and intact distal pulses.  Pulmonary/Chest: Effort normal and breath sounds normal. No respiratory distress. She has no wheezes.  Abdominal: She exhibits no distension.  Musculoskeletal: Normal range of motion.  Neurological: She is alert and oriented to person, place, and time.  Skin: Skin is warm. No rash noted.  Psychiatric: She has a normal mood and affect.  Nursing note and vitals reviewed.    ED Treatments / Results  Labs (all labs ordered are listed, but only abnormal results are displayed) Labs Reviewed - No data to display  EKG  EKG Interpretation None       Radiology No results found.  Procedures Procedures (including critical care time)  Medications Ordered in ED Medications - No data to display   Initial Impression / Assessment and Plan / ED Course  I have reviewed the triage vital signs and the nursing notes.  Pertinent labs & imaging results that were available during my care of the patient were reviewed by me and considered in my medical  decision making (see chart for details).     Patient presenting with right upper tooth pain.  Physical exam reassuring, she is afebrile, not tachycardic.  She appears nontoxic.  She has gum erythema and edema.  No obvious abscess.  No sign of Ludwig's or spreading abscess.  Will treat with penicillin, ibuprofen, viscous lidocaine.  Patient to follow-up with dentist.  At this time, patient appears safe for discharge.  Return precautions given.  Patient states she understands and agrees to plan.   Final Clinical Impressions(s) / ED Diagnoses   Final diagnoses:  Pain, dental  ED Discharge Orders        Ordered    ibuprofen (ADVIL,MOTRIN) 800 MG tablet  3 times daily with meals     05/17/17 1005    lidocaine (XYLOCAINE) 2 % solution  As needed     05/17/17 1005    penicillin v potassium (VEETID) 500 MG tablet  4 times daily     05/17/17 1005       Riversideaccavale, McCluskySophia, PA-C 05/17/17 1719    Linwood DibblesKnapp, Jon, MD 05/18/17 [phone]

## 2017-05-17 NOTE — ED Notes (Signed)
Dental Box at bedside. 

## 2017-09-06 ENCOUNTER — Emergency Department (HOSPITAL_BASED_OUTPATIENT_CLINIC_OR_DEPARTMENT_OTHER)
Admission: EM | Admit: 2017-09-06 | Discharge: 2017-09-07 | Disposition: A | Discharge status: Home / Self Care | Payer: No Typology Code available for payment source | Attending: Emergency Medicine | Admitting: Emergency Medicine

## 2017-09-06 ENCOUNTER — Emergency Department (HOSPITAL_BASED_OUTPATIENT_CLINIC_OR_DEPARTMENT_OTHER): Payer: No Typology Code available for payment source

## 2017-09-06 ENCOUNTER — Encounter (HOSPITAL_BASED_OUTPATIENT_CLINIC_OR_DEPARTMENT_OTHER): Payer: Self-pay | Admitting: Emergency Medicine

## 2017-09-06 ENCOUNTER — Other Ambulatory Visit: Payer: Self-pay

## 2017-09-06 DIAGNOSIS — M79662 Pain in left lower leg: Secondary | ICD-10-CM | POA: Insufficient documentation

## 2017-09-06 DIAGNOSIS — Y999 Unspecified external cause status: Secondary | ICD-10-CM | POA: Diagnosis not present

## 2017-09-06 DIAGNOSIS — M25512 Pain in left shoulder: Secondary | ICD-10-CM | POA: Insufficient documentation

## 2017-09-06 DIAGNOSIS — I10 Essential (primary) hypertension: Secondary | ICD-10-CM | POA: Diagnosis not present

## 2017-09-06 DIAGNOSIS — Z79899 Other long term (current) drug therapy: Secondary | ICD-10-CM | POA: Insufficient documentation

## 2017-09-06 DIAGNOSIS — Y9389 Activity, other specified: Secondary | ICD-10-CM | POA: Insufficient documentation

## 2017-09-06 DIAGNOSIS — Y92410 Unspecified street and highway as the place of occurrence of the external cause: Secondary | ICD-10-CM | POA: Insufficient documentation

## 2017-09-06 DIAGNOSIS — M79602 Pain in left arm: Secondary | ICD-10-CM | POA: Insufficient documentation

## 2017-09-06 DIAGNOSIS — F17210 Nicotine dependence, cigarettes, uncomplicated: Secondary | ICD-10-CM | POA: Insufficient documentation

## 2017-09-06 DIAGNOSIS — M7918 Myalgia, other site: Secondary | ICD-10-CM

## 2017-09-06 LAB — PREGNANCY, URINE: Preg Test, Ur: NEGATIVE

## 2017-09-06 MED ORDER — IBUPROFEN 400 MG PO TABS: 400.0000 mg | ORAL_TABLET | Freq: Four times a day (QID) | ORAL | 0 refills | Status: DC | PRN

## 2017-09-06 MED ORDER — METHOCARBAMOL 500 MG PO TABS
1000.0000 mg | ORAL_TABLET | Freq: Once | ORAL | Status: AC
Start: 2017-09-06 — End: 2017-09-06
  Administered 2017-09-06: 1000 mg via ORAL
  Filled 2017-09-06: qty 2

## 2017-09-06 MED ORDER — METHOCARBAMOL 500 MG PO TABS: 500.0000 mg | ORAL_TABLET | Freq: Two times a day (BID) | ORAL | 0 refills | Status: AC

## 2017-09-06 MED ORDER — IBUPROFEN 800 MG PO TABS
800.0000 mg | ORAL_TABLET | Freq: Once | ORAL | Status: AC
Start: 2017-09-06 — End: 2017-09-06
  Administered 2017-09-06: 800 mg via ORAL
  Filled 2017-09-06: qty 1

## 2017-09-06 MED ORDER — ACETAMINOPHEN 325 MG PO TABS: 650.0000 mg | ORAL_TABLET | Freq: Four times a day (QID) | ORAL | 0 refills | Status: AC | PRN

## 2017-09-06 NOTE — ED Notes (Signed)
Patient states that she is sitting in fast track and sees everyone walking by and she had not been seen. The patient states that she wants her "discharge paperwork" - since she has not been seen the patient was advised that there is no paperwork at this time.
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
## 2017-09-06 NOTE — Discharge Instructions (Signed)

## 2017-09-09 ENCOUNTER — Other Ambulatory Visit: Payer: Self-pay

## 2017-09-09 ENCOUNTER — Ambulatory Visit: Payer: Self-pay | Admitting: Internal Medicine

## 2017-09-09 VITALS — BP 130/78 | HR 74 | Temp 99.8°F | Ht 71.0 in | Wt 286.4 lb

## 2017-09-09 DIAGNOSIS — M25512 Pain in left shoulder: Secondary | ICD-10-CM

## 2017-09-09 NOTE — Patient Instructions (Signed)
Please do range of motion exercises to keep your shoulder health. You can continue Ibuprofen as needed for pain. Please follow up in two weeks if no improvement in pain   Shoulder Range of Motion Exercises Shoulder range of motion (ROM) exercises are designed to keep the shoulder moving freely. They are often recommended for people who have shoulder pain. Phase 1 exercises When you are able, do this exercise 5-6 days per week, or as told by your health care provider. Work toward doing 2 sets of 10 swings. Pendulum Exercise How To Do This Exercise Lying Down 1. Lie face-down on a bed with your abdomen close to the side of the bed. 2. Let your arm hang over the side of the bed. 3. Relax your shoulder, arm, and hand. 4. Slowly and gently swing your arm forward and back. Do not use your neck muscles to swing your arm. They should be relaxed. If you are struggling to swing your arm, have someone gently swing it for you. When you do this exercise for the first time, swing your arm at a 15 degree angle for 15 seconds, or swing your arm 10 times. As pain lessens over time, increase the angle of the swing to 30-45 degrees. 5. Repeat steps 1-4 with the other arm.  How To Do This Exercise While Standing 1. Stand next to a sturdy chair or table and hold on to it with your hand. 1. Bend forward at the waist. 2. Bend your knees slightly. 3. Relax your other arm and let it hang limp. 4. Relax the shoulder blade of the arm that is hanging and let it drop. 5. While keeping your shoulder relaxed, use body motion to swing your arm in small circles. The first time you do this exercise, swing your arm for about 30 seconds or 10 times. When you do it next time, swing your arm for a little longer. 6. Stand up tall and relax. 7. Repeat steps 1-7, this time changing the direction of the circles. 2. Repeat steps 1-8 with the other arm.  Phase 2 exercises Do these exercises 3-4 times per day on 5-6 days per week or  as told by your health care provider. Work toward holding the stretch for 20 seconds. Stretching Exercise 1 1. Lift your arm straight out in front of you. 2. Bend your arm 90 degrees at the elbow (right angle) so your forearm goes across your body and looks like the letter "L." 3. Use your other arm to gently pull the elbow forward and across your body. 4. Repeat steps 1-3 with the other arm. Stretching Exercise 2 You will need a towel or rope for this exercise. 1. Bend one arm behind your back with the palm facing outward. 2. Hold a towel with your other hand. 3. Reach the arm that holds the towel above your head, and bend that arm at the elbow. Your wrist should be behind your neck. 4. Use your free hand to grab the free end of the towel. 5. With the higher hand, gently pull the towel up behind you. 6. With the lower hand, pull the towel down behind you. 7. Repeat steps 1-6 with the other arm.  Phase 3 exercises Do each of these exercises at four different times of day (sessions) every day or as told by your health care provider. To begin with, repeat each exercise 5 times (repetitions). Work toward doing 3 sets of 12 repetitions or as told by your health care provider.  Strengthening Exercise 1 You will need a light weight for this activity. As you grow stronger, you may use a heavier weight. 1. Standing with a weight in your hand, lift your arm straight out to the side until it is at the same height as your shoulder. 2. Bend your arm at 90 degrees so that your fingers are pointing to the ceiling. 3. Slowly raise your hand until your arm is straight up in the air. 4. Repeat steps 1-3 with the other arm.  Strengthening Exercise 2 You will need a light weight for this activity. As you grow stronger, you may use a heavier weight. 1. Standing with a weight in your hand, gradually move your straight arm in an arc, starting at your side, then out in front of you, then straight up over your  head. 2. Gradually move your other arm in an arc, starting at your side, then out in front of you, then straight up over your head. 3. Repeat steps 1-2 with the other arm.  Strengthening Exercise 3 You will need an elastic band for this activity. As you grow stronger, gradually increase the size of the bands or increase the number of bands that you use at one time. 1. While standing, hold an elastic band in one hand and raise that arm up in the air. 2. With your other hand, pull down the band until that hand is by your side. 3. Repeat steps 1-2 with the other arm.  This information is not intended to replace advice given to you by your health care provider. Make sure you discuss any questions you have with your health care provider. Document Released: 02/17/2003 Document Revised: 01/15/2016 Document Reviewed: 05/17/2014 Elsevier Interactive Patient Education  [employer].
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
## 2017-09-11 ENCOUNTER — Encounter: Payer: Self-pay | Admitting: Internal Medicine

## 2017-09-11 DIAGNOSIS — M25512 Pain in left shoulder: Secondary | ICD-10-CM | POA: Insufficient documentation

## 2017-09-11 DIAGNOSIS — M79602 Pain in left arm: Secondary | ICD-10-CM | POA: Insufficient documentation
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
## 2018-08-20 ENCOUNTER — Telehealth: Payer: Self-pay | Admitting: *Deleted

## 2018-08-20 ENCOUNTER — Ambulatory Visit: Payer: Medicaid Other | Admitting: Family Medicine

## 2018-08-20 NOTE — Telephone Encounter (Signed)
Spoke with patient this morning regarding her appointment today.  She voiced understanding on why we needed to cancel due to trying to decrease patient exposure.  She did ask that provider call her regarding some abdominal pain that she has been having.  She has had bariatric surgery in the past and has tried to reach out to her surgeon but he is out of state and no longer practicing.  She states that she has times when she is not able to keep anything down.  When provider tried to call patient back she didn't answer.  Message left for her to return our call.  Zephyr Ridley,CMA

## 2018-12-02 ENCOUNTER — Other Ambulatory Visit: Payer: Self-pay | Admitting: *Deleted

## 2018-12-02 DIAGNOSIS — Z20822 Contact with and (suspected) exposure to covid-19: Secondary | ICD-10-CM

## 2018-12-04 NOTE — Addendum Note (Signed)
Addended by: Brigitte Pulse on: 12/04/2018 03:04 PM   Modules accepted: Orders

## 2019-01-02 ENCOUNTER — Other Ambulatory Visit: Payer: Self-pay

## 2019-01-02 ENCOUNTER — Other Ambulatory Visit (HOSPITAL_COMMUNITY)
Admission: RE | Admit: 2019-01-02 | Discharge: 2019-01-02 | Disposition: A | Discharge status: Home / Self Care | Payer: Medicaid Other | Source: Ambulatory Visit | Attending: Family Medicine | Admitting: Family Medicine

## 2019-01-02 ENCOUNTER — Other Ambulatory Visit: Payer: Self-pay | Admitting: Family Medicine

## 2019-01-02 ENCOUNTER — Ambulatory Visit (INDEPENDENT_AMBULATORY_CARE_PROVIDER_SITE_OTHER): Payer: Self-pay | Admitting: Family Medicine

## 2019-01-02 VITALS — BP 130/70 | HR 79

## 2019-01-02 DIAGNOSIS — Z202 Contact with and (suspected) exposure to infections with a predominantly sexual mode of transmission: Secondary | ICD-10-CM | POA: Diagnosis not present

## 2019-01-02 DIAGNOSIS — E038 Other specified hypothyroidism: Secondary | ICD-10-CM

## 2019-01-02 DIAGNOSIS — B009 Herpesviral infection, unspecified: Secondary | ICD-10-CM

## 2019-01-02 DIAGNOSIS — A6000 Herpesviral infection of urogenital system, unspecified: Secondary | ICD-10-CM

## 2019-01-02 DIAGNOSIS — N939 Abnormal uterine and vaginal bleeding, unspecified: Secondary | ICD-10-CM

## 2019-01-02 DIAGNOSIS — Z975 Presence of (intrauterine) contraceptive device: Secondary | ICD-10-CM

## 2019-01-02 LAB — POCT WET PREP (WET MOUNT)
Clue Cells Wet Prep Whiff POC: NEGATIVE
Trichomonas Wet Prep HPF POC: ABSENT

## 2019-01-02 LAB — POCT HEMOGLOBIN: Hemoglobin: 12.1 g/dL (ref 11–14.6)

## 2019-01-02 LAB — POCT URINE PREGNANCY: Preg Test, Ur: NEGATIVE

## 2019-01-02 MED ORDER — MEDROXYPROGESTERONE ACETATE 10 MG PO TABS: 10.0000 mg | ORAL_TABLET | Freq: Every day | ORAL | 0 refills | Status: AC

## 2019-01-02 MED ORDER — VALACYCLOVIR HCL 1 G PO TABS: 1000.0000 mg | ORAL_TABLET | Freq: Every day | ORAL | 2 refills | Status: AC

## 2019-01-02 NOTE — Assessment & Plan Note (Signed)
Nexplanon placed 09/2015, due for removal. Patient has scheduled appointment for removal and re-insertion of Nexplanon.

## 2019-01-02 NOTE — Patient Instructions (Signed)
Abnormal Uterine Bleeding °Abnormal uterine bleeding means bleeding more than usual from your uterus. It can include: °· Bleeding between periods. °· Bleeding after sex. °· Bleeding that is heavier than normal. °· Periods that last longer than usual. °· Bleeding after you have stopped having your period (menopause). °There are many problems that may cause this. You should see a doctor for any kind of bleeding that is not normal. Treatment depends on the cause of the bleeding. °Follow these instructions at home: °· Watch your condition for any changes. °· Do not use tampons, douche, or have sex, if your doctor tells you not to. °· Change your pads often. °· Get regular well-woman exams. Make sure they include a pelvic exam and cervical cancer screening. °· Keep all follow-up visits as told by your doctor. This is important. °Contact a doctor if: °· The bleeding lasts more than one week. °· You feel dizzy at times. °· You feel like you are going to throw up (nauseous). °· You throw up. °Get help right away if: °· You pass out. °· You have to change pads every hour. °· You have belly (abdominal) pain. °· You have a fever. °· You get sweaty. °· You get weak. °· You passing large blood clots from your vagina. °Summary °· Abnormal uterine bleeding means bleeding more than usual from your uterus. °· There are many problems that may cause this. You should see a doctor for any kind of bleeding that is not normal. °· Treatment depends on the cause of the bleeding. °This information is not intended to replace advice given to you by your health care provider. Make sure you discuss any questions you have with your health care provider. °Document Released: 03/18/2009 Document Revised: 05/15/2016 Document Reviewed: 05/15/2016 °Elsevier Patient Education © 2020 Elsevier Inc. ° °

## 2019-01-02 NOTE — Assessment & Plan Note (Signed)
Unclear etiology at this time. Can consider related to Nexplanon as it has been due for removal. U preg negative ruling out pregnancy as cause. Will obtain TSH to ensure no thyroid dysfunction, especially given recent weight gain. Will obtain Hgb to ensure that patient is not anemic due to increased blood loss. Will give RX for provera 10 mg daily x10 days and informed patient to follow up in 1-2 weeks to ensure resolution of bleeding. Strict return precautions given.

## 2019-01-02 NOTE — Assessment & Plan Note (Signed)
RX for valtrex given. Strict return precautions given. Follow up if no improvement or worsening symptoms.

## 2019-01-02 NOTE — Progress Notes (Signed)
   Subjective:    Patient ID: Andrea Morton, female    DOB: 24-Apr-1991, 28 y.o.   MRN: [id number]   CC: HSV outbreak, 3 week period, contraception   HPI: HSV outbreak Patient reports she needs a refill on her valtrex as she is now having an outbreak. Usually gets outbreaks around the time of her period. Has not looked to see if she has lesions but has significant pain which feels similar to her previous outbreaks.   Prolonged period  Reports period lasting 3 weeks. Currently bleeding. Has to use 3 tampons/day. Reports some days her bleeding is light while other days can have clots. Denies use of blood thinners. Denies NSAID use. This has never happened to her in the past  Contraception  Patient states she has had a Nexplanon for 3 years. States that she needs it out. Would like to have Nexplanon placed again as she feels this method has worked for her. Is currently sexually active. Has gained some weight but is planning on having a gastric sleeve revision soon.   Objective:  BP 130/70   Pulse 79   SpO2 99%  Vitals and nursing note reviewed  General: well nourished, in no acute distress HEENT: normocephalic,  Neck: supple  Cardiac: Regular rate  Respiratory: no increased work of breathing Extremities: no edema or cyanosis. Warm, well perfused.   bilaterally Skin: warm and dry, no rashes noted Neuro: alert and oriented, no focal deficits Female genitalia: normal external genitalia, vulva, vagina, cervix, uterus and adnexa. Small vesicles noted on posterior aspect of labia   Assessment & Plan:    Genital herpes RX for valtrex given. Strict return precautions given. Follow up if no improvement or worsening symptoms.   Abnormal uterine bleeding (AUB) Unclear etiology at this time. Can consider related to Nexplanon as it has been due for removal. U preg negative ruling out pregnancy as cause. Will obtain TSH to ensure no thyroid dysfunction, especially given recent weight gain.  Will obtain Hgb to ensure that patient is not anemic due to increased blood loss. Will give RX for provera 10 mg daily x10 days and informed patient to follow up in 1-2 weeks to ensure resolution of bleeding. Strict return precautions given.   Nexplanon in place Nexplanon placed 09/2015, due for removal. Patient has scheduled appointment for removal and re-insertion of Nexplanon.   Discussed with Dr. Erin Hearing   Return in about 2 weeks (around 01/16/2019).   Caroline More, DO, PGY-3

## 2019-01-03 LAB — RPR: RPR Ser Ql: NONREACTIVE

## 2019-01-03 LAB — HEPATITIS C ANTIBODY: Hep C Virus Ab: <0.1 s/co ratio (ref 0.0–0.9)

## 2019-01-03 LAB — HIV ANTIBODY (ROUTINE TESTING W REFLEX): HIV Screen 4th Generation wRfx: NONREACTIVE

## 2019-01-03 LAB — TSH+FREE T4
Free T4: 0.9 ng/dL (ref 0.82–1.77)
TSH: 1.42 u[IU]/mL (ref 0.450–4.500)

## 2019-01-05 ENCOUNTER — Telehealth: Payer: Self-pay

## 2019-01-05 NOTE — Telephone Encounter (Signed)
Generic voice mail and no answer.  LVM for patient to call officeto get test results.   Ozella Almond, [place]

## 2019-01-05 NOTE — Telephone Encounter (Signed)
LVM for patient to call office.  .Gerlean Cid R Maly Lemarr, CMA  

## 2019-01-05 NOTE — Progress Notes (Deleted)
   Subjective:    Patient ID: Andrea Morton, female    DOB: 1991/01/06, 28 y.o.   MRN: [id number]   CC:  HPI: GYNECOLOGY CLINIC PROCEDURE NOTE  Andrea Morton is a 28 y.o. G1P0101 here for Nexplanon removal*** Nexplanon insertion. No GYN concerns.  Last pap smear was on *** and was normal.  No other gynecologic concerns.  Nexplanon Removal and Insertion  Patient was given informed consent for removal of her Implanon and insertion of Nexplanon.  Patient does understand that irregular bleeding is a very common side effect of this medication. She was advised to have backup contraception for one week after replacement of the implant. Pregnancy test in clinic today was negative.  Appropriate time out taken. Implanon site identified. Area prepped in usual sterile fashon. One ml of 1% lidocaine was used to anesthetize the area at the distal end of the implant. A small stab incision was made right beside the implant on the distal portion. The Nexplanon rod was grasped using hemostats and removed without difficulty. There was minimal blood loss. There were no complications. Area was then injected with 3 ml of 1 % lidocaine. She was re-prepped with betadine, Nexplanon removed from packaging, Device confirmed in needle, then inserted full length of needle and withdrawn per handbook instructions. Nexplanon was able to palpated in the patient's arm; patient palpated the insert herself.  There was minimal blood loss. Patient insertion site covered with guaze and a pressure bandage to reduce any bruising. The patient tolerated the procedure well and was given post procedure instructions.    AUB Seen by me on 7/31.  At that time it was felt that abnormal uterine bleeding was secondary to Nexplanon needing removal and reinsertion.  Was given Rx for Provera 10 mg daily to be used for 10 days to stop current bleeding.  Urine pregnancy at that time was negative.  TSH was also within normal limits.  Hemoglobin  was within normal limits.    Smoking status reviewed  Review of Systems   Objective:  There were no vitals taken for this visit. Vitals and nursing note reviewed  General: well nourished, in no acute distress HEENT: normocephalic, TM's visualized bilaterally, no scleral icterus or conjunctival pallor, no nasal discharge, moist mucous membranes, good dentition without erythema or discharge noted in posterior oropharynx Neck: supple, non-tender, without lymphadenopathy Cardiac: RRR, clear S1 and S2, no murmurs, rubs, or gallops Respiratory: clear to auscultation bilaterally, no increased work of breathing Abdomen: soft, nontender, nondistended, no masses or organomegaly. Bowel sounds present Extremities: no edema or cyanosis. Warm, well perfused. 2+ radial and PT pulses bilaterally Skin: warm and dry, no rashes noted Neuro: alert and oriented, no focal deficits   Assessment & Plan:    No problem-specific Assessment & Plan notes found for this encounter.    No follow-ups on file.   Caroline More, DO, PGY-3

## 2019-01-05 NOTE — Telephone Encounter (Signed)
Pt informed.  F/u made for Thursday  Christen Bame, CMA

## 2019-01-06 ENCOUNTER — Ambulatory Visit: Payer: Medicaid Other | Admitting: Family Medicine

## 2019-01-06 ENCOUNTER — Telehealth: Payer: Self-pay | Admitting: Family Medicine

## 2019-01-06 DIAGNOSIS — A749 Chlamydial infection, unspecified: Secondary | ICD-10-CM

## 2019-01-06 LAB — CERVICOVAGINAL ANCILLARY ONLY
Chlamydia: POSITIVE — AB
Neisseria Gonorrhea: NEGATIVE

## 2019-01-06 MED ORDER — AZITHROMYCIN 500 MG PO TABS: 1000.0000 mg | ORAL_TABLET | Freq: Once | ORAL | Status: DC

## 2019-01-06 MED ORDER — CEFTRIAXONE SODIUM 250 MG IJ SOLR
250.0000 mg | Freq: Once | INTRAMUSCULAR | Status: DC
Start: 2019-01-06 — End: 2019-01-08

## 2019-01-06 NOTE — Telephone Encounter (Signed)
Called patient x3 to inform her of positive Chlamydia test. Patient will need to be treated ASAP and have partners treated. VM left to call clinic so we can explain this to her.   Please call patient and schedule her for RN visit for treatment.   I will place orders for treatment to be given.   Dalphine Handing, PGY-3 [facility] 01/06/2019 5:48 PM

## 2019-01-07 ENCOUNTER — Telehealth: Payer: Self-pay | Admitting: *Deleted

## 2019-01-07 NOTE — Telephone Encounter (Signed)
Pt informed. She has an appt with you tomorrow wants to wait and get treated then. Deseree Kennon Holter, CMA

## 2019-01-07 NOTE — Telephone Encounter (Signed)
-----   Message from Caroline More, DO sent at 01/06/2019  5:50 PM EDT ----- I have attempted to call patient x3, no answer. Please call patient and inform her of result and need to be seen in RN clinic. I have placed orders in previous telephone note to have treatment. All partners need treatment as well.

## 2019-01-07 NOTE — Progress Notes (Signed)
   Subjective:    Patient ID: Andrea Morton, female    DOB: 05-02-1991, 28 y.o.   MRN: [id number]   CC: Nexplanon questions, AUB, chlamydia   HPI: Nexplanon removal/re-insertion Andrea Morton is a 28 y.o. G1P0101 here for Nexplanon removal and re-insertion. No GYN concerns.  Last pap smear was on  11/2015 and was normal.  No other gynecologic concerns. Nexplanon placed 09/2015. Does desire permanent contraception with tubal ligation in the future. Is refusing dep-provera at this time.   AUB Seen by me on 7/31.  At that time it was felt that abnormal uterine bleeding was secondary to Nexplanon needing removal and reinsertion.  Was given Rx for Provera 10 mg daily to be used for 10 days to stop current bleeding.  Urine pregnancy at that time was negative.  TSH was also within normal limits.  Hemoglobin was within normal limits.    Orts to me that bleeding has stopped.  Chlamydia Patient with positive Chlamydia test on 7/31.  Was informed of this over the telephone and came in today for treatment.  Has already inform partner.  Objective:  BP 130/80   Pulse 84   SpO2 100%  Vitals and nursing note reviewed  General: well nourished, in no acute distress HEENT: normocephalic Cardiac: RRR, clear S1 and S2, no murmurs, rubs, or gallops Respiratory: clear to auscultation bilaterally, no increased work of breathing Abdomen: soft, nontender, nondistended, no masses or organomegaly. Bowel sounds present Extremities: no edema or cyanosis. Warm, well perfused. Nexplanon palpated in LUE Skin: warm and dry, no rashes noted Neuro: alert and oriented, no focal deficits  Assessment & Plan:    Nexplanon in place Patient at first did not know whether she wanted reinsertion.  Is considering tubal ligation.  Discussed the possibility of getting a Depo-Provera shot in the interim and then patient got tubal ligation.  Patient is unsure about this.  After a lengthy discussion has decided that she  wants removal and reinsertion.  Pregnancy test today negative.  Have scheduled patient for removal and reinsertion on 8/11.  Have also placed referral to gynecology as patient would like to discuss tubal ligation further.   Abnormal uterine bleeding (AUB) Resolved with use of Provera.  Advised to finish this course and then hopefully with removal of Nexplanon this will completely resolve.  Strict return precautions given.  Chlamydia Patient with positive chlamydia.  Will treat with both azithromycin and ceftriaxone today.  Advised that all partners to be treated and not to have intercourse with the and 7 days after treatment.  Patient to use condoms with every intercourse to avoid STD exposure.  Strict return precautions given.  Follow-up if no improvement.    Return in about 5 days (around 01/13/2019).   Caroline More, DO, PGY-3

## 2019-01-08 ENCOUNTER — Other Ambulatory Visit: Payer: Self-pay

## 2019-01-08 ENCOUNTER — Encounter: Payer: Self-pay | Admitting: Family Medicine

## 2019-01-08 ENCOUNTER — Ambulatory Visit (INDEPENDENT_AMBULATORY_CARE_PROVIDER_SITE_OTHER): Payer: Self-pay | Admitting: Family Medicine

## 2019-01-08 VITALS — BP 130/80 | HR 84

## 2019-01-08 DIAGNOSIS — Z30017 Encounter for initial prescription of implantable subdermal contraceptive: Secondary | ICD-10-CM

## 2019-01-08 DIAGNOSIS — Z01818 Encounter for other preprocedural examination: Secondary | ICD-10-CM

## 2019-01-08 DIAGNOSIS — A749 Chlamydial infection, unspecified: Secondary | ICD-10-CM | POA: Insufficient documentation

## 2019-01-08 DIAGNOSIS — Z975 Presence of (intrauterine) contraceptive device: Secondary | ICD-10-CM

## 2019-01-08 DIAGNOSIS — N939 Abnormal uterine and vaginal bleeding, unspecified: Secondary | ICD-10-CM

## 2019-01-08 LAB — POCT URINE PREGNANCY: Preg Test, Ur: NEGATIVE

## 2019-01-08 MED ORDER — CEFTRIAXONE SODIUM 250 MG IJ SOLR
250.0000 mg | Freq: Once | INTRAMUSCULAR | Status: AC
  Administered 2019-01-08: 250 mg via INTRAMUSCULAR

## 2019-01-08 MED ORDER — AZITHROMYCIN 500 MG PO TABS
1000.0000 mg | ORAL_TABLET | Freq: Once | ORAL | Status: AC
  Administered 2019-01-08: 1000 mg via ORAL

## 2019-01-08 NOTE — Assessment & Plan Note (Signed)
Resolved with use of Provera.  Advised to finish this course and then hopefully with removal of Nexplanon this will completely resolve.  Strict return precautions given.

## 2019-01-08 NOTE — Patient Instructions (Signed)
Chlamydia, Female  Chlamydia is a STD (sexually transmitted disease). This is an infection that spreads through sexual contact. If it is not treated, it can cause serious problems. It must be treated with antibiotic medicine. If this infection is not treated and you are pregnant or become pregnant, your baby could get it during delivery. This may cause bad health problems for the baby. Sometimes, you may not have symptoms (asymptomatic). When you have symptoms, they can include:  Burning when you pee (urinate).  Peeing often.  Fluid (discharge) coming from the vagina.  Redness, soreness, and swelling (inflammation) of the butt (rectum).  Bleeding or fluid coming from the butt.  Belly (abdominal) pain.  Pain during sex.  Bleeding between periods.  Itching, burning, or redness in the eyes.  Fluid coming from the eyes. Follow these instructions at home: Medicines  Take over-the-counter and prescription medicines only as told by your doctor.  Take your antibiotic medicine as told by your doctor. Do not stop taking the antibiotic even if you start to feel better. Sexual activity  Tell sex partners about your infection. Sex partners are people you had oral, anal, or vaginal sex with within 60 days of when you started getting sick. They need treatment, too.  Do not have sex until: ? You and your sex partners have been treated. ? Your doctor says it is okay.  If you have a single dose treatment, wait 7 days before having sex. General instructions  It is up to you to get your test results. Ask your doctor when your results will be ready.  Get a lot of rest.  Eat healthy foods.  Drink enough fluid to keep your pee (urine) clear or pale yellow.  Keep all follow-up visits as told by your doctor. You may need tests after 3 months. Preventing chlamydia  The only way to prevent chlamydia is not to have sex. To lower your risk: ? Use latex condoms correctly. Do this every time  you have sex. ? Avoid having many sex partners. ? Ask if your partner has been tested for STDs and if he or she had negative results. Contact a doctor if:  You get new symptoms.  You do not get better with treatment.  You have a fever or chills.  You have pain during sex. Get help right away if:  Your pain gets worse and does not get better with medicine.  You get flu-like symptoms, such as: ? Night sweats. ? Sore throat. ? Muscle aches.  You feel sick to your stomach (nauseous).  You throw up (vomit).  You have trouble swallowing.  You have bleeding: ? Between periods. ? After sex.  You have irregular periods.  You have belly pain that does not get better with medicine.  You have lower back pain that does not get better with medicine.  You feel weak or dizzy.  You pass out (faint).  You are pregnant and you get symptoms of chlamydia. Summary  Chlamydia is an infection that spreads through sexual contact.  Sometimes, chlamydia can cause no symptoms (asymptomatic).  Do not have sex until your doctor says it is okay.  All sex partners will have to be treated for chlamydia. This information is not intended to replace advice given to you by your health care provider. Make sure you discuss any questions you have with your health care provider. Document Released: 02/28/2008 Document Revised: 11/12/2017 Document Reviewed: 05/10/2016 Elsevier Patient Education  2020 Elsevier Inc.  

## 2019-01-08 NOTE — Assessment & Plan Note (Signed)
Patient with positive chlamydia.  Will treat with both azithromycin and ceftriaxone today.  Advised that all partners to be treated and not to have intercourse with the and 7 days after treatment.  Patient to use condoms with every intercourse to avoid STD exposure.  Strict return precautions given.  Follow-up if no improvement.

## 2019-01-08 NOTE — Assessment & Plan Note (Signed)
Patient at first did not know whether she wanted reinsertion.  Is considering tubal ligation.  Discussed the possibility of getting a Depo-Provera shot in the interim and then patient got tubal ligation.  Patient is unsure about this.  After a lengthy discussion has decided that she wants removal and reinsertion.  Pregnancy test today negative.  Have scheduled patient for removal and reinsertion on 8/11.  Have also placed referral to gynecology as patient would like to discuss tubal ligation further.

## 2019-01-12 NOTE — Progress Notes (Deleted)
   Subjective:    Patient ID: Andrea Morton, female    DOB: Sep 27, 1990, 28 y.o.   MRN: [id number]   CC:  HPI: GYNECOLOGY CLINIC PROCEDURE NOTE  Andrea Morton is a 28 y.o. G1P0101 here for Nexplanon removal*** Nexplanon insertion. No GYN concerns.  Last pap smear was on 11/2015 and was normal.  No other gynecologic concerns.  Nexplanon Removal and Insertion  Patient was given informed consent for removal of her Implanon and insertion of Nexplanon.  Patient does understand that irregular bleeding is a very common side effect of this medication. She was advised to have backup contraception for one week after replacement of the implant. Pregnancy test in clinic today was negative.  Appropriate time out taken. Implanon site identified. Area prepped in usual sterile fashon. One ml of 1% lidocaine was used to anesthetize the area at the distal end of the implant. A small stab incision was made right beside the implant on the distal portion. The Nexplanon rod was grasped using hemostats and removed without difficulty. There was minimal blood loss. There were no complications. Area was then injected with 3 ml of 1 % lidocaine. She was re-prepped with betadine, Nexplanon removed from packaging, Device confirmed in needle, then inserted full length of needle and withdrawn per handbook instructions. Nexplanon was able to palpated in the patient's arm; patient palpated the insert herself.  There was minimal blood loss. Patient insertion site covered with guaze and a pressure bandage to reduce any bruising. The patient tolerated the procedure well and was given post procedure instructions.      Smoking status reviewed  Review of Systems   Objective:  There were no vitals taken for this visit. Vitals and nursing note reviewed  General: well nourished, in no acute distress HEENT: normocephalic, TM's visualized bilaterally, no scleral icterus or conjunctival pallor, no nasal discharge, moist  mucous membranes, good dentition without erythema or discharge noted in posterior oropharynx Neck: supple, non-tender, without lymphadenopathy Cardiac: RRR, clear S1 and S2, no murmurs, rubs, or gallops Respiratory: clear to auscultation bilaterally, no increased work of breathing Abdomen: soft, nontender, nondistended, no masses or organomegaly. Bowel sounds present Extremities: no edema or cyanosis. Warm, well perfused. 2+ radial and PT pulses bilaterally Skin: warm and dry, no rashes noted Neuro: alert and oriented, no focal deficits   Assessment & Plan:    No problem-specific Assessment & Plan notes found for this encounter.    No follow-ups on file.   Andrea More, DO, PGY-3

## 2019-01-13 ENCOUNTER — Ambulatory Visit: Payer: Medicaid Other | Admitting: Family Medicine

## 2019-02-19 ENCOUNTER — Encounter (HOSPITAL_BASED_OUTPATIENT_CLINIC_OR_DEPARTMENT_OTHER): Payer: Self-pay | Admitting: *Deleted

## 2019-02-19 ENCOUNTER — Emergency Department (HOSPITAL_BASED_OUTPATIENT_CLINIC_OR_DEPARTMENT_OTHER): Payer: Medicaid Other

## 2019-02-19 ENCOUNTER — Emergency Department (HOSPITAL_BASED_OUTPATIENT_CLINIC_OR_DEPARTMENT_OTHER)
Admission: EM | Admit: 2019-02-19 | Discharge: 2019-02-19 | Disposition: A | Discharge status: Home / Self Care | Payer: Medicaid Other | Attending: Emergency Medicine | Admitting: Emergency Medicine

## 2019-02-19 ENCOUNTER — Other Ambulatory Visit: Payer: Self-pay

## 2019-02-19 DIAGNOSIS — I10 Essential (primary) hypertension: Secondary | ICD-10-CM | POA: Insufficient documentation

## 2019-02-19 DIAGNOSIS — M25512 Pain in left shoulder: Secondary | ICD-10-CM | POA: Insufficient documentation

## 2019-02-19 DIAGNOSIS — F17210 Nicotine dependence, cigarettes, uncomplicated: Secondary | ICD-10-CM | POA: Insufficient documentation

## 2019-02-19 DIAGNOSIS — Z79899 Other long term (current) drug therapy: Secondary | ICD-10-CM | POA: Insufficient documentation

## 2019-02-19 MED ORDER — IBUPROFEN 800 MG PO TABS: 800.0000 mg | ORAL_TABLET | Freq: Three times a day (TID) | ORAL | 0 refills | Status: DC

## 2019-02-19 MED ORDER — IBUPROFEN 800 MG PO TABS
800.0000 mg | ORAL_TABLET | Freq: Once | ORAL | Status: AC
  Administered 2019-02-19: 800 mg via ORAL
  Filled 2019-02-19: qty 1

## 2019-02-19 NOTE — ED Provider Notes (Signed)
[facility] EMERGENCY DEPARTMENT Provider Note   CSN: [id number] Arrival date & time: 02/19/19  1952     History   Chief Complaint Chief Complaint  Patient presents with  . Arm Pain    HPI Andrea Morton is a 28 y.o. female.     The history is provided by the patient. No language interpreter was used.  Arm Pain   Andrea Morton is a 28 y.o. female who presents to the Emergency Department complaining of arm and shoulder pain. She complains of one week of left upper arm/shoulder pain. Pain is worse with movement and abduction of the arm. 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 She recalls no injuries. She has taken Tylenol at home with no improvement in her symptoms. She denies any fevers, chest pain, shortness of breath, nausea, vomiting. Symptoms are moderate and constant nature. Past Medical History:  Diagnosis Date  . H/O varicella   . Herpes, genital   . Hypertension   . Obesity   . Pancreatitis   . Preeclampsia, severe 2012   lead to C section.  . Pregnancy induced hypertension   . Trichomonas   . Yeast infection     Patient Active Problem List   Diagnosis Date Noted  . Chlamydia 01/08/2019  . Abnormal uterine bleeding (AUB) 01/02/2019  . Acute pain of left shoulder 09/11/2017  . Bug bite 03/21/2017  . Nexplanon in place 09/13/2015  . Healthcare maintenance 09/13/2015  . Well woman exam with routine gynecological exam 05/05/2012  . Skin lesion of left arm 08/28/2011  . ASCUS on Pap smear 11/07/2010  . Genital herpes 06/19/2010  . TOBACCO USER 02/19/2009  . HTN (hypertension), benign 01/28/2008  . OBESITY, CLASS III 08/01/2006    Past Surgical History:  Procedure Laterality Date  . CESAREAN SECTION  05/03/2011   Procedure: CESAREAN SECTION;  Surgeon: Eli Hose, MD;  Location: [facility];  Service: Gynecology;  Laterality: N/A;  . CHOLECYSTECTOMY  07/05/2011   Procedure: LAPAROSCOPIC CHOLECYSTECTOMY WITH INTRAOPERATIVE  CHOLANGIOGRAM;  Surgeon: Adin Hector, MD;  Location: [facility];  Service: General;  Laterality: N/A;  . ERCP  07/09/2011   Procedure: ENDOSCOPIC RETROGRADE CHOLANGIOPANCREATOGRAPHY (ERCP);  Surgeon: Inda Castle, MD;  Location: [facility];  Service: Endoscopy;  Laterality: N/A;     OB History    Gravida  1   Para  1   Term  0   Preterm  1   AB  0   Living  1     SAB  0   TAB  0   Ectopic  0   Multiple  0   Live Births  1            Home Medications    Prior to Admission medications   Medication Sig Start Date End Date Taking? Authorizing Provider  acetaminophen (TYLENOL) 325 MG tablet Take 2 tablets (650 mg total) by mouth every 6 (six) hours as needed. Do not take more than 4000mg  of tylenol per day 09/06/17   Couture, Cortni S, PA-C  acyclovir (ZOVIRAX) 400 MG tablet TAKE 1 TABLET BY MOUTH ONCE DAILY AS NEEDED FOR  OUTBREAK 01/02/19   Bonnita Hollow, MD  etonogestrel (NEXPLANON) 68 MG IMPL implant Inject 1 each (68 mg total) into the skin once. 09/03/12   Cletus Gash, MD  ibuprofen (ADVIL) 800 MG tablet Take 1 tablet (800 mg total) by mouth 3 (three) times daily. 02/19/19   Quintella Reichert, MD  lidocaine (XYLOCAINE) 2 % solution Use as directed 20 mLs in the mouth or throat as needed for mouth pain. 05/17/17   Caccavale, Sophia, PA-C  medroxyPROGESTERone (PROVERA) 10 MG tablet Take 1 tablet (10 mg total) by mouth daily. 01/02/19   Oralia Manis, DO  methocarbamol (ROBAXIN) 500 MG tablet Take 1 tablet (500 mg total) by mouth 2 (two) times daily. 09/06/17   Couture, Cortni S, PA-C  valACYclovir (VALTREX) 1000 MG tablet Take 1 tablet (1,000 mg total) by mouth daily. 01/02/19   Oralia Manis, DO    Family History Family History  Problem Relation Age of Onset  . Diabetes Mother   . Obesity Mother   . Asthma Sister   . Obesity Sister   . Diabetes Maternal Aunt   . Obesity Maternal Aunt   . Diabetes Maternal Grandmother   . Anesthesia problems Neg Hx      Social History Social History   Tobacco Use  . Smoking status: Current Every Day Smoker    Years: 2.00    Types: Cigarettes    Last attempt to quit: 10/13/2010    Years since quitting: 8.3  . Smokeless tobacco: Never Used  Substance Use Topics  . Alcohol use: No  . Drug use: No     Allergies   Patient has no known allergies.   Review of Systems Review of Systems  All other systems reviewed and are negative.    Physical Exam Updated Vital Signs BP (!) 142/41   Pulse 86   Temp 98.6 F (37 C)   Resp 18   Ht 5\' 11"  (1.803 m)   Wt (!) 140.6 kg   SpO2 99%   BMI 43.24 kg/m   Physical Exam Vitals signs and nursing note reviewed.  Constitutional:      Appearance: She is well-developed.  HENT:     Head: Normocephalic and atraumatic.  Cardiovascular:     Rate and Rhythm: Normal rate and regular rhythm.     Heart sounds: No murmur.  Pulmonary:     Effort: Pulmonary effort is normal. No respiratory distress.     Breath sounds: Normal breath sounds.  Musculoskeletal:     Comments: Tenderness palpation over the left deltoid muscle. 2+ radial pulses bilaterally. Five out of five grip strength bilaterally. There is pain with range of motion of the left shoulder. Unable to fully abduct. She is able to cross midline but requires assistance with her other arm secondary to pain.  Skin:    General: Skin is warm and dry.  Neurological:     Mental Status: She is alert and oriented to person, place, and time.  Psychiatric:        Behavior: Behavior normal.      ED Treatments / Results  Labs (all labs ordered are listed, but only abnormal results are displayed) Labs Reviewed - No data to display  EKG None  Radiology Dg Shoulder Left  Result Date: 02/19/2019 CLINICAL DATA:  Left shoulder pain and limited range of motion. EXAM: LEFT SHOULDER - 2+ VIEW COMPARISON:  None. FINDINGS: There is calcific tendinopathy of the distal rotator cuff. The bones appear normal.  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 Otherwise, normal exam. Electronically Signed   By: Francene Boyers M.D.   On: 02/19/2019 22:07    Procedures Procedures (including critical care time)  Medications Ordered in ED Medications  ibuprofen (ADVIL) tablet 800 mg (has no administration in time range)     Initial Impression /  Assessment and Plan / ED Course  I have reviewed the triage vital signs and the nursing notes.  Pertinent labs & imaging results that were available during my care of the patient were reviewed by me and considered in my medical decision making (see chart for details).        Patient here for evaluation of atraumatic left shoulder pain. She is neurovascular early intact on examination. Patient with likely tendinitis versus rotator cuff tear. Discussed with patient home care with anti-inflammatories, rest. Discussed orthopedic follow-up and return precautions.  Final Clinical Impressions(s) / ED Diagnoses   Final diagnoses:  Acute pain of left shoulder    ED Discharge Orders         Ordered    ibuprofen (ADVIL) 800 MG tablet  3 times daily     02/19/19 2224           Tilden Fossaees, Nature Kueker, MD 02/19/19 2227

## 2019-02-19 NOTE — ED Triage Notes (Addendum)
Pt c/o left upper arm pain x 6 days, denies injury

## 2019-02-26 ENCOUNTER — Ambulatory Visit: Payer: Medicaid Other | Admitting: Family Medicine

## 2019-02-26 NOTE — Progress Notes (Deleted)
Andrea Morton - 28 y.o. female MRN [id number]  Date of birth: [birth year]  SUBJECTIVE:  Including CC & ROS.  No chief complaint on file.   Andrea Morton is a 28 y.o. female that is  ***.  ***   Review of Systems  HISTORY: Past Medical, Surgical, Social, and Family History Reviewed & Updated per EMR.   Pertinent Historical Findings include:  Past Medical History:  Diagnosis Date  . H/O varicella   . Herpes, genital   . Hypertension   . Obesity   . Pancreatitis   . Preeclampsia, severe 2012   lead to C section.  . Pregnancy induced hypertension   . Trichomonas   . Yeast infection     Past Surgical History:  Procedure Laterality Date  . CESAREAN SECTION  05/03/2011   Procedure: CESAREAN SECTION;  Surgeon: Janine Limbo, MD;  Location: [facility];  Service: Gynecology;  Laterality: N/A;  . CHOLECYSTECTOMY  07/05/2011   Procedure: LAPAROSCOPIC CHOLECYSTECTOMY WITH INTRAOPERATIVE CHOLANGIOGRAM;  Surgeon: Ernestene Mention, MD;  Location: [facility];  Service: General;  Laterality: N/A;  . ERCP  07/09/2011   Procedure: ENDOSCOPIC RETROGRADE CHOLANGIOPANCREATOGRAPHY (ERCP);  Surgeon: Louis Meckel, MD;  Location: [facility];  Service: Endoscopy;  Laterality: N/A;    No Known Allergies  Family History  Problem Relation Age of Onset  . Diabetes Mother   . Obesity Mother   . Asthma Sister   . Obesity Sister   . Diabetes Maternal Aunt   . Obesity Maternal Aunt   . Diabetes Maternal Grandmother   . Anesthesia problems Neg Hx      Social History   Socioeconomic History  . Marital status: Single    Spouse name: Not on file  . Number of children: Not on file  . Years of education: Not on file  . Highest education level: Not on file  Occupational History  . Not on file  Social Needs  . Financial resource strain: Not on file  . Food insecurity    Worry: Not on file    Inability: Not on file  . Transportation needs    Medical: Not on file    Non-medical: Not on  file  Tobacco Use  . Smoking status: Current Every Day Smoker    Years: 2.00    Types: Cigarettes    Last attempt to quit: 10/13/2010    Years since quitting: 8.3  . Smokeless tobacco: Never Used  Substance and Sexual Activity  . Alcohol use: No  . Drug use: No  . Sexual activity: Not on file  Lifestyle  . Physical activity    Days per week: Not on file    Minutes per session: Not on file  . Stress: Not on file  Relationships  . Social Musician on phone: Not on file    Gets together: Not on file    Attends religious service: Not on file    Active member of club or organization: Not on file    Attends meetings of clubs or organizations: Not on file    Relationship status: Not on file  . Intimate partner violence    Fear of current or ex partner: Not on file    Emotionally abused: Not on file    Physically abused: Not on file    Forced sexual activity: Not on file  Other Topics Concern  . Not on file  Social History Narrative   Unemployed, finished high school.  1 child- 2 months old.    No exercise.    Doesn't eat a healthy diet.      PHYSICAL EXAM:  VS: There were no vitals taken for this visit. Physical Exam Gen: NAD, alert, cooperative with exam, well-appearing ENT: normal lips, normal nasal mucosa,  Eye: normal EOM, normal conjunctiva and lids CV:  no edema, +2 pedal pulses   Resp: no accessory muscle use, non-labored,  GI: no masses or tenderness, no hernia  Skin: no rashes, no areas of induration  Neuro: normal tone, normal sensation to touch Psych:  normal insight, alert and oriented MSK:  ***      ASSESSMENT & PLAN:   No problem-specific Assessment & Plan notes found for this encounter.

## 2019-03-02 ENCOUNTER — Encounter: Payer: Medicaid Other | Admitting: Family Medicine

## 2019-03-02 ENCOUNTER — Encounter: Payer: Self-pay | Admitting: Family Medicine

## 2019-03-02 NOTE — Progress Notes (Signed)
Patient did not keep appointment today. She may call to reschedule.  

## 2019-11-05 ENCOUNTER — Other Ambulatory Visit: Payer: Self-pay

## 2019-11-05 ENCOUNTER — Ambulatory Visit (INDEPENDENT_AMBULATORY_CARE_PROVIDER_SITE_OTHER): Payer: Self-pay | Admitting: Family Medicine

## 2019-11-05 ENCOUNTER — Encounter: Payer: Self-pay | Admitting: Family Medicine

## 2019-11-05 VITALS — BP 110/66 | Wt 281.0 lb

## 2019-11-05 DIAGNOSIS — Z9884 Bariatric surgery status: Secondary | ICD-10-CM

## 2019-11-05 DIAGNOSIS — Z3200 Encounter for pregnancy test, result unknown: Secondary | ICD-10-CM

## 2019-11-05 DIAGNOSIS — Z30011 Encounter for initial prescription of contraceptive pills: Secondary | ICD-10-CM | POA: Insufficient documentation

## 2019-11-05 LAB — POCT URINE PREGNANCY: Preg Test, Ur: NEGATIVE

## 2019-11-05 MED ORDER — NORGESTIMATE-ETH ESTRADIOL 0.25-35 MG-MCG PO TABS: 1.0000 | ORAL_TABLET | Freq: Every day | ORAL | 11 refills | Status: AC

## 2019-11-05 NOTE — Patient Instructions (Signed)
Pregnancy test was negative.  We are referring you to bariatric surgery for further discussions.  We are prescribing you birth control pills for contraception.  Please schedule an appointment to get your Nexplanon reinserted.

## 2019-11-05 NOTE — Assessment & Plan Note (Signed)
Upreg negative. OCPs. Patient to call and schedule to have her nexplanon removed and switched.

## 2019-11-05 NOTE — Assessment & Plan Note (Signed)
Referral to bariatric surgery for check up and further discussion.

## 2019-11-05 NOTE — Progress Notes (Signed)
° ° °  SUBJECTIVE:   CHIEF COMPLAINT / HPI:   Amenorrhea Patient states that she missed her period in May.  She is has history of regular cycles.  Has the Nexplanon in but it is expired.  She is sexually active.  She is taking multiple pregnancy tests at home other than negative. She would like to have birth control pills for contraception.   History of gastric bypass Patient had a gastric bypass surgery done in [place].  Patient feels that she is gaining weight and is curious to explore options about possible surgical revision versus medication.  She is interested in seeing the bariatric specialist.  She has not seen a specialist for follow-up since the surgery.   OBJECTIVE:   BP 110/66    Wt 281 lb (127.5 kg)    LMP 09/17/2019    BMI 39.19 kg/m   Gen: NAD, resting comfortably Pulm: NWOB,  Skin: warm, dry Neuro: grossly normal, moves all extremities Psych: Normal affect and thought content   ASSESSMENT/PLAN:   Encounter for initial prescription of contraceptive pills Upreg negative. OCPs. Patient to call and schedule to have her nexplanon removed and switched.   History of gastric bypass Referral to bariatric surgery for check up and further discussion.      Garnette Gunner, MD [facility]

## 2020-05-10 IMAGING — CR DG SHOULDER 2+V*L*
3 series · 3 of 3 positions shown · non-contrast
Comparison: None.

CLINICAL DATA: Left shoulder pain and limited range of motion.

EXAM:
LEFT SHOULDER - 2+ VIEW

[w shoulder grashey left]
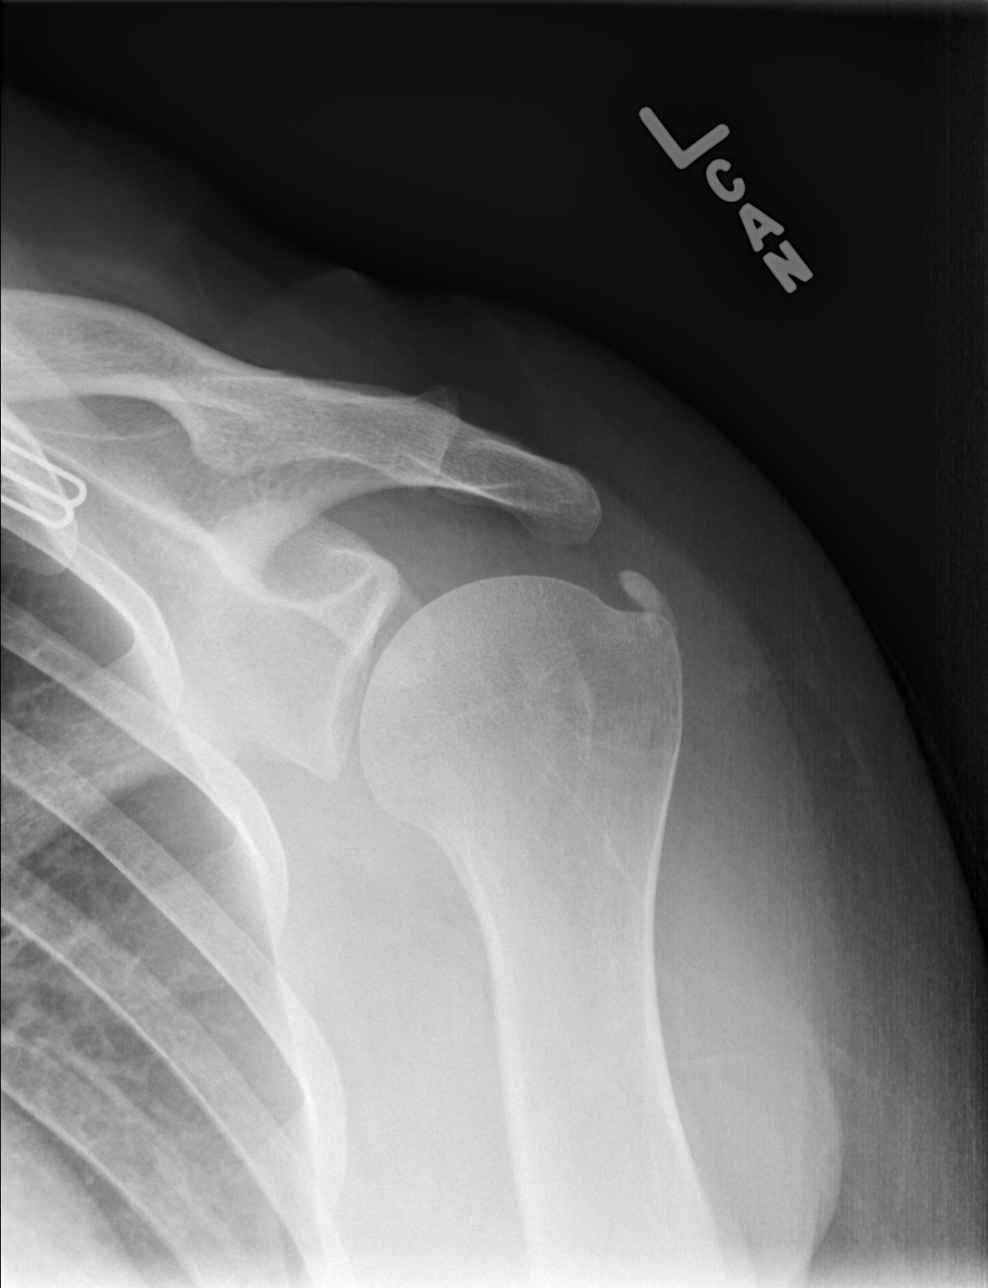

[w shoulder y view left *]
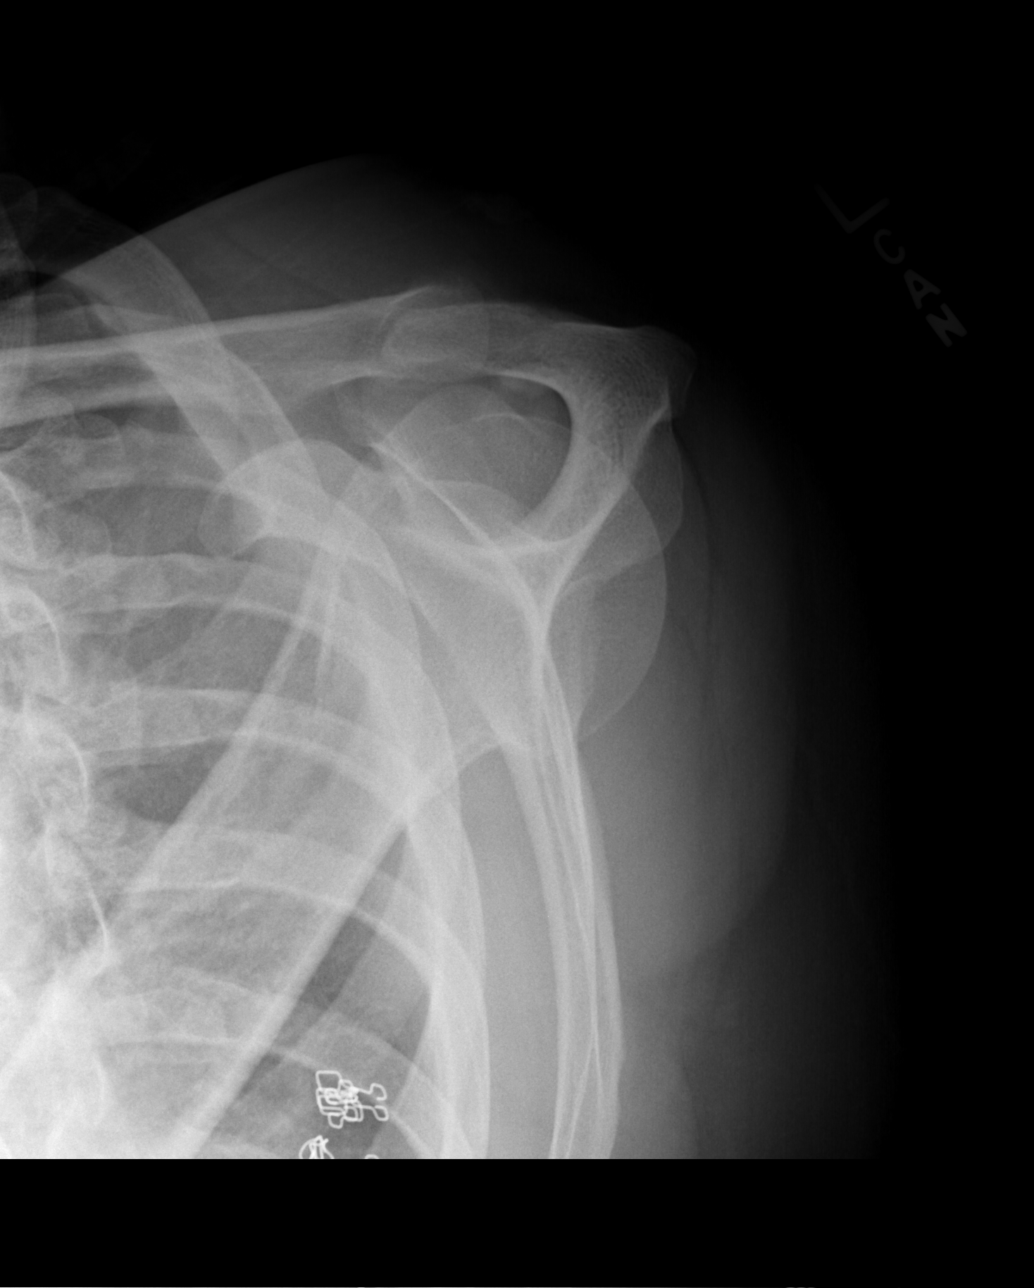

[w shoulder axillary left *]
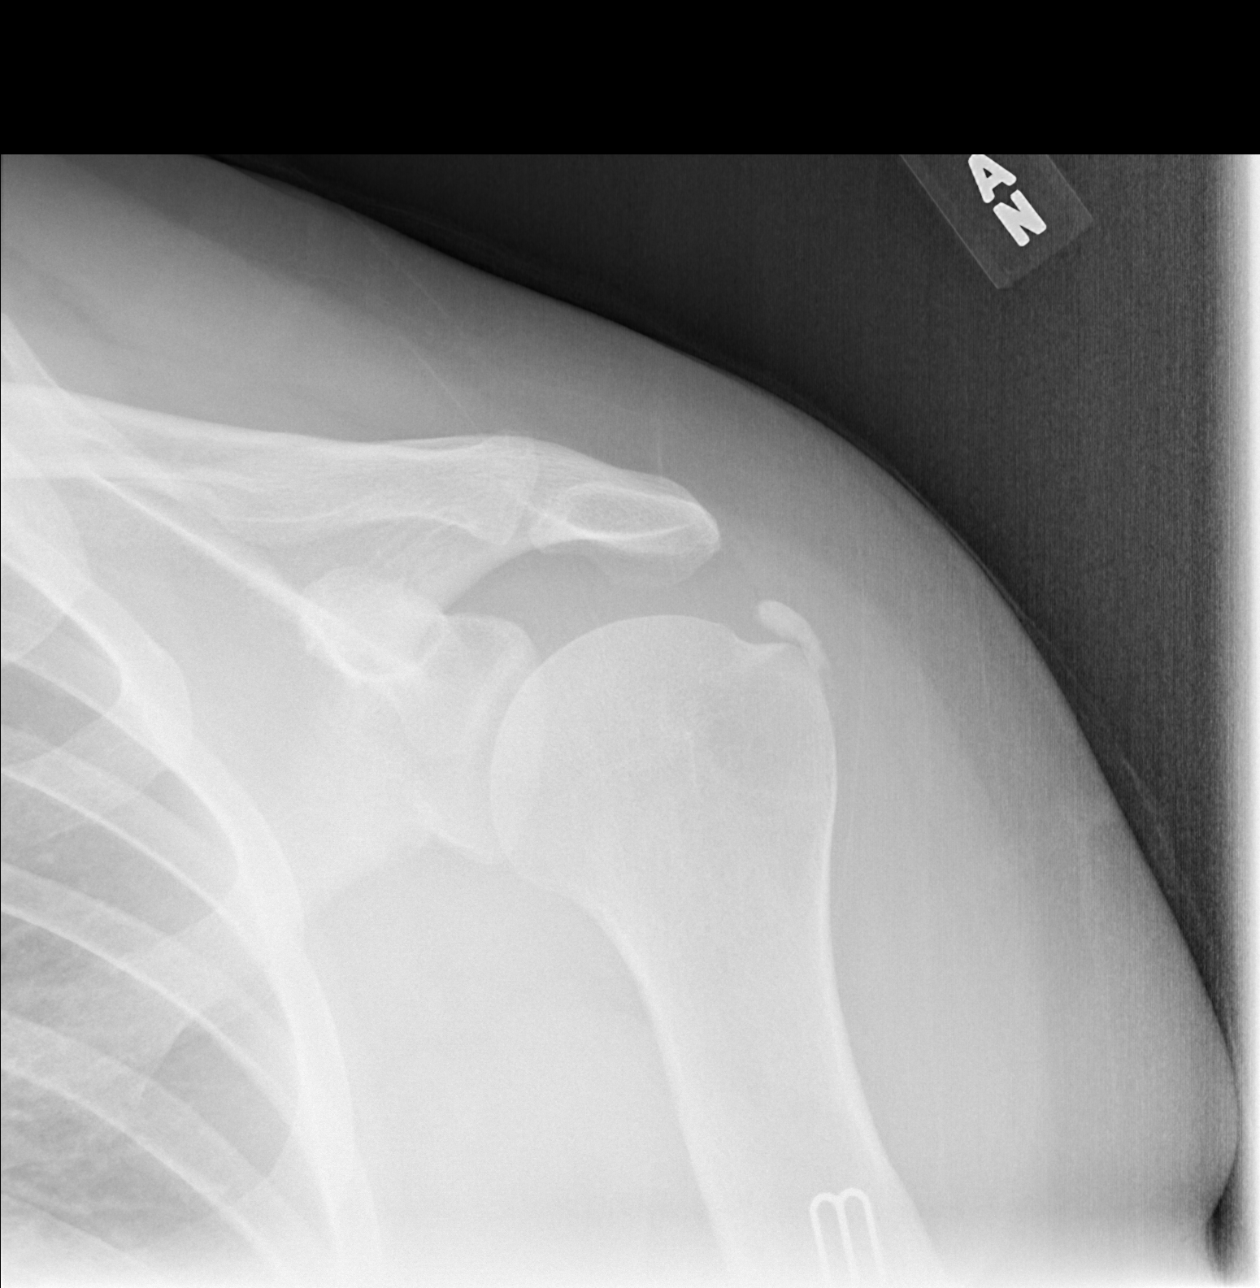

[3 of 3 positions shown; findings below may reference images not displayed]

FINDINGS: There is calcific tendinopathy of the distal rotator cuff. The bones
appear normal.
IMPRESSION: Calcific tendinopathy of the distal rotator cuff. Otherwise, normal
exam.

## 2020-05-11 ENCOUNTER — Telehealth: Payer: Self-pay

## 2020-05-11 ENCOUNTER — Other Ambulatory Visit: Payer: Self-pay

## 2020-05-11 ENCOUNTER — Ambulatory Visit (INDEPENDENT_AMBULATORY_CARE_PROVIDER_SITE_OTHER): Payer: Medicaid Other

## 2020-05-11 DIAGNOSIS — Z111 Encounter for screening for respiratory tuberculosis: Secondary | ICD-10-CM | POA: Diagnosis not present

## 2020-05-11 NOTE — Telephone Encounter (Signed)
Staff Health Assessent/Medical Report form dropped off for at front desk for completion.  Verified that patient section of form has been completed.  Last DOS/WCC with PCP was 12/0/21.  Placed form in team folder to be completed by clinical staff.  [employer]

## 2020-05-11 NOTE — Progress Notes (Signed)
Tuberculin skin test applied to left ventral forearm.  Patient to return to nurse clinic on 12/10 to have site read.  Reminder card given.

## 2020-05-12 NOTE — Telephone Encounter (Signed)
Called patient and informed her that she will need an appt to have this form completed.  Her last visit was earlier in the year and non related to a physical exam.  Patient voiced understanding and appt made for 05/13/20 at 10am.  Will place form in Dr. Cardell Peach box until she comes in for this appt.  Ewald Beg,CMA

## 2020-05-13 ENCOUNTER — Other Ambulatory Visit: Payer: Self-pay

## 2020-05-13 ENCOUNTER — Ambulatory Visit (INDEPENDENT_AMBULATORY_CARE_PROVIDER_SITE_OTHER): Payer: Medicaid Other | Admitting: Family Medicine

## 2020-05-13 ENCOUNTER — Ambulatory Visit: Payer: Medicaid Other

## 2020-05-13 ENCOUNTER — Other Ambulatory Visit (HOSPITAL_COMMUNITY)
Admission: RE | Admit: 2020-05-13 | Discharge: 2020-05-13 | Disposition: A | Discharge status: Home / Self Care | Payer: Medicaid Other | Source: Ambulatory Visit | Attending: Family Medicine | Admitting: Family Medicine

## 2020-05-13 VITALS — BP 102/60 | HR 72 | Ht 71.0 in | Wt 286.0 lb

## 2020-05-13 DIAGNOSIS — R5383 Other fatigue: Secondary | ICD-10-CM

## 2020-05-13 DIAGNOSIS — N898 Other specified noninflammatory disorders of vagina: Secondary | ICD-10-CM

## 2020-05-13 DIAGNOSIS — Z124 Encounter for screening for malignant neoplasm of cervix: Secondary | ICD-10-CM | POA: Diagnosis not present

## 2020-05-13 DIAGNOSIS — B9689 Other specified bacterial agents as the cause of diseases classified elsewhere: Secondary | ICD-10-CM | POA: Diagnosis not present

## 2020-05-13 DIAGNOSIS — Z131 Encounter for screening for diabetes mellitus: Secondary | ICD-10-CM

## 2020-05-13 DIAGNOSIS — Z975 Presence of (intrauterine) contraceptive device: Secondary | ICD-10-CM | POA: Diagnosis not present

## 2020-05-13 DIAGNOSIS — N760 Acute vaginitis: Secondary | ICD-10-CM | POA: Diagnosis not present

## 2020-05-13 LAB — POCT WET PREP (WET MOUNT)
Clue Cells Wet Prep Whiff POC: POSITIVE
Trichomonas Wet Prep HPF POC: ABSENT

## 2020-05-13 MED ORDER — METRONIDAZOLE 500 MG PO TABS: 500.0000 mg | ORAL_TABLET | Freq: Two times a day (BID) | ORAL | 0 refills | Status: AC

## 2020-05-13 NOTE — Assessment & Plan Note (Addendum)
She is interested in focusing on losing weight.  She would like to be connected with a gym membership and nutrition.  We discussed the provider referral exercise program and she was interested in this program.  She was informed that she would have to spend $100 for the membership because she is not a part of the [facility].  Despite fee, she remains interested.  Sadly, semaglutide for weight loss remains prohibitively expensive for those without private experience.  She was informed that I do not prescribe phentermine for weight loss. -Placed referral to the provider referral exercise program -Contact information for nutrition (Dr. Gerilyn Pilgrim) sent via MyChart

## 2020-05-13 NOTE — Assessment & Plan Note (Signed)
Placed 09/2015.  It is now ineffective and should be removed.  She was informed that she should schedule an appointment to have this removed.

## 2020-05-13 NOTE — Assessment & Plan Note (Signed)
-  Metronidazole 500 mg twice daily 7 days sent to pharmacy -Follow-up GC/chlamydia, HIV, RPR

## 2020-05-13 NOTE — Patient Instructions (Signed)
Fatigue: We will get some basic labs to look for vitamin deficiencies and, causes fatigue.  Weight loss: I have put in an order for the provider referred exercise program.  Should get a call in the next 1-2 weeks about getting set up with this program.  It will cost $100.  I recommend that you also call Dr. Gerilyn Pilgrim to get set up with regular nutrition counseling for weight loss.  Health maintenance: I will let you know if there any findings from your Pap smear or other labs.

## 2020-05-13 NOTE — Assessment & Plan Note (Signed)
Symptoms are vague.  We will start with the basic lab work-up. -Follow-up TSH, CBC, B12.

## 2020-05-13 NOTE — Progress Notes (Signed)
    SUBJECTIVE:   CHIEF COMPLAINT / HPI:   Vaginal Discharge: Patient is a 29 y.o. female presenting with vaginal discharge for 3 days.  She states the discharge is of thin consistency.  She endorses positive vaginal odor.  She is interested in screening for sexually transmitted infections today.  Fatigue She notes that she has been having low energy for the past several months.  She wonders if this is related to having low vitamin levels.  She is aware that she has a history of gastric bypass which makes her prone to having B vitamin deficiencies and she has not been taking B vitamin supplements as she was supposed to.  She is interested in having lab work done to work-up her symptoms of fatigue.  Obesity She is aware that she is overweight and she is interested in medication or exercise regimens that may help her lose weight.  She is specifically interested in phentermine.  PERTINENT  PMH / PSH: obesity  OBJECTIVE:   BP 102/60   Pulse 72   Ht 5\' 11"  (1.803 m)   Wt 286 lb (129.7 kg)   LMP 04/18/2020   SpO2 98%   BMI 39.89 kg/m    General: NAD, pleasant, able to participate in exam Respiratory: Normal effort, no obvious respiratory distress Pelvic: VULVA: normal appearing vulva with no masses, tenderness or lesions, VAGINA: Normal appearing vagina with normal color, no lesions, with scant discharge present, CERVIX: No lesions, no discharge present.  Chaperone 04/20/2020 present for pelvic exam  ASSESSMENT/PLAN:   Bacterial vaginosis -Metronidazole 500 mg twice daily 7 days sent to pharmacy -Follow-up GC/chlamydia, HIV, RPR  OBESITY, CLASS III She is interested in focusing on losing weight.  She would like to be connected with a gym membership and nutrition.  We discussed the provider referral exercise program and she was interested in this program.  She was informed that she would have to spend $100 for the membership because she is not a part of the [facility].   Despite fee, she remains interested.  Sadly, semaglutide for weight loss remains prohibitively expensive for those without private experience.  She was informed that I do not prescribe phentermine for weight loss. -Placed referral to the provider referral exercise program -Contact information for nutrition (Dr. Clabe Seal) sent via MyChart  Fatigue Symptoms are vague.  We will start with the basic lab work-up. -Follow-up TSH, CBC, B12.  Nexplanon in place Placed 09/2015.  It is now ineffective and should be removed.  She was informed that she should schedule an appointment to have this removed.     10/2015, MD [facility]

## 2020-05-17 LAB — CYTOLOGY - PAP
Adequacy: ABSENT
Chlamydia: NEGATIVE
Comment: NEGATIVE
Comment: NEGATIVE
Comment: NORMAL
Diagnosis: NEGATIVE
High risk HPV: NEGATIVE
Neisseria Gonorrhea: NEGATIVE

## 2020-10-04 ENCOUNTER — Emergency Department (HOSPITAL_BASED_OUTPATIENT_CLINIC_OR_DEPARTMENT_OTHER)
Admission: EM | Admit: 2020-10-04 | Discharge: 2020-10-04 | Disposition: A | Discharge status: Home / Self Care | Payer: Medicaid Other | Attending: Emergency Medicine | Admitting: Emergency Medicine

## 2020-10-04 ENCOUNTER — Other Ambulatory Visit: Payer: Self-pay

## 2020-10-04 ENCOUNTER — Encounter (HOSPITAL_BASED_OUTPATIENT_CLINIC_OR_DEPARTMENT_OTHER): Payer: Self-pay

## 2020-10-04 DIAGNOSIS — I10 Essential (primary) hypertension: Secondary | ICD-10-CM | POA: Insufficient documentation

## 2020-10-04 DIAGNOSIS — F17210 Nicotine dependence, cigarettes, uncomplicated: Secondary | ICD-10-CM | POA: Diagnosis not present

## 2020-10-04 DIAGNOSIS — K0889 Other specified disorders of teeth and supporting structures: Secondary | ICD-10-CM | POA: Insufficient documentation

## 2020-10-04 MED ORDER — KETOROLAC TROMETHAMINE 15 MG/ML IJ SOLN
30.0000 mg | Freq: Once | INTRAMUSCULAR | Status: AC
  Administered 2020-10-04: 30 mg via INTRAMUSCULAR
  Filled 2020-10-04: qty 2

## 2020-10-04 MED ORDER — BUPIVACAINE-EPINEPHRINE (PF) 0.5% -1:200000 IJ SOLN
1.8000 mL | Freq: Once | INTRAMUSCULAR | Status: AC
  Administered 2020-10-04: 1.8 mL
  Filled 2020-10-04: qty 1.8

## 2020-10-04 MED ORDER — PENICILLIN V POTASSIUM 500 MG PO TABS: 500.0000 mg | ORAL_TABLET | Freq: Four times a day (QID) | ORAL | 0 refills | Status: AC

## 2020-10-04 MED ORDER — IBUPROFEN 600 MG PO TABS: 600.0000 mg | ORAL_TABLET | Freq: Four times a day (QID) | ORAL | 0 refills | Status: AC | PRN

## 2020-10-04 MED ORDER — OXYCODONE-ACETAMINOPHEN 5-325 MG PO TABS
1.0000 | ORAL_TABLET | Freq: Once | ORAL | Status: AC
Start: 2020-10-04 — End: 2020-10-04
  Administered 2020-10-04: 1 via ORAL
  Filled 2020-10-04: qty 1

## 2020-10-04 NOTE — Discharge Instructions (Addendum)
Take antibiotics as instructed.  Follow-up with your dentist.  Take ibuprofen every 6 hours for the next 2 to 3 days scheduled.

## 2020-10-04 NOTE — ED Triage Notes (Signed)
Patient states she is having dental pain on right upper side and nothing makes it better. Patient took, tylenol, advil, and morphine for pain before arriving.

## 2020-10-04 NOTE — ED Provider Notes (Signed)
[facility] EMERGENCY DEPARTMENT Provider Note   CSN: [id number] Arrival date & time: 10/04/20  1027     History Chief Complaint  Patient presents with  . Dental Pain    Andrea Morton is a 30 y.o. female.  HPI     This is a 30 year old female who presents with dental pain.  She reports onset of dental pain around 8 PM last night.  She reports that she has a bad tooth that needs to be pulled but she was unable to have it pulled at her last dental visit.  It is in the right upper premolar.  She rates 10 out of 10 pain.  She states she has taken Tylenol, ibuprofen, and her mother's oral morphine with no relief.  She describes the pain as throbbing.  No recent dental procedures.  Denies difficulty swallowing.  Denies fevers.  She states she has been unable to sleep.  Past Medical History:  Diagnosis Date  . H/O varicella   . Herpes, genital   . Hypertension   . Obesity   . Pancreatitis   . Preeclampsia, severe 2012   lead to C section.  . Pregnancy induced hypertension   . Trichomonas   . Yeast infection     Patient Active Problem List   Diagnosis Date Noted  . Bacterial vaginosis 05/13/2020  . Fatigue 05/13/2020  . Encounter for initial prescription of contraceptive pills 11/05/2019  . History of gastric bypass 11/05/2019  . Chlamydia 01/08/2019  . Abnormal uterine bleeding (AUB) 01/02/2019  . Acute pain of left shoulder 09/11/2017  . Bug bite 03/21/2017  . Nexplanon in place 09/13/2015  . Healthcare maintenance 09/13/2015  . Well woman exam with routine gynecological exam 05/05/2012  . Skin lesion of left arm 08/28/2011  . ASCUS on Pap smear 11/07/2010  . Genital herpes 06/19/2010  . TOBACCO USER 02/19/2009  . HTN (hypertension), benign 01/28/2008  . OBESITY, CLASS III 08/01/2006    Past Surgical History:  Procedure Laterality Date  . CESAREAN SECTION  05/03/2011   Procedure: CESAREAN SECTION;  Surgeon: Janine Limbo, MD;  Location: [facility] ORS;   Service: Gynecology;  Laterality: N/A;  . CHOLECYSTECTOMY  07/05/2011   Procedure: LAPAROSCOPIC CHOLECYSTECTOMY WITH INTRAOPERATIVE CHOLANGIOGRAM;  Surgeon: Ernestene Mention, MD;  Location: [facility] OR;  Service: General;  Laterality: N/A;  . ERCP  07/09/2011   Procedure: ENDOSCOPIC RETROGRADE CHOLANGIOPANCREATOGRAPHY (ERCP);  Surgeon: Louis Meckel, MD;  Location: [facility] OR;  Service: Endoscopy;  Laterality: N/A;     OB History    Gravida  1   Para  1   Term  0   Preterm  1   AB  0   Living  1     SAB  0   IAB  0   Ectopic  0   Multiple  0   Live Births  1           Family History  Problem Relation Age of Onset  . Diabetes Mother   . Obesity Mother   . Asthma Sister   . Obesity Sister   . Diabetes Maternal Aunt   . Obesity Maternal Aunt   . Diabetes Maternal Grandmother   . Anesthesia problems Neg Hx     Social History   Tobacco Use  . Smoking status: Current Every Day Smoker    Years: 2.00    Types: Cigarettes    Last attempt to quit: 10/13/2010    Years since quitting: 9.9  .  Smokeless tobacco: Never Used  Substance Use Topics  . Alcohol use: No  . Drug use: No    Home Medications Prior to Admission medications   Medication Sig Start Date End Date Taking? Authorizing Provider  ibuprofen (ADVIL) 600 MG tablet Take 1 tablet (600 mg total) by mouth every 6 (six) hours as needed. 10/04/20  Yes Ravin Bendall, Mayer Masker, MD  penicillin v potassium (VEETID) 500 MG tablet Take 1 tablet (500 mg total) by mouth 4 (four) times daily for 10 days. 10/04/20 10/14/20 Yes Hayes Czaja, Mayer Masker, MD  acetaminophen (TYLENOL) 325 MG tablet Take 2 tablets (650 mg total) by mouth every 6 (six) hours as needed. Do not take more than 4000mg  of tylenol per day 09/06/17   Couture, Cortni S, PA-C  acyclovir (ZOVIRAX) 400 MG tablet TAKE 1 TABLET BY MOUTH ONCE DAILY AS NEEDED FOR  OUTBREAK 01/02/19   01/04/19, MD  etonogestrel (NEXPLANON) 68 MG IMPL implant Inject 1 each (68 mg total) into  the skin once. 09/03/12   11/03/12, MD  lidocaine (XYLOCAINE) 2 % solution Use as directed 20 mLs in the mouth or throat as needed for mouth pain. 05/17/17   Caccavale, Sophia, PA-C  medroxyPROGESTERone (PROVERA) 10 MG tablet Take 1 tablet (10 mg total) by mouth daily. 01/02/19   01/04/19, DO  methocarbamol (ROBAXIN) 500 MG tablet Take 1 tablet (500 mg total) by mouth 2 (two) times daily. 09/06/17   Couture, Cortni S, PA-C  metroNIDAZOLE (FLAGYL) 500 MG tablet Take 1 tablet (500 mg total) by mouth 2 (two) times daily. 05/13/20   14/10/21, MD  norgestimate-ethinyl estradiol (SPRINTEC 28) 0.25-35 MG-MCG tablet Take 1 tablet by mouth daily. 11/05/19   01/05/20, MD  valACYclovir (VALTREX) 1000 MG tablet Take 1 tablet (1,000 mg total) by mouth daily. 01/02/19   01/04/19, DO    Allergies    Patient has no known allergies.  Review of Systems   Review of Systems  Constitutional: Negative for fever.  HENT: Positive for dental problem. Negative for facial swelling and trouble swallowing.   All other systems reviewed and are negative.   Physical Exam Updated Vital Signs BP (!) 134/96 (BP Location: Right Arm)   Pulse 76   Temp 98.4 F (36.9 C) (Oral)   Resp 19   LMP 09/15/2020   SpO2 99%   Physical Exam Vitals and nursing note reviewed.  Constitutional:      Appearance: She is well-developed. She is obese.     Comments: Tearful, nontoxic-appearing  HENT:     Head: Normocephalic and atraumatic.     Nose: Nose normal.     Mouth/Throat:     Comments: Multiple missing and extracted teeth, extensive decay and deterioration of the right premolar, Tooth #3, with tenderness to palpation along the gumline.  No palpable abscess, no trismus, no fullness noted under the tongue Eyes:     Pupils: Pupils are equal, round, and reactive to light.  Cardiovascular:     Rate and Rhythm: Normal rate and regular rhythm.  Pulmonary:     Effort: Pulmonary effort is normal. No  respiratory distress.  Musculoskeletal:     Cervical back: Neck supple.     Right lower leg: No edema.     Left lower leg: No edema.  Skin:    General: Skin is warm and dry.  Neurological:     Mental Status: She is alert and oriented to person, place, and time.  Psychiatric:  Mood and Affect: Mood normal.     ED Results / Procedures / Treatments   Labs (all labs ordered are listed, but only abnormal results are displayed) Labs Reviewed - No data to display  EKG None  Radiology No results found.  Procedures Dental Block  Date/Time: 10/04/2020 3:31 AM Performed by: Shon Baton, MD Authorized by: Shon Baton, MD   Consent:    Consent obtained:  Verbal   Consent given by:  Patient   Risks discussed:  Infection, nerve damage and pain Universal protocol:    Patient identity confirmed:  Verbally with patient Indications:    Indications: dental pain   Location:    Block type:  Posterior superior alveolar   Laterality:  Right Procedure details:    Needle gauge:  27 G   Anesthetic injected:  Bupivacaine 0.5% WITH epi   Injection procedure:  Anatomic landmarks identified, introduced needle and incremental injection Post-procedure details:    Outcome:  Anesthesia achieved   Procedure completion:  Tolerated     Medications Ordered in ED Medications  ketorolac (TORADOL) 15 MG/ML injection 30 mg (30 mg Intramuscular Given 10/04/20 0258)  oxyCODONE-acetaminophen (PERCOCET/ROXICET) 5-325 MG per tablet 1 tablet (1 tablet Oral Given 10/04/20 0257)  bupivacaine-epinephrine (MARCAINE W/ EPI) 0.5% -1:200000 injection 1.8 mL (1.8 mLs Infiltration Given 10/04/20 0259)    ED Course  I have reviewed the triage vital signs and the nursing notes.  Pertinent labs & imaging results that were available during my care of the patient were reviewed by me and considered in my medical decision making (see chart for details).    MDM Rules/Calculators/A&P                           Patient presents with dental pain.  She is overall nontoxic and vital signs are reassuring.  She has significant dental decay and multiple missing teeth.  No palpable abscess.  She is very tearful and appears to be in pain.  She was given Percocet and Toradol.  Dental block was performed with good anesthesia.  Discussed with patient that this would last for approximately 6 hours.  She will be started on penicillin for presumed underlying infection.  Recommend scheduled ibuprofen for the next 2 to 3 days.  She was given strict return precautions.  After history, exam, and medical workup I feel the patient has been appropriately medically screened and is safe for discharge home. Pertinent diagnoses were discussed with the patient. Patient was given return precautions.    Final Clinical Impression(s) / ED Diagnoses Final diagnoses:  Pain, dental    Rx / DC Orders ED Discharge Orders         Ordered    ibuprofen (ADVIL) 600 MG tablet  Every 6 hours PRN        10/04/20 0330    penicillin v potassium (VEETID) 500 MG tablet  4 times daily        10/04/20 0330           Eunique Balik, Mayer Masker, MD 10/04/20 [phone]

## 2021-08-10 NOTE — Progress Notes (Signed)
? ? ?  SUBJECTIVE:  ? ?CHIEF COMPLAINT / HPI:  ? ?Headaches ?Patient states started about 2 weeks ago as a migrane which seemed to last 2-3 days but since then have had smaller headaches daily. The only time she previously had headaches was when pregnant and during bouts of high blood pressure. She had a gastric sleeve in 2016 and has not had them since then until 2 weeks ago. She states she has gained 40-50 lbs over last 3 years. Headaches are in the front, bilaterally, no nausea or vomiting. She has been taking ibuprofen and tylenol alternating every 6 hours for about 2 weeks. Denies high caffeine intake, no daily alcohol, she had difficultly sleeping due to headaches which she states happen mostly at night.  No change in vision.  She does have a mild headache today she states.   ? ?Contraception management ?She has Nexplanon which has been in place since 2014.Negative pregnancy test today . ? ?OBJECTIVE:  ? ?BP 137/74   Pulse 82   Ht 5\' 11"  (1.803 m)   Wt 275 lb 4 oz (124.9 kg)   SpO2 100%   BMI 38.39 kg/m?   ? ?General: NAD, pleasant, able to participate in exam ?Respiratory: No respiratory distress ?Skin: warm and dry, no rashes noted ?Neuro: alert, no obvious focal deficits, CN II through XII intact.  Fine touch sensation intact in upper and lower extremes bilaterally, strength 5/5 in upper and lower extremities bilaterally.  No photophobia on ophthalmologic exam ?Psych: Normal affect and mood ? ?ASSESSMENT/PLAN:  ? ?Headaches: ?Headaches for 2 weeks.  Likely secondary to medication overuse that she has been taking ibuprofen every 6 hours for the past 2 weeks.  She has normal neurologic exam.  She denies any photophobia, nausea, vomiting [place] headaches occur.  They are frontal and bilateral.  Low suspicion for migraines.  Low suspicion for intracranial abnormality as cause of these headaches.  Discussed that she needs to reduce the medications and discontinue her ibuprofen.  We will start amitriptyline  25 mg nightly.  Follow-up in 1 month. ? ?Contraception management: ?She has an Nexplanon this been in place for almost 10 years.  We will schedule patient for our gyn clinic to have this removed.  Negative pregnancy test today. ? ? ? , DO ?[facility]  ? ? ? ?

## 2021-08-11 ENCOUNTER — Other Ambulatory Visit: Payer: Self-pay

## 2021-08-11 ENCOUNTER — Ambulatory Visit: Payer: Medicaid Other | Admitting: Family Medicine

## 2021-08-11 ENCOUNTER — Encounter: Payer: Self-pay | Admitting: Family Medicine

## 2021-08-11 VITALS — BP 137/74 | HR 82 | Ht 71.0 in | Wt 275.2 lb

## 2021-08-11 DIAGNOSIS — R109 Unspecified abdominal pain: Secondary | ICD-10-CM

## 2021-08-11 LAB — POCT URINE PREGNANCY: Preg Test, Ur: NEGATIVE

## 2021-08-11 MED ORDER — AMITRIPTYLINE HCL 25 MG PO TABS: 25.0000 mg | ORAL_TABLET | Freq: Every day | ORAL | 1 refills | Status: AC

## 2021-08-11 NOTE — Patient Instructions (Signed)
Your pregnancy test is negative.  We are scheduling appointment to get your Nexplanon removed. ? ?For your headaches I think these are due to medications.  We need to stop the ibuprofen.  I am starting a medication called amitriptyline for you to take each night.  Give it 2 or 3 days to kick in but it should help stop the headaches.  You need to have stopped the ibuprofen for this to really work well. ?

## 2021-08-24 ENCOUNTER — Ambulatory Visit: Payer: Medicaid Other

## 2021-09-16 NOTE — Progress Notes (Deleted)
? ? ?  SUBJECTIVE:  ? ?CHIEF COMPLAINT / HPI:  ? ?Headaches: ?Previously saw the patient on 08/11/2021 with complaints of headaches for about 2 weeks.  It was thought that this was likely secondary to medication overuse that she has been using ibuprofen about every 6 hours for the past 2 weeks.  She had a normal neurologic exam.  She denied any photophobia, nausea, vomiting.  Recommendation at the time was to reduce and decrease this ibuprofen use and start amitriptyline 25 mg nightly with follow-up in 1 month.  Today she states*** ? ?PERTINENT  PMH / PSH: *** ? ?OBJECTIVE:  ? ?There were no vitals taken for this visit. *** ? ?General: NAD, pleasant, able to participate in exam ?Cardiac: RRR, no murmurs. ?Respiratory: CTAB, normal effort, No wheezes, rales or rhonchi ?Abdomen: Bowel sounds present, nontender, nondistended, no hepatosplenomegaly. ?Extremities: no edema or cyanosis. ?Skin: warm and dry, no rashes noted ?Neuro: alert, no obvious focal deficits ?Psych: Normal affect and mood ? ?ASSESSMENT/PLAN:  ? ?No problem-specific Assessment & Plan notes found for this encounter. ?  ? ? ?Jackelyn Poling, DO ?[facility]  ? ? ?{    This will disappear when note is signed, click to select method of visit    :1} ?

## 2021-09-18 ENCOUNTER — Ambulatory Visit: Payer: Medicaid Other | Admitting: Family Medicine

## 2021-10-02 ENCOUNTER — Encounter: Payer: Self-pay | Admitting: Family Medicine

## 2021-10-02 ENCOUNTER — Ambulatory Visit: Payer: Medicaid Other | Admitting: Family Medicine

## 2021-10-02 VITALS — BP 133/78 | HR 82 | Ht 71.0 in | Wt 285.0 lb

## 2021-10-02 DIAGNOSIS — J029 Acute pharyngitis, unspecified: Secondary | ICD-10-CM

## 2021-10-02 LAB — POCT RAPID STREP A (OFFICE): Rapid Strep A Screen: NEGATIVE

## 2021-10-02 MED ORDER — PENICILLIN V POTASSIUM 500 MG PO TABS: 500.0000 mg | ORAL_TABLET | Freq: Three times a day (TID) | ORAL | 0 refills | Status: AC

## 2021-10-02 MED ORDER — DEXAMETHASONE 6 MG PO TABS: 10.0000 mg | ORAL_TABLET | Freq: Two times a day (BID) | ORAL | 0 refills | Status: AC

## 2021-10-02 MED ORDER — PHENOL 1.4 % MT LIQD: 1.0000 | OROMUCOSAL | 0 refills | Status: AC | PRN

## 2021-10-02 NOTE — Patient Instructions (Addendum)
Your strep throat test was negative, but I am concerned that this could possibly be bacterial infection (it could also be a viral infection).  I will send you in an antibiotic that you will take 3 times a day for 10 days.  I have also sent in Chloraseptic spray to help with your sore throat. ? ?The other medication I have sent in is a steroid called dexamethasone, I want you to take this as a one-time dose but only take 1.5 pills. ? ?If you are still unable to take anything like water over the next day or so then I recommend coming back to the clinic to get evaluated or going to the ER to make sure you are not showing signs of dehydration. ?

## 2021-10-02 NOTE — Progress Notes (Signed)
? ? ?  SUBJECTIVE:  ? ?CHIEF COMPLAINT / HPI:  ? ?Sore throat ?Patient reports significant sore throat with odynophagia for the last 2 to 3 days.  Her daughter recently had strep throat and had to be out of school for a week due to it.  Patient reports that she is having difficulty with swallowing even water due to the pain.  She has attempted salt water gargle rinses, Alka-Seltzer, Tylenol, ibuprofen, Mucinex but nothing has helped yet. ? ?PERTINENT  PMH / PSH: Reviewed ? ?OBJECTIVE:  ? ?BP 133/78   Pulse 82   Ht 5\' 11"  (1.803 m)   Wt 285 lb (129.3 kg)   SpO2 99%   BMI 39.75 kg/m?   ?Gen: well-appearing, NAD ?CV: RRR, no m/r/g appreciated, no peripheral edema ?Pulm: CTAB, no wheezes/crackles ?GI: soft, non-tender, non-distended ?HEENT: white exudate present on tonsils (R>L), erythematous pharynx, anterior cervical lymphadenopathy present, no uvular deviation noted ? ?ASSESSMENT/PLAN:  ? ?Severe sore throat ?Limiting ability to take in oral hydration, does not appear dehydrated at this time on exam. Severe symptoms with recent exposure to strep throat. Rapid strep test in clinic was negative. Still concern for bacterial infection vs viral, will send with Penicillin prescription and symptom control medications. Per discussion with Dr. and Dr. McDiarmid, dexamethasone one time dose of 10mg  orally can help with sore throat.  ?- Penicillin V 500mg  TID x10 days ?- Chloraseptic spray ?- Dexamethasone oral 10 mg x 1 dose ?- Strict return precautions given, especially if unable to adequately hydrate ? ? ?Andrea Kloth, DO ?[facility]  ?

## 2021-11-07 ENCOUNTER — Encounter: Payer: Self-pay | Admitting: *Deleted

## 2022-08-12 ENCOUNTER — Emergency Department (HOSPITAL_BASED_OUTPATIENT_CLINIC_OR_DEPARTMENT_OTHER)
Admission: EM | Admit: 2022-08-12 | Discharge: 2022-08-12 | Discharge status: Against Medical Advice | Payer: Medicaid Other | Attending: Emergency Medicine | Admitting: Emergency Medicine

## 2022-08-12 ENCOUNTER — Other Ambulatory Visit: Payer: Self-pay

## 2022-08-12 DIAGNOSIS — Z1152 Encounter for screening for COVID-19: Secondary | ICD-10-CM | POA: Diagnosis not present

## 2022-08-12 DIAGNOSIS — Z5321 Procedure and treatment not carried out due to patient leaving prior to being seen by health care provider: Secondary | ICD-10-CM | POA: Diagnosis not present

## 2022-08-12 DIAGNOSIS — J101 Influenza due to other identified influenza virus with other respiratory manifestations: Secondary | ICD-10-CM | POA: Diagnosis not present

## 2022-08-12 DIAGNOSIS — R059 Cough, unspecified: Secondary | ICD-10-CM | POA: Diagnosis present

## 2022-08-12 LAB — RESP PANEL BY RT-PCR (RSV, FLU A&B, COVID)  RVPGX2
Influenza A by PCR: NEGATIVE
Influenza B by PCR: POSITIVE — AB
Resp Syncytial Virus by PCR: NEGATIVE
SARS Coronavirus 2 by RT PCR: NEGATIVE

## 2022-08-12 LAB — GROUP A STREP BY PCR: Group A Strep by PCR: NOT DETECTED

## 2022-08-12 NOTE — ED Notes (Signed)
Pt tearful and stating she had a child at home she needed to get back to. She asked for a work note. No work note given, but pt was given a printed copy of her result to provide her employer. Pt encouraged to be seen by provider, but ultimately decided she needed to get to her child. EDP made aware. Strict return precautions given. Pt verbalized understanding.

## 2022-08-12 NOTE — ED Triage Notes (Addendum)
Pt arrives with c/o cough, congestion, headache, sore throat and generalized body aches that started Wednesday. Pt denies fevers.

## 2022-08-13 ENCOUNTER — Encounter: Payer: Self-pay | Admitting: Family Medicine

## 2022-08-13 ENCOUNTER — Ambulatory Visit: Payer: Medicaid Other | Admitting: Family Medicine

## 2022-08-13 VITALS — BP 125/85 | HR 85 | Ht 71.0 in | Wt 300.1 lb

## 2022-08-13 DIAGNOSIS — J101 Influenza due to other identified influenza virus with other respiratory manifestations: Secondary | ICD-10-CM | POA: Diagnosis present

## 2022-08-13 NOTE — Progress Notes (Signed)
    SUBJECTIVE:   CHIEF COMPLAINT / HPI:   Flu B - Tested positive in the ER for flu B - Left the ER before being seen as she had to get home to her daughter - Sick since last Tuesday but got worse on Friday - Had fever on Friday but no other fevers since  - Having sore throat and cough with changes in taste and smell from being sick - No longer having any nausea or vomiting - Is able to adequately hydrate though appetite diminished   PERTINENT  PMH / PSH: Reviewed  OBJECTIVE:   BP 125/85   Pulse 85   Ht 5\' 11"  (1.803 m)   Wt (!) 300 lb 2 oz (136.1 kg)   SpO2 100%   BMI 41.86 kg/m   Gen: appears mildly sick, NAD CV: RRR, no m/r/g appreciated, no peripheral edema Pulm: CTAB, no wheezes/crackles HEENT: TM clear bilaterally, no cervical LAD, no tonsillar exudates or pharyngeal erythema  ASSESSMENT/PLAN:   Influenza B Patient on day 7 of being sick, but given the worsening and fever on Friday I feel that she is likely on day 4 of current Flu illness. Patient tested positive in the ER for Flu B, but given it has been >48h since onset and there is no history of asthma, patient does not qualify for Tamiflu treatment. Patient's physical exam is overall reassuring and discussed conservative management at home. Anticipate improvement over the next 2-3 days and discussed return precautions.  Patient inquiring about antibiotics, but given lack of findings consistent with pneumonia or bacterial sinusitis, do not feel that antibiotics are appropriate, which was discussed. - Conservative management at home - Can return to work on Wednesday per patient request - Return precautions discussed  - Discussed why antibiotics are not appropriate at this time   Andrea Morton, [place]

## 2022-08-13 NOTE — Patient Instructions (Signed)
Unfortunately there isn't a medication that will get rid of your symptoms and you are out of the timeline window to get Tamiflu. For your sore throat and cough I would recommend using local honey and hot drinks to help with the pain and cough. You can use Mucinex as needed if it is helping. If you have not had a fever for 24h you can go back to work.

## 2022-09-17 ENCOUNTER — Encounter: Payer: Self-pay | Admitting: *Deleted
